# Patient Record
Sex: Female | Born: 1950
Health system: Southern US, Community
[De-identification: ages and names within clinical notes are randomized; demographics above are authoritative.]

## PROBLEM LIST (undated history)

## (undated) DIAGNOSIS — I10 Essential (primary) hypertension: Secondary | ICD-10-CM

## (undated) DIAGNOSIS — Z780 Asymptomatic menopausal state: Secondary | ICD-10-CM

## (undated) DIAGNOSIS — N261 Atrophy of kidney (terminal): Secondary | ICD-10-CM

## (undated) DIAGNOSIS — K222 Esophageal obstruction: Secondary | ICD-10-CM

## (undated) DIAGNOSIS — K611 Rectal abscess: Secondary | ICD-10-CM

## (undated) DIAGNOSIS — K5790 Diverticulosis of intestine, part unspecified, without perforation or abscess without bleeding: Secondary | ICD-10-CM

## (undated) DIAGNOSIS — N6019 Diffuse cystic mastopathy of unspecified breast: Secondary | ICD-10-CM

## (undated) DIAGNOSIS — K589 Irritable bowel syndrome without diarrhea: Secondary | ICD-10-CM

## (undated) DIAGNOSIS — N83209 Unspecified ovarian cyst, unspecified side: Secondary | ICD-10-CM

## (undated) DIAGNOSIS — K209 Esophagitis, unspecified without bleeding: Secondary | ICD-10-CM

## (undated) DIAGNOSIS — F419 Anxiety disorder, unspecified: Secondary | ICD-10-CM

## (undated) DIAGNOSIS — K635 Polyp of colon: Secondary | ICD-10-CM

## (undated) DIAGNOSIS — N280 Ischemia and infarction of kidney: Secondary | ICD-10-CM

## (undated) DIAGNOSIS — Z8719 Personal history of other diseases of the digestive system: Secondary | ICD-10-CM

## (undated) DIAGNOSIS — K5909 Other constipation: Secondary | ICD-10-CM

## (undated) DIAGNOSIS — K219 Gastro-esophageal reflux disease without esophagitis: Secondary | ICD-10-CM

## (undated) DIAGNOSIS — F32A Depression, unspecified: Secondary | ICD-10-CM

## (undated) DIAGNOSIS — M1990 Unspecified osteoarthritis, unspecified site: Secondary | ICD-10-CM

## (undated) DIAGNOSIS — M722 Plantar fascial fibromatosis: Secondary | ICD-10-CM

## (undated) DIAGNOSIS — N289 Disorder of kidney and ureter, unspecified: Secondary | ICD-10-CM

## (undated) DIAGNOSIS — J449 Chronic obstructive pulmonary disease, unspecified: Secondary | ICD-10-CM

## (undated) DIAGNOSIS — F329 Major depressive disorder, single episode, unspecified: Secondary | ICD-10-CM

## (undated) HISTORY — DX: Unspecified ovarian cyst, unspecified side: N83.209

## (undated) HISTORY — DX: Plantar fascial fibromatosis: M72.2

## (undated) HISTORY — DX: Asymptomatic menopausal state: Z78.0

## (undated) HISTORY — PX: DILATION AND CURETTAGE OF UTERUS: SHX78

## (undated) HISTORY — DX: Ischemia and infarction of kidney: N28.0

## (undated) HISTORY — DX: Irritable bowel syndrome, unspecified: K58.9

## (undated) HISTORY — DX: Polyp of colon: K63.5

## (undated) HISTORY — PX: EYE SURGERY: SHX253

## (undated) HISTORY — DX: Essential (primary) hypertension: I10

## (undated) HISTORY — PX: GASTROSTOMY: SHX151

## (undated) HISTORY — PX: BREAST BIOPSY: SHX20

---

## 2004-01-21 ENCOUNTER — Ambulatory Visit: Payer: Self-pay | Admitting: Internal Medicine

## 2004-01-29 ENCOUNTER — Ambulatory Visit: Payer: Self-pay | Admitting: Internal Medicine

## 2004-03-14 ENCOUNTER — Ambulatory Visit: Payer: Self-pay | Admitting: Internal Medicine

## 2004-07-03 ENCOUNTER — Ambulatory Visit: Payer: Self-pay

## 2004-07-28 ENCOUNTER — Ambulatory Visit: Payer: Self-pay

## 2004-09-02 ENCOUNTER — Ambulatory Visit: Payer: Self-pay

## 2004-12-09 ENCOUNTER — Ambulatory Visit: Payer: Self-pay | Admitting: Internal Medicine

## 2004-12-16 ENCOUNTER — Ambulatory Visit: Payer: Self-pay | Admitting: General Surgery

## 2004-12-17 ENCOUNTER — Ambulatory Visit: Payer: Self-pay | Admitting: General Surgery

## 2005-03-30 ENCOUNTER — Ambulatory Visit: Payer: Self-pay | Admitting: Internal Medicine

## 2006-02-11 ENCOUNTER — Ambulatory Visit: Payer: Self-pay | Admitting: Internal Medicine

## 2006-03-22 ENCOUNTER — Ambulatory Visit (HOSPITAL_COMMUNITY): Admission: RE | Admit: 2006-03-22 | Discharge: 2006-03-22 | Payer: Self-pay | Admitting: *Deleted

## 2006-03-31 ENCOUNTER — Ambulatory Visit: Payer: Self-pay | Admitting: Internal Medicine

## 2006-04-20 HISTORY — PX: CHOLECYSTECTOMY: SHX55

## 2007-02-07 ENCOUNTER — Ambulatory Visit: Payer: Self-pay | Admitting: Internal Medicine

## 2007-02-08 ENCOUNTER — Ambulatory Visit: Payer: Self-pay | Admitting: Internal Medicine

## 2007-02-21 ENCOUNTER — Ambulatory Visit: Payer: Self-pay | Admitting: Gastroenterology

## 2007-09-27 ENCOUNTER — Ambulatory Visit: Payer: Self-pay | Admitting: Internal Medicine

## 2008-08-08 ENCOUNTER — Encounter: Admission: RE | Admit: 2008-08-08 | Discharge: 2008-08-08 | Payer: Self-pay | Admitting: Neurosurgery

## 2009-03-01 ENCOUNTER — Ambulatory Visit: Payer: Self-pay | Admitting: Internal Medicine

## 2010-04-10 ENCOUNTER — Ambulatory Visit: Payer: Self-pay

## 2010-07-24 ENCOUNTER — Other Ambulatory Visit: Payer: Self-pay | Admitting: Podiatrist

## 2010-07-24 DIAGNOSIS — Z8781 Personal history of (healed) traumatic fracture: Secondary | ICD-10-CM

## 2010-07-31 ENCOUNTER — Other Ambulatory Visit: Payer: Self-pay | Admitting: Podiatrist

## 2010-07-31 ENCOUNTER — Ambulatory Visit
Admission: RE | Admit: 2010-07-31 | Discharge: 2010-07-31 | Disposition: A | Discharge status: Home / Self Care | Payer: BC Managed Care – PPO | Source: Ambulatory Visit | Attending: Podiatrist | Admitting: Podiatrist

## 2010-07-31 DIAGNOSIS — Z8781 Personal history of (healed) traumatic fracture: Secondary | ICD-10-CM

## 2010-07-31 MED ORDER — GADOBENATE DIMEGLUMINE 529 MG/ML IV SOLN
8.0000 mL | Freq: Once | INTRAVENOUS | Status: AC | PRN
  Administered 2010-07-31: 8 mL via INTRAVENOUS

## 2010-10-21 ENCOUNTER — Telehealth: Payer: Self-pay | Admitting: *Deleted

## 2010-10-21 MED ORDER — ESOMEPRAZOLE MAGNESIUM 40 MG PO CPDR: 40.0000 mg | DELAYED_RELEASE_CAPSULE | Freq: Every day | ORAL | Status: DC

## 2010-10-21 NOTE — Telephone Encounter (Signed)
Pt c/o diarrhea since on Dexalan, she d/c med x 2 weeks diarrhea stopped then she restarted it and diarrhea came back. Pt wants to try another med (mentioned Nexium) but preferably anything with a  with  generic.

## 2010-10-21 NOTE — Telephone Encounter (Signed)
Nexium rx sent.

## 2010-10-21 NOTE — Telephone Encounter (Signed)
Patient advised as instructed via telephone. 

## 2010-12-18 ENCOUNTER — Ambulatory Visit: Payer: Self-pay | Admitting: Rheumatology

## 2011-04-21 DIAGNOSIS — K635 Polyp of colon: Secondary | ICD-10-CM

## 2011-04-21 HISTORY — DX: Polyp of colon: K63.5

## 2011-10-18 ENCOUNTER — Ambulatory Visit: Payer: Self-pay | Admitting: Family Medicine

## 2011-11-17 ENCOUNTER — Ambulatory Visit: Payer: Self-pay | Admitting: Internal Medicine

## 2012-04-06 ENCOUNTER — Ambulatory Visit: Payer: BC Managed Care – PPO | Attending: Gynecologic Oncology | Admitting: Gynecologic Oncology

## 2012-04-06 ENCOUNTER — Telehealth: Payer: Self-pay | Admitting: Gynecologic Oncology

## 2012-04-06 ENCOUNTER — Encounter: Payer: Self-pay | Admitting: Gynecologic Oncology

## 2012-04-06 VITALS — BP 142/80 | HR 78 | Temp 97.8°F | Resp 22 | Ht 67.5 in | Wt 190.8 lb

## 2012-04-06 DIAGNOSIS — Z79899 Other long term (current) drug therapy: Secondary | ICD-10-CM | POA: Insufficient documentation

## 2012-04-06 DIAGNOSIS — F17200 Nicotine dependence, unspecified, uncomplicated: Secondary | ICD-10-CM | POA: Insufficient documentation

## 2012-04-06 DIAGNOSIS — N83209 Unspecified ovarian cyst, unspecified side: Secondary | ICD-10-CM | POA: Insufficient documentation

## 2012-04-06 DIAGNOSIS — N83299 Other ovarian cyst, unspecified side: Secondary | ICD-10-CM

## 2012-04-06 DIAGNOSIS — Z803 Family history of malignant neoplasm of breast: Secondary | ICD-10-CM | POA: Insufficient documentation

## 2012-04-06 NOTE — Telephone Encounter (Signed)
Called and spoke with the RN for Dr. Burney Gauze, the patient's primary care physician.  Informed that the patient would need to be arranged for an upper endoscopy and colonoscopy due to elevated CEA per Dr. Denman George recommendation.  Instructed to please fax the results to GYN Oncology.  Verbalizing understanding.

## 2012-04-06 NOTE — Patient Instructions (Signed)
Follow up with your endoscopy.

## 2012-04-06 NOTE — Progress Notes (Signed)
Consult Note: Gyn-Onc  Shannon Li 61 y.o. female  CC:  Chief Complaint  Patient presents with  . Complex Ovarian Cyst    New patient    HPI: Patient is seen today in consultation at the request of Dr. Juliene Pina.  Patient is a 61 year old gravida 0 who was seen by Dr. Juliene Pina December 3. Dr. Beverely Risen referred her for an ovarian cyst.  In June of 2013 the patient had a CT scan done at [facility] for presumed abdominal and right flank pain. She was noted to have mild atrophy of the right kidney for known renal artery stenosis. There is a 3 mm calculus in the distal right ureter. In addition there was a 1.6 cm cystic structure associated with the left ovary. There was a 1.1 cm cystic structure shows you with the right ovary. Both of these were compared to CT scan from October of 2008 and there were fairly unremarkable. There was mild diverticulosis. The appendix was normal. There is no evidence of any lymphadenopathy. She had followup ultrasound in August of 2013 which showed an anteverted uterus measuring 5.8 x 1.8 x 1.3 cm. The endometrial stripe measured 3 mm. The right ovary measured 2.6 x 2.3 x 1.5 cm with appropriate flow. The left ovary measured 3.3 x 2.5 x 2.3 cm with a possible septated complex cyst measuring 2.3 cm. There is no evidence of torsion. Repeat imaging November 19 again revealed complex left ovarian cyst measuring 2.6 x 2.8 cm. There is no free fluid. The right ovary was not seen. The cyst was not markedly enlarged. She was seen by her gynecologist and had tumor markers drawn which included a CA 125 of 12.5 and a CEA that was 8.4. The patient is a heavy smoker and the upper limit of normal for smokers is 5.6. It is for this reason that she comes in to see [place] today.  The patient states her colonoscopy back in about 2008.  Review of Systems: She has been menopausal since her 94s. She hormone replacement therapy about 2 years. She never had any bleeding.  Review of systems she has a blood in her stool or any vaginal bleeding. Her bowels voice been somewhat constipated and has been going on for a long time. If she takes Metamucil she just feels bloated without any significant relief in terms of her bowels. MiraLAX as not benefit her she occasionally takes laxatives. She 3-4 days between bowel movements. She's never noticed any blood in her stool. She did have one episode were were sheets and been had severe abdominal pain which resolved. She's not had any diarrhea as these last 2 days her stools have been somewhat loose. She states her stools are normal in caliber. She denies any weight changes any nausea, vomiting, fevers, chills, chest pain, shortness of breath. We discussed smoking cessation the patient is not particularly interested in smoking cessation. She denies any vaginal discharge. She has a change in her bladder habits. She denies any incontinence, hematuria or dysuria. She denies any unintentional weight loss or weight gain. She denies any early satiety. 10 point review of systems is otherwise negative.  Current Meds:  Outpatient Encounter Prescriptions as of 04/06/2012  Medication Sig Dispense Refill  . amLODipine (NORVASC) 2.5 MG tablet Take 2.5 mg by mouth daily.      . bisoprolol-hydrochlorothiazide (ZIAC) 2.5-6.25 MG per tablet Take 1 tablet by mouth daily.      Marland Kitchen escitalopram (LEXAPRO) 10 MG tablet Take 15  mg by mouth daily.      Marland Kitchen esomeprazole (NEXIUM) 40 MG capsule Take 1 capsule (40 mg total) by mouth daily.  30 capsule  1    Allergy:  Allergies  Allergen Reactions  . Penicillins Hives    Social Hx:   History   Social History  . Marital Status: Widowed    Spouse Name: N/A    Number of Children: N/A  . Years of Education: N/A   Occupational History  . Not on file.   Social History Main Topics  . Smoking status: Current Every Day Smoker -- 1.0 packs/day for 30 years    Types: Cigarettes  . Smokeless tobacco: Not on  file  . Alcohol Use: No  . Drug Use: No  . Sexually Active: Not on file   Other Topics Concern  . Not on file   Social History Narrative  . No narrative on file    Past Surgical Hx:  Past Surgical History  Procedure Date  . Cholecystectomy 2008    Past Medical Hx:  Past Medical History  Diagnosis Date  . Ovarian cyst   . Menopause   . Plantar fasciitis   . Renal artery occlusion   . Hypertension     Family Hx: The sister that she has with a breast cancer is actually a sister-in-law. She recently had a niece (her brothers daughter) diagnosed with breast cancer at age of 7. Family History  Problem Relation Age of Onset  . Heart disease Mother   . Heart attack Mother   . Diabetes Mother   . Alzheimer's disease Father   . Breast cancer Sister   . Lung cancer Brother   . Brain cancer Brother   . Lung cancer Sister   . Bladder Cancer Sister   . Lung cancer Brother   . Leukemia Brother   . Diabetes Brother     Vitals:  Blood pressure 142/80, pulse 78, temperature 97.8 F (36.6 C), resp. rate 22, height 5' 7.5" (1.715 m), weight 190 lb 12.8 oz (86.546 kg).  Physical Exam: Well-nourished well-developed female in no acute distress.  Neck: Supple, no lymphadenopathy, no thyromegaly.  Lungs: Clear to auscultation.  Cardiac Astro: Regular rate rhythm.  Abdomen: Soft, nontender, nondistended. There is no fluid wave. There is no hepatosplenomegaly. There is no masses.  Groins: No lymphadenopathy.  Extremities: No edema.  Pelvic: Normal external female genitalia. Bimanual examination the cervix is palpably normal. The corpus is of normal size shape and consistency. There is no adnexal masses appreciated. Rectovaginal examination confirms. There is no nodularity or masses.  Assessment/Plan: 61 year old with complex left ovarian cyst appears to be fairly stable over the last 6 months. CA 125 is normal however, her CEA is markedly abnormal. Before discussing surgical  prevention for the ovarian mass I agree with Dr. Alex Gardener and that she needs to proceed with both upper and lower endoscopy. We have contacted Dr. Milta Deiters office for assistance in scheduling these in [place] where she had her previous endoscopy performed. Should this be unremarkable, I discussed with her that she could continue with close followup which I would recommend to repeat ultrasound in 6 months or if she wished to proceed with surgical intervention that could also similarly be addressed at that time. I do not believe that this represents a malignancy and she has had a cyst on her ovaries documented as far back as 2008 with only about a centimeter and a half change in the past 5 years. I  believe this can be done by her gynecologist and can be done in a minimally invasive fashion. However, the patient understands this cannot be discussed until after her upper and lower endoscopy. She will wait for this to be done. We also discussed smoking cessation. We will followup with the results of her endoscopy and determine her disposition pending these results. This was all fully discussed with the patient and her sister-in-law. At her request she was given a copy of her tumor markers.  Shequilla Goodgame A., MD 04/06/2012, 10:28 AM

## 2012-04-20 HISTORY — PX: COLONOSCOPY: SHX174

## 2012-04-20 HISTORY — PX: UPPER GI ENDOSCOPY: SHX6162

## 2012-06-30 ENCOUNTER — Telehealth: Payer: Self-pay | Admitting: *Deleted

## 2012-06-30 NOTE — Telephone Encounter (Signed)
msg left for pt that Dr Duard Brady has reviewed the GI reports and recommends pt f/u with Dr Juliene Pina regarding ovarian cyst

## 2012-12-26 ENCOUNTER — Ambulatory Visit: Payer: Self-pay | Admitting: Physician Assistant

## 2013-11-01 ENCOUNTER — Ambulatory Visit: Payer: Self-pay | Admitting: Gastroenterology

## 2013-11-16 ENCOUNTER — Encounter: Payer: Self-pay | Admitting: General Surgery

## 2013-11-17 ENCOUNTER — Inpatient Hospital Stay: Payer: Self-pay | Admitting: General Surgery

## 2013-11-17 ENCOUNTER — Ambulatory Visit (INDEPENDENT_AMBULATORY_CARE_PROVIDER_SITE_OTHER): Payer: BC Managed Care – PPO | Admitting: General Surgery

## 2013-11-17 ENCOUNTER — Encounter: Payer: Self-pay | Admitting: General Surgery

## 2013-11-17 VITALS — BP 100/68 | HR 84 | Temp 101.2°F | Resp 14 | Ht 68.0 in | Wt 190.0 lb

## 2013-11-17 DIAGNOSIS — L03317 Cellulitis of buttock: Secondary | ICD-10-CM

## 2013-11-17 DIAGNOSIS — L0231 Cutaneous abscess of buttock: Secondary | ICD-10-CM | POA: Insufficient documentation

## 2013-11-17 DIAGNOSIS — K612 Anorectal abscess: Secondary | ICD-10-CM

## 2013-11-17 HISTORY — PX: ABSCESS DRAINAGE: SHX1119

## 2013-11-17 LAB — BASIC METABOLIC PANEL WITH GFR
Anion Gap: 7
BUN: 16 mg/dL
Calcium, Total: 8.5 mg/dL
Chloride: 101 mmol/L
Co2: 28 mmol/L
Creatinine: 1.62 mg/dL — ABNORMAL HIGH
EGFR (African American): 38 — ABNORMAL LOW
EGFR (Non-African Amer.): 33 — ABNORMAL LOW
Glucose: 117 mg/dL — ABNORMAL HIGH
Osmolality: 274
Potassium: 3.4 mmol/L — ABNORMAL LOW
Sodium: 136 mmol/L

## 2013-11-17 LAB — CBC WITH DIFFERENTIAL/PLATELET
Basophil #: 0.1 10*3/uL
Basophil %: 0.4 %
Eosinophil #: 0 10*3/uL
Eosinophil %: 0 %
HCT: 41.8 %
HGB: 14.2 g/dL
Lymphocyte %: 3.9 %
Lymphs Abs: 0.7 10*3/uL — ABNORMAL LOW
MCH: 30.5 pg
MCHC: 33.9 g/dL
MCV: 90 fL
Monocyte #: 1.1 10*3/uL — ABNORMAL HIGH
Monocyte %: 6.2 %
Neutrophil #: 15.4 10*3/uL — ABNORMAL HIGH
Neutrophil %: 89.5 %
Platelet: 209 10*3/uL
RBC: 4.66 X10 6/mm 3
RDW: 14.3 %
WBC: 17.2 10*3/uL — ABNORMAL HIGH

## 2013-11-17 NOTE — Progress Notes (Signed)
Patient ID: Shannon Li, female   DOB: 06/02/1950, 64 y.o.   MRN: [id number]  Chief Complaint  Patient presents with  . Other    Abscess on buttocks    HPI Shannon Li is a 63 y.o. female who presents for an evaluation of an abscess on the buttocks. The patient states the abscess has been present for 3 days. She is currently on antibiotics. She complains of pain that is constant. She has had a fever for the last 2 days as well as some nausea. She currently has a fever. No drainage.  The patient reports no change in her bowel habits. She has a long history of IBS. She reports a usual single loose stool in the morning with a sense of incomplete emptying. No unusual pelvic or perirectal pain prior to the present episode. No episode of constipation that she recalls. No history of anorectal trauma.  HPI  Past Medical History  Diagnosis Date  . Ovarian cyst   . Menopause   . Plantar fasciitis   . Renal artery occlusion   . Hypertension   . IBS (irritable bowel syndrome)     Past Surgical History  Procedure Laterality Date  . Cholecystectomy  2008    Family History  Problem Relation Age of Onset  . Heart disease Mother   . Heart attack Mother   . Diabetes Mother   . Alzheimer's disease Father   . Breast cancer Sister   . Lung cancer Brother   . Brain cancer Brother   . Lung cancer Sister   . Bladder Cancer Sister   . Lung cancer Brother   . Leukemia Brother   . Diabetes Brother     Social History History  Substance Use Topics  . Smoking status: Current Every Day Smoker -- 1.00 packs/day for 30 years    Types: Cigarettes  . Smokeless tobacco: Not on file  . Alcohol Use: No    Allergies  Allergen Reactions  . Penicillins Hives    Current Outpatient Prescriptions  Medication Sig Dispense Refill  . amLODipine (NORVASC) 2.5 MG tablet Take 2.5 mg by mouth daily.      . bisoprolol-hydrochlorothiazide (ZIAC) 2.5-6.25 MG per tablet Take 1 tablet by mouth  daily.      Marland Kitchen escitalopram (LEXAPRO) 10 MG tablet Take 15 mg by mouth daily.      Marland Kitchen esomeprazole (NEXIUM) 40 MG capsule Take 1 capsule (40 mg total) by mouth daily.  30 capsule  1  . Eszopiclone (ESZOPICLONE) 3 MG TABS Take 3 mg by mouth at bedtime. Take immediately before bedtime      . Linaclotide (LINZESS PO) Take by mouth.      . pantoprazole (PROTONIX) 40 MG tablet Take 40 mg by mouth daily.      Marland Kitchen sulfamethoxazole-trimethoprim (BACTRIM DS,SEPTRA DS) 800-160 MG per tablet Take 1 tablet by mouth 2 (two) times daily.       No current facility-administered medications for this visit.    Review of Systems Review of Systems  Constitutional: Positive for fever and chills.  Respiratory: Negative.   Cardiovascular: Negative.   Gastrointestinal: Positive for nausea.    Blood pressure 100/68, pulse 84, temperature 101.2 F (38.4 C), temperature source Oral, resp. rate 14, height 5\' 8"  (1.727 m), weight 190 lb (86.183 kg).  Physical Exam Physical Exam  Constitutional: She is oriented to person, place, and time. She appears well-developed and well-nourished.  Cardiovascular: Normal rate, regular rhythm and normal heart sounds.  No murmur heard. Pulmonary/Chest: Effort normal and breath sounds normal.  Abdominal: Soft. Normal appearance and bowel sounds are normal. There is no hepatosplenomegaly. There is no tenderness. No hernia.  Genitourinary:     7cm area of fullness and errythemia.   Neurological: She is alert and oriented to person, place, and time.  Skin: Skin is warm and dry.    Data Reviewed PCP notes.  CT of the abdomen and pelvis obtained by the GI service 11/01/2048 was reviewed. No evidence of perirectal inflammation identified on that study. Assessment    Perirectal abscess.    Plan    In light of the patient's fever, sizable area of erythema and likely deep location of the abscess incision and drainage under anesthesia is been recommended. She had serial at  7:30 AM and in discussion with Inge Rise, MD from anesthesia with will need to postpone the procedure until approximately 3 PM. She'll be admitted for observation, IV M. biotics initiated, IV pain medications in preparation for surgical drainage later today.     PCP: Lonna Duval 11/17/2013, 9:14 AM

## 2013-11-17 NOTE — Patient Instructions (Signed)
Patient to be scheduled for scheduled. The patient is aware to call back for any questions or concerns.

## 2013-11-19 LAB — CBC WITH DIFFERENTIAL/PLATELET
Basophil #: 0 10*3/uL
Basophil %: 0.4 %
Eosinophil #: 0 10*3/uL
Eosinophil %: 0.3 %
HCT: 34.4 % — ABNORMAL LOW
HGB: 11.6 g/dL — ABNORMAL LOW
Lymphocyte %: 7.8 %
Lymphs Abs: 0.9 10*3/uL — ABNORMAL LOW
MCH: 30.4 pg
MCHC: 33.6 g/dL
MCV: 90 fL
Monocyte #: 0.5 10*3/uL
Monocyte %: 4.2 %
Neutrophil #: 10.6 10*3/uL — ABNORMAL HIGH
Neutrophil %: 87.3 %
Platelet: 194 10*3/uL
RBC: 3.81 X10 6/mm 3
RDW: 14.3 %
WBC: 12.1 10*3/uL — ABNORMAL HIGH

## 2013-11-20 ENCOUNTER — Encounter: Payer: Self-pay | Admitting: General Surgery

## 2013-11-21 ENCOUNTER — Encounter: Payer: Self-pay | Admitting: General Surgery

## 2013-11-22 ENCOUNTER — Encounter: Payer: Self-pay | Admitting: General Surgery

## 2013-11-22 ENCOUNTER — Ambulatory Visit (INDEPENDENT_AMBULATORY_CARE_PROVIDER_SITE_OTHER): Payer: Self-pay | Admitting: General Surgery

## 2013-11-22 VITALS — BP 130/68 | HR 68 | Temp 97.6°F | Resp 12 | Ht 66.0 in | Wt 197.0 lb

## 2013-11-22 DIAGNOSIS — L0231 Cutaneous abscess of buttock: Secondary | ICD-10-CM

## 2013-11-22 DIAGNOSIS — L03317 Cellulitis of buttock: Secondary | ICD-10-CM

## 2013-11-22 LAB — WOUND CULTURE

## 2013-11-22 NOTE — Progress Notes (Signed)
Patient ID: Shannon Li, female   DOB: 1950/06/05, 63 y.o.   MRN: [id number]  Chief Complaint  Patient presents with  . Follow-up    perirectal abcess    HPI Shannon Li is a 63 y.o. female here today for her follow up perirectal abscess drainage done at [facility] on 11/17/13. Patient states the area very sore and still draining.Bilateral leg swelling. She is experiencing dyspnea with exertion. No chest pain. HPI  Past Medical History  Diagnosis Date  . Ovarian cyst   . Menopause   . Plantar fasciitis   . Renal artery occlusion   . Hypertension   . IBS (irritable bowel syndrome)     Past Surgical History  Procedure Laterality Date  . Cholecystectomy  2008  . Abscess drainage  11/17/13    buttock right    Family History  Problem Relation Age of Onset  . Heart disease Mother   . Heart attack Mother   . Diabetes Mother   . Alzheimer's disease Father   . Breast cancer Sister   . Lung cancer Brother   . Brain cancer Brother   . Lung cancer Sister   . Bladder Cancer Sister   . Lung cancer Brother   . Leukemia Brother   . Diabetes Brother     Social History History  Substance Use Topics  . Smoking status: Current Every Day Smoker -- 1.00 packs/day for 30 years    Types: Cigarettes  . Smokeless tobacco: Not on file  . Alcohol Use: No    Allergies  Allergen Reactions  . Bupropion Nausea And Vomiting  . Penicillins Hives    Current Outpatient Prescriptions  Medication Sig Dispense Refill  . bisoprolol-hydrochlorothiazide (ZIAC) 2.5-6.25 MG per tablet Take 1 tablet by mouth daily.      Marland Kitchen escitalopram (LEXAPRO) 10 MG tablet Take 15 mg by mouth daily.      . Eszopiclone (ESZOPICLONE) 3 MG TABS Take 3 mg by mouth at bedtime. Take immediately before bedtime      . Linaclotide (LINZESS PO) Take by mouth.      . pantoprazole (PROTONIX) 40 MG tablet Take 40 mg by mouth daily.      Marland Kitchen sulfamethoxazole-trimethoprim (BACTRIM DS,SEPTRA DS) 800-160 MG per tablet Take 1  tablet by mouth 2 (two) times daily.      Marland Kitchen amLODipine (NORVASC) 2.5 MG tablet Take 2.5 mg by mouth daily.      Marland Kitchen esomeprazole (NEXIUM) 40 MG capsule Take 1 capsule (40 mg total) by mouth daily.  30 capsule  1   No current facility-administered medications for this visit.    Review of Systems Review of Systems  Constitutional: Negative.   Respiratory: Negative.   Cardiovascular: Negative.     Blood pressure 130/68, pulse 68, temperature 97.6 F (36.4 C), resp. rate 12, height 5\' 6"  (1.676 m), weight 197 lb (89.359 kg), SpO2 97.00%.  Physical Exam Physical Exam  Constitutional: She is oriented to person, place, and time. She appears well-developed and well-nourished.  Eyes: Conjunctivae are normal.  Neck: Neck supple.  Cardiovascular: Normal rate, regular rhythm and normal heart sounds.   Pulmonary/Chest: Effort normal.  Abdominal: Soft. Normal appearance and bowel sounds are normal. There is no tenderness.  Neurological: She is alert and oriented to person, place, and time.  Skin: Skin is warm and dry.    Data Reviewed Cultures showed rare Candida, light growth of parabacteroides and moderate growth of an anaerobic gram-negative rod which was not further identified.  WBC 3.4 - 10.8 x10E3/uL 8.0       RBC 3.77 - 5.28 x10E6/uL 4.29       Hemoglobin 11.1 - 15.9 g/dL 12.8       HCT 34.0 - 46.6 % 38.4       MCV 79 - 97 fL 90       MCH 26.6 - 33.0 pg 29.8       MCHC 31.5 - 35.7 g/dL 33.3       RDW 12.3 - 15.4 % 14.6       Platelets 150 - 379 x10E3/uL 427 (H)       Neutrophils Relative % % 57       Lymphs % 27       Monocytes % 11       Eos % 3       Basos % 1       Neutrophils Absolute 1.4 - 7.0 x10E3/uL 4.6       Lymphocytes Absolute 0.7 - 3.1 x10E3/uL 2.2       Monocytes Absolute 0.1 - 0.9 x10E3/uL 0.9       Eosinophils Absolute 0.0 - 0.4 x10E3/uL 0.2       Basophils Absolute 0.0 - 0.2 x10E3/uL 0.0       Immature Granulocytes % 1       Immature  Grans (Abs) 0.0 - 0.1 x10E3/uL 0.1       Resulting Agency [facility]            Result Narrative      Assessment    Perirectal abscess, improving status post drainage. Polymicrobial infection. Lower extremity edema secondary to large volume of fluid administered during her initial 24-hour postbiopsy when she showed evidence of systemic inflammatory response syndrome.  No evidence of congestive heart failure.    Plan    Patient was sent to [facility] lab today to have the following drawn: CBC with differential.  We'll plan for a followup examination in one week. Drains were left in place. The patient will resume Norvasc based on her stable blood pressure today.    PCP: Vanetta Shawl 11/24/2013, 8:56 AM

## 2013-11-22 NOTE — Patient Instructions (Addendum)
Patient to have labs drawn at [facility] lab today.Patient to return one weeks

## 2013-11-23 ENCOUNTER — Encounter: Payer: Self-pay | Admitting: General Surgery

## 2013-11-23 LAB — CBC WITH DIFFERENTIAL
Basophils Absolute: 0 10*3/uL (ref 0.0–0.2)
Basos: 1 %
Eos: 3 %
Eosinophils Absolute: 0.2 10*3/uL (ref 0.0–0.4)
HCT: 38.4 % (ref 34.0–46.6)
Hemoglobin: 12.8 g/dL (ref 11.1–15.9)
Immature Grans (Abs): 0.1 10*3/uL (ref 0.0–0.1)
Immature Granulocytes: 1 %
Lymphocytes Absolute: 2.2 10*3/uL (ref 0.7–3.1)
Lymphs: 27 %
MCH: 29.8 pg (ref 26.6–33.0)
MCHC: 33.3 g/dL (ref 31.5–35.7)
MCV: 90 fL (ref 79–97)
Monocytes Absolute: 0.9 10*3/uL (ref 0.1–0.9)
Monocytes: 11 %
Neutrophils Absolute: 4.6 10*3/uL (ref 1.4–7.0)
Neutrophils Relative %: 57 %
Platelets: 427 10*3/uL — ABNORMAL HIGH (ref 150–379)
RBC: 4.29 x10E6/uL (ref 3.77–5.28)
RDW: 14.6 % (ref 12.3–15.4)
WBC: 8 10*3/uL (ref 3.4–10.8)

## 2013-11-27 ENCOUNTER — Ambulatory Visit (INDEPENDENT_AMBULATORY_CARE_PROVIDER_SITE_OTHER): Payer: Self-pay | Admitting: General Surgery

## 2013-11-27 ENCOUNTER — Encounter: Payer: Self-pay | Admitting: General Surgery

## 2013-11-27 VITALS — BP 104/70 | HR 78 | Resp 12 | Ht 66.0 in | Wt 189.0 lb

## 2013-11-27 DIAGNOSIS — L03317 Cellulitis of buttock: Secondary | ICD-10-CM

## 2013-11-27 DIAGNOSIS — L0231 Cutaneous abscess of buttock: Secondary | ICD-10-CM

## 2013-11-27 NOTE — Progress Notes (Signed)
Patient ID: Shannon Li, female   DOB: 03/07/1951, 63 y.o.   MRN: [id number]  Chief Complaint  Patient presents with  . Follow-up    perianal abscess    HPI Shannon Li is a 63 y.o. female here today for her follow up perirectal abscess drainage done at [facility] on 11/17/13. The patient states she is having a little drainage still but has decreased significantly. No new problems at this time. Patient doing well.  The patient reports a significant improvement in her perineal pain since her last visit.  HPI  Past Medical History  Diagnosis Date  . Ovarian cyst   . Menopause   . Plantar fasciitis   . Renal artery occlusion   . Hypertension   . IBS (irritable bowel syndrome)     Past Surgical History  Procedure Laterality Date  . Cholecystectomy  2008  . Abscess drainage  11/17/13    buttock right    Family History  Problem Relation Age of Onset  . Heart disease Mother   . Heart attack Mother   . Diabetes Mother   . Alzheimer's disease Father   . Breast cancer Sister   . Lung cancer Brother   . Brain cancer Brother   . Lung cancer Sister   . Bladder Cancer Sister   . Lung cancer Brother   . Leukemia Brother   . Diabetes Brother     Social History History  Substance Use Topics  . Smoking status: Current Every Day Smoker -- 1.00 packs/day for 30 years    Types: Cigarettes  . Smokeless tobacco: Not on file  . Alcohol Use: No    Allergies  Allergen Reactions  . Bupropion Nausea And Vomiting  . Penicillins Hives    Current Outpatient Prescriptions  Medication Sig Dispense Refill  . amLODipine (NORVASC) 2.5 MG tablet Take 2.5 mg by mouth daily.      . bisoprolol-hydrochlorothiazide (ZIAC) 2.5-6.25 MG per tablet Take 1 tablet by mouth daily.      Marland Kitchen escitalopram (LEXAPRO) 10 MG tablet Take 15 mg by mouth daily.      . Eszopiclone (ESZOPICLONE) 3 MG TABS Take 3 mg by mouth at bedtime. Take immediately before bedtime      . Linaclotide (LINZESS PO) Take by  mouth.      . pantoprazole (PROTONIX) 40 MG tablet Take 40 mg by mouth daily.      Marland Kitchen sulfamethoxazole-trimethoprim (BACTRIM DS,SEPTRA DS) 800-160 MG per tablet Take 1 tablet by mouth 2 (two) times daily.      Marland Kitchen esomeprazole (NEXIUM) 40 MG capsule Take 1 capsule (40 mg total) by mouth daily.  30 capsule  1   No current facility-administered medications for this visit.    Review of Systems Review of Systems  Constitutional: Negative.   Respiratory: Negative.   Cardiovascular: Negative.   Gastrointestinal: Negative.     Blood pressure 104/70, pulse 78, resp. rate 12, height 5\' 6"  (1.676 m), weight 189 lb (85.73 kg).  Physical Exam Physical Exam  Constitutional: She is oriented to person, place, and time. She appears well-developed and well-nourished.  Genitourinary:     Neurological: She is alert and oriented to person, place, and time.  Skin: Skin is warm and dry.     Assessment    Good progress status post drainage of perirectal abscess.     Plan    Drains were removed without incident. The patient has been advised that ongoing drainage may be present for the next  week or 2. She completed her present course of Bactrim. The possibility of late identification of a fistula in ano was discussed. We'll plan for a followup examination next 1-2 weeks.     PCP: Vanetta Shawl 11/27/2013, 8:46 PM

## 2013-11-27 NOTE — Patient Instructions (Signed)
Patient to return in 2 weeks for follow up. The patient is aware to call back for any questions or concerns.  

## 2013-12-11 ENCOUNTER — Ambulatory Visit (INDEPENDENT_AMBULATORY_CARE_PROVIDER_SITE_OTHER): Payer: Self-pay | Admitting: General Surgery

## 2013-12-11 ENCOUNTER — Encounter: Payer: Self-pay | Admitting: General Surgery

## 2013-12-11 VITALS — BP 110/70 | HR 76 | Resp 12 | Ht 63.0 in | Wt 191.0 lb

## 2013-12-11 DIAGNOSIS — L0231 Cutaneous abscess of buttock: Secondary | ICD-10-CM

## 2013-12-11 DIAGNOSIS — L03317 Cellulitis of buttock: Secondary | ICD-10-CM

## 2013-12-11 NOTE — Patient Instructions (Signed)
Patient to return in 1 month for follow up. Patient advised she can use a heating pad for comfort.

## 2013-12-11 NOTE — Progress Notes (Signed)
Patient ID: Shannon Li, female   DOB: [birth year], 63 y.o.   MRN: [id number]  Chief Complaint  Patient presents with  . Other    peri anal abscess    HPI Shannon Li is a 63 y.o. female here today for her follow up perirectal abscess drainage done at [facility] on 11/17/13. The patient states she still has an area that is draining. Otherwise she is doing well.   HPI  Past Medical History  Diagnosis Date  . Ovarian cyst   . Menopause   . Plantar fasciitis   . Renal artery occlusion   . Hypertension   . IBS (irritable bowel syndrome)     Past Surgical History  Procedure Laterality Date  . Cholecystectomy  2008  . Abscess drainage  11/17/13    buttock right    Family History  Problem Relation Age of Onset  . Heart disease Mother   . Heart attack Mother   . Diabetes Mother   . Alzheimer's disease Father   . Breast cancer Sister   . Lung cancer Brother   . Brain cancer Brother   . Lung cancer Sister   . Bladder Cancer Sister   . Lung cancer Brother   . Leukemia Brother   . Diabetes Brother     Social History History  Substance Use Topics  . Smoking status: Current Every Day Smoker -- 1.00 packs/day for 30 years    Types: Cigarettes  . Smokeless tobacco: Not on file  . Alcohol Use: No    Allergies  Allergen Reactions  . Bupropion Nausea And Vomiting  . Penicillins Hives    Current Outpatient Prescriptions  Medication Sig Dispense Refill  . amLODipine (NORVASC) 2.5 MG tablet Take 2.5 mg by mouth daily.      . bisoprolol-hydrochlorothiazide (ZIAC) 2.5-6.25 MG per tablet Take 1 tablet by mouth daily.      Marland Kitchen escitalopram (LEXAPRO) 10 MG tablet Take 15 mg by mouth daily.      . Eszopiclone (ESZOPICLONE) 3 MG TABS Take 3 mg by mouth at bedtime. Take immediately before bedtime      . Linaclotide (LINZESS PO) Take by mouth.      . pantoprazole (PROTONIX) 40 MG tablet Take 40 mg by mouth daily.      Marland Kitchen sulfamethoxazole-trimethoprim (BACTRIM DS,SEPTRA DS) 800-160  MG per tablet Take 1 tablet by mouth 2 (two) times daily.      Marland Kitchen esomeprazole (NEXIUM) 40 MG capsule Take 1 capsule (40 mg total) by mouth daily.  30 capsule  1   No current facility-administered medications for this visit.    Review of Systems Review of Systems  Constitutional: Negative.   Respiratory: Negative.   Cardiovascular: Negative.     Blood pressure 110/70, pulse 76, resp. rate 12, height 5\' 3"  (1.6 m), weight 191 lb (86.637 kg).  Physical Exam Physical Exam  Constitutional: She is oriented to person, place, and time. She appears well-developed and well-nourished.  Neurological: She is alert and oriented to person, place, and time.  Skin: Skin is warm and dry.  The ellipse of skin removed from the abscess wall remains open. Probing shows a narrow cavity no more than 4 cm in depth with no loculated fluid. Complete Roh solution a previously identified erythema, 75+ percent resolution of induration.  Data Reviewed Culture showed rare Candida species,parabacteroides distasonis, unidentified gram-negative rod.  Assessment    Doing well status post incision and drainage of perirectal abscess.     Plan  The patient was reassured that the drainage will indeed stop. We'll plan for a followup examination in one month.     PCP: Vanetta Shawl 12/12/2013, 1:53 PM

## 2014-01-02 ENCOUNTER — Ambulatory Visit (INDEPENDENT_AMBULATORY_CARE_PROVIDER_SITE_OTHER): Payer: Self-pay | Admitting: General Surgery

## 2014-01-02 ENCOUNTER — Encounter: Payer: Self-pay | Admitting: General Surgery

## 2014-01-02 VITALS — BP 122/86 | HR 76 | Temp 97.1°F | Resp 16 | Ht 68.0 in | Wt 188.0 lb

## 2014-01-02 DIAGNOSIS — L0231 Cutaneous abscess of buttock: Secondary | ICD-10-CM

## 2014-01-02 DIAGNOSIS — L03317 Cellulitis of buttock: Secondary | ICD-10-CM

## 2014-01-02 NOTE — Progress Notes (Signed)
Patient ID: Shannon Li, female   DOB: [birth year], 63 y.o.   MRN: [id number]  Chief Complaint  Patient presents with  . Follow-up    1 month follow up peri rectal abscess    HPI Shannon Li is a 63 y.o. female who presents for a 1 month follow up of a peri rectal abscess. The procedure was performed on 11/17/13. The patient states she feels like she has 4-5 areas that have popped up since last Friday 12/29/13. She states she has an odor in this area after urinating as well as some tenderness. She also has had some itching in this area. She denies any drainage at this time.    HPI  Past Medical History  Diagnosis Date  . Ovarian cyst   . Menopause   . Plantar fasciitis   . Renal artery occlusion   . Hypertension   . IBS (irritable bowel syndrome)     Past Surgical History  Procedure Laterality Date  . Cholecystectomy  2008  . Abscess drainage  11/17/13    buttock right    Family History  Problem Relation Age of Onset  . Heart disease Mother   . Heart attack Mother   . Diabetes Mother   . Alzheimer's disease Father   . Breast cancer Sister   . Lung cancer Brother   . Brain cancer Brother   . Lung cancer Sister   . Bladder Cancer Sister   . Lung cancer Brother   . Leukemia Brother   . Diabetes Brother     Social History History  Substance Use Topics  . Smoking status: Current Every Day Smoker -- 1.00 packs/day for 30 years    Types: Cigarettes  . Smokeless tobacco: Not on file  . Alcohol Use: No    Allergies  Allergen Reactions  . Bupropion Nausea And Vomiting  . Penicillins Hives    Current Outpatient Prescriptions  Medication Sig Dispense Refill  . amLODipine (NORVASC) 2.5 MG tablet Take 2.5 mg by mouth daily.      . bisoprolol-hydrochlorothiazide (ZIAC) 2.5-6.25 MG per tablet Take 1 tablet by mouth daily.      Marland Kitchen escitalopram (LEXAPRO) 10 MG tablet Take 15 mg by mouth daily.      . Eszopiclone (ESZOPICLONE) 3 MG TABS Take 3 mg by mouth at  bedtime. Take immediately before bedtime      . pantoprazole (PROTONIX) 40 MG tablet Take 40 mg by mouth daily.      Marland Kitchen esomeprazole (NEXIUM) 40 MG capsule Take 1 capsule (40 mg total) by mouth daily.  30 capsule  1   No current facility-administered medications for this visit.    Review of Systems Review of Systems  Constitutional: Negative.   Respiratory: Negative.   Cardiovascular: Negative.   Gastrointestinal: Negative.     Blood pressure 122/86, pulse 76, temperature 97.1 F (36.2 C), temperature source Oral, resp. rate 16, height 5\' 8"  (1.727 m), weight 188 lb (85.276 kg).  Physical Exam Physical Exam  Constitutional: She is oriented to person, place, and time. She appears well-developed and well-nourished.  Genitourinary:     Well healed incision site.   Neurological: She is alert and oriented to person, place, and time.  Skin: Skin is warm and dry.    Data Reviewed Urine dipstick was negative.  Assessment    No evidence of recurrent perirectal abscess.     Plan    We'll plan for a final follow up examination one month.  PCP/Ref MD: Vanetta Shawl 01/02/2014, 7:43 PM

## 2014-01-02 NOTE — Patient Instructions (Signed)
Patient to return in 1 month for follow up. The patient is aware to call back for any questions or concerns.  

## 2014-01-10 ENCOUNTER — Ambulatory Visit: Payer: BC Managed Care – PPO | Admitting: General Surgery

## 2014-01-31 ENCOUNTER — Encounter: Payer: Self-pay | Admitting: General Surgery

## 2014-01-31 ENCOUNTER — Ambulatory Visit (INDEPENDENT_AMBULATORY_CARE_PROVIDER_SITE_OTHER): Payer: BC Managed Care – PPO | Admitting: General Surgery

## 2014-01-31 VITALS — BP 132/74 | HR 74 | Resp 14 | Ht 68.0 in | Wt 190.0 lb

## 2014-01-31 DIAGNOSIS — L0231 Cutaneous abscess of buttock: Secondary | ICD-10-CM

## 2014-01-31 NOTE — Progress Notes (Signed)
Patient ID: Shannon Li, female   DOB: 1951/01/07, 63 y.o.   MRN: [id number]  Chief Complaint  Patient presents with  . Follow-up    abscess    HPI Shannon Li is a 63 y.o. female.  Here today for follow up, perirectal abscess. She states she is much better and no drainage or pain. Bowels are normal and move typically every other day.  HPI  Past Medical History  Diagnosis Date  . Ovarian cyst   . Menopause   . Plantar fasciitis   . Renal artery occlusion   . Hypertension   . IBS (irritable bowel syndrome)     Past Surgical History  Procedure Laterality Date  . Cholecystectomy  2008  . Abscess drainage  11/17/13    buttock right    Family History  Problem Relation Age of Onset  . Heart disease Mother   . Heart attack Mother   . Diabetes Mother   . Alzheimer's disease Father   . Breast cancer Sister   . Lung cancer Brother   . Brain cancer Brother   . Lung cancer Sister   . Bladder Cancer Sister   . Lung cancer Brother   . Leukemia Brother   . Diabetes Brother     Social History History  Substance Use Topics  . Smoking status: Current Every Day Smoker -- 1.00 packs/day for 30 years    Types: Cigarettes  . Smokeless tobacco: Not on file  . Alcohol Use: No    Allergies  Allergen Reactions  . Bupropion Nausea And Vomiting  . Penicillins Hives    Current Outpatient Prescriptions  Medication Sig Dispense Refill  . amLODipine (NORVASC) 2.5 MG tablet Take 2.5 mg by mouth daily.      . bisoprolol-hydrochlorothiazide (ZIAC) 2.5-6.25 MG per tablet Take 1 tablet by mouth daily.      Marland Kitchen escitalopram (LEXAPRO) 10 MG tablet Take 15 mg by mouth daily.      . Eszopiclone (ESZOPICLONE) 3 MG TABS Take 3 mg by mouth at bedtime. Take immediately before bedtime      . pantoprazole (PROTONIX) 40 MG tablet Take 40 mg by mouth daily.       No current facility-administered medications for this visit.    Review of Systems Review of Systems  Constitutional:  Negative.   Respiratory: Negative.   Cardiovascular: Negative.     Blood pressure 132/74, pulse 74, resp. rate 14, height 5\' 8"  (1.727 m), weight 190 lb (86.183 kg).  Physical Exam Physical Exam  Constitutional: She is oriented to person, place, and time. She appears well-developed and well-nourished.  Genitourinary:  Drain site is well healed.  Neurological: She is alert and oriented to person, place, and time.  Skin: Skin is warm.  No evidence of induration, thickening or tenderness in the right gluteal tissue.     Assessment    Complete resolution of right perirectal abscess.     Plan    Patient to return as needed.     PCP: Vanetta Shawl 02/01/2014, 5:49 AM

## 2014-02-12 ENCOUNTER — Ambulatory Visit: Payer: Self-pay | Admitting: Internal Medicine

## 2014-05-07 ENCOUNTER — Telehealth: Payer: Self-pay

## 2014-05-07 DIAGNOSIS — H00036 Abscess of eyelid left eye, unspecified eyelid: Secondary | ICD-10-CM

## 2014-05-07 MED ORDER — SULFAMETHOXAZOLE-TRIMETHOPRIM 800-160 MG PO TABS: 1.0000 | ORAL_TABLET | Freq: Two times a day (BID) | ORAL | Status: DC

## 2014-05-07 NOTE — Telephone Encounter (Signed)
Right peri-rectal abscess in July 2015. Required I&D. Will RX w/ Bactrim DS: 1 po BID x 10 days and arrange for office follow up.

## 2014-05-07 NOTE — Telephone Encounter (Signed)
Patient called and states that she had developed another abscess, this time it is around her left groin area. She states that she used a warm compress and the white head ruptured yesterday. She states that today the area is still very red and sore. She wanted to know if she would be able to have an antibiotic called in, or if their was anything she could to for the area.

## 2014-05-21 ENCOUNTER — Ambulatory Visit (INDEPENDENT_AMBULATORY_CARE_PROVIDER_SITE_OTHER): Payer: PPO | Admitting: General Surgery

## 2014-05-21 ENCOUNTER — Encounter: Payer: Self-pay | Admitting: General Surgery

## 2014-05-21 VITALS — BP 134/68 | HR 74 | Resp 14 | Ht 68.0 in | Wt 189.0 lb

## 2014-05-21 DIAGNOSIS — L02214 Cutaneous abscess of groin: Secondary | ICD-10-CM

## 2014-05-21 LAB — GLUCOSE, POCT (MANUAL RESULT ENTRY): POC Glucose: 98 mg/dL (ref 70–99)

## 2014-05-21 NOTE — Progress Notes (Signed)
Patient ID: Shannon Li, female   DOB: 02/11/1951, 64 y.o.   MRN: [id number]  Chief Complaint  Patient presents with  . Other    infected left groin     HPI Shannon Li is a 64 y.o. female here today for a evaluation of a left groin abscess. Patient states she noticed this area three weeks ago. She was put on Sulfamethoxazole for 10 days. The area drained on 04/1814. She states the area is now nearly resolved. HPI  Past Medical History  Diagnosis Date  . Ovarian cyst   . Menopause   . Plantar fasciitis   . Renal artery occlusion   . Hypertension   . IBS (irritable bowel syndrome)     Past Surgical History  Procedure Laterality Date  . Cholecystectomy  2008  . Abscess drainage  11/17/13    buttock right    Family History  Problem Relation Age of Onset  . Heart disease Mother   . Heart attack Mother   . Diabetes Mother   . Alzheimer's disease Father   . Breast cancer Sister   . Lung cancer Brother   . Brain cancer Brother   . Lung cancer Sister   . Bladder Cancer Sister   . Lung cancer Brother   . Leukemia Brother   . Diabetes Brother     Social History History  Substance Use Topics  . Smoking status: Current Every Day Smoker -- 1.00 packs/day for 30 years    Types: Cigarettes  . Smokeless tobacco: Not on file  . Alcohol Use: No    Allergies  Allergen Reactions  . Bupropion Nausea And Vomiting  . Penicillins Hives    Current Outpatient Prescriptions  Medication Sig Dispense Refill  . amLODipine (NORVASC) 2.5 MG tablet Take 2.5 mg by mouth daily.    . bisoprolol-hydrochlorothiazide (ZIAC) 2.5-6.25 MG per tablet Take 1 tablet by mouth daily.    Marland Kitchen escitalopram (LEXAPRO) 10 MG tablet Take 15 mg by mouth daily.    . Eszopiclone (ESZOPICLONE) 3 MG TABS Take 3 mg by mouth at bedtime. Take immediately before bedtime    . pantoprazole (PROTONIX) 40 MG tablet Take 40 mg by mouth daily.    Marland Kitchen sulfamethoxazole-trimethoprim (BACTRIM DS,SEPTRA DS) 800-160 MG  per tablet Take 1 tablet by mouth 2 (two) times daily. 20 tablet 1   No current facility-administered medications for this visit.    Review of Systems Review of Systems  Constitutional: Negative.   Respiratory: Negative.   Cardiovascular: Negative.     Blood pressure 134/68, pulse 74, resp. rate 14, height 5\' 8"  (1.727 m), weight 189 lb (85.73 kg).  Physical Exam Physical Exam  Constitutional: She is oriented to person, place, and time. She appears well-developed and well-nourished.  Neck: Neck supple.  Cardiovascular: Normal rate, regular rhythm and normal heart sounds.   Pulmonary/Chest: Effort normal and breath sounds normal.  Genitourinary:     Resolving abscess on left mons.  Right inguinal sebacous cyst 8 mm  Lymphadenopathy:    She has no cervical adenopathy.  Neurological: She is alert and oriented to person, place, and time.    Data Reviewed Finger stick blood sugar was normal at 98.  Assessment    Resolving continuous abscess. No indication for additional therapy.    Plan    The patient will call she has any new issues. We'll confirm that she has had colonoscopy screening. Follow up otherwise will be on an as-needed basis.  PCP:  Vanetta Shawl 05/23/2014, 7:42 AM

## 2014-05-23 ENCOUNTER — Telehealth: Payer: Self-pay | Admitting: *Deleted

## 2014-05-23 ENCOUNTER — Encounter: Payer: Self-pay | Admitting: *Deleted

## 2014-05-23 DIAGNOSIS — L02214 Cutaneous abscess of groin: Secondary | ICD-10-CM | POA: Insufficient documentation

## 2014-05-23 DIAGNOSIS — L02219 Cutaneous abscess of trunk, unspecified: Secondary | ICD-10-CM | POA: Insufficient documentation

## 2014-05-23 NOTE — Telephone Encounter (Signed)
Pt called back and her upper and lower was done in 2014 not 2013

## 2014-05-23 NOTE — Telephone Encounter (Signed)
Colonoscopy & Endoscopy was 2013 with [facility] in [place], she did have colon polyps.

## 2014-05-23 NOTE — Telephone Encounter (Signed)
-----   Message from Robert Bellow, MD sent at 05/23/2014  7:44 AM EST ----- Please contact the patient see if she's ever had a colonoscopy. Thank you

## 2014-06-19 ENCOUNTER — Ambulatory Visit: Payer: Self-pay | Admitting: Physician Assistant

## 2014-08-11 NOTE — Op Note (Signed)
PATIENT NAME:  [name], Shannon Li MR#:  [id number] DATE OF BIRTH:  12-01-1950  DATE OF PROCEDURE:  11/17/2013  PREOPERATIVE DIAGNOSIS: Right gluteal abscess.   POSTOPERATIVE DIAGNOSIS: Right gluteal abscess.   OPERATIVE PROCEDURE: Exam under anesthesia, incision and drainage of right gluteal abscess.   SURGEON: Hervey Ard, MD   ANESTHESIA: General endotracheal under Dr. Kayleen Memos, Marcaine 0.5% with 1:200,000 units epinephrine, 10 mL local infiltration.   ESTIMATED BLOOD LOSS: Less than 25 mL.   CLINICAL NOTE: This 64 year old woman presented with a 2-1/2 to 3 day history of right gluteal pain, fever and nausea. Examination showed a 7 to 8 cm area of swelling in the deep right gluteal tissue. She was admitted for operative drainage.   OPERATIVE NOTE: The patient received Invanz prior to the procedure. With the patient under adequate general endotracheal anesthesia, she was placed in the dorsal lithotomy position and the perineum prepped with Betadine solution and draped. Marcaine was infiltrated for postoperative analgesia. A radial incision was made at the 8 o'clock position, approximately 5 cm in length, and carried down through the adipose tissue. Approximately 3 cm deep a cavity was entered with a small amount of liquefied odorous material of which a sample was sent for culture and sensitivity. The area was examined by the index finger and showed there was a pocket extending anterior and superficially near the labia and then a deeper pocket extending down towards the gluteal musculature. The loculations were broken up. A Penrose drain was placed in the superior segment and anchored in place with 2-0 nylon and a similar drain placed posteriorly to the gluteal area. Both were brought out through the wound with transdermal sutures to anchor in place. These were then anchored to the skin with a safety pin.   Digital rectal exam was unremarkable, as was digital vaginal exam.   The patient  tolerated the procedure well. A peripad was placed followed by elastic underwear. She was taken to the recovery room in stable condition.   ____________________________ Robert Bellow, MD jwb:sb D: 11/17/2013 16:23:34 ET T: 11/17/2013 16:36:04 ET JOB#: [id number]  cc: Robert Bellow, MD, <Dictator> Lavera Guise, MD Brennyn Ortlieb Amedeo Kinsman MD ELECTRONICALLY SIGNED 11/21/2013 7:47

## 2014-08-11 NOTE — Discharge Summary (Signed)
PATIENT NAME:  Shannon Li, Shannon Li MR#:  [id number] DATE OF BIRTH:  1950/04/28  DATE OF ADMISSION:  11/17/2013 DATE OF DISCHARGE:  11/20/2013  DISCHARGE DIAGNOSIS:  Perirectal abscess.  CLINICAL NOTE: The patient presented with a 2-day history of perirectal pain, fever, chills and nausea. Examination showed evidence of a perirectal abscess. The patient was taken to the operating room on the day of admission, at which time, she underwent incision and drainage of perirectal abscess. There was evidence of extension into the right labial area and into the right ischial rectal fossa. Wide drainage was completed, and drains placed. The patient showed evidence of systemic inflammatory response on admission, based on her white blood cell count and the vital signs.   The patient has had rapid resolution of her pain and erythema by the morning after surgery and gradual improvement in her general sense of well-being over the next several days.   SUMMARY OF LABORATORY STUDIES: Initial white blood cell count of 17,200 with a hemoglobin of 14.2 and 90% polys. A white blood cell had fallen to 12,100 by the morning of postoperative day 2.   The patient remained afebrile during her hospitalization. Hypotension notable within the first 24 hours of surgery, responded to fluid administration.   IMPRESSION/DISCHARGE DIAGNOSES:  1.  Right perirectal abscess.  2.  Systemic inflammatory response syndrome.  3.  History of essential hypertension.  4.  Gastroesophageal reflux.  5.  Irritable bowel syndrome.  6.  Depression.   At the time of discharge, all of her regular medications were resumed with the exception of Norvasc which was held. Culture results which became available and after discharge, showed rare Candida species, light growth of Parabacteroides distasonis and a growth of an anaerobic gram-negative rod, which was not characterized further, except for being beta-lactamase positive.     ____________________________ Robert Bellow, MD jwb:ds D: 11/25/2013 14:56:57 ET T: 11/25/2013 15:10:23 ET JOB#: [id number]  cc: Robert Bellow, MD, <Dictator> Lavera Guise, MD Drayton Tieu Amedeo Kinsman MD ELECTRONICALLY SIGNED 11/27/2013 10:04

## 2014-11-15 ENCOUNTER — Ambulatory Visit: Payer: PPO | Admitting: Anesthesiology

## 2014-11-15 ENCOUNTER — Ambulatory Visit
Admission: RE | Admit: 2014-11-15 | Discharge: 2014-11-15 | Disposition: A | Discharge status: Home / Self Care | Payer: PPO | Source: Ambulatory Visit | Attending: Gastroenterology | Admitting: Gastroenterology

## 2014-11-15 ENCOUNTER — Encounter
Admission: RE | Disposition: A | Discharge status: Home / Self Care | Payer: Self-pay | Source: Ambulatory Visit | Attending: Gastroenterology

## 2014-11-15 DIAGNOSIS — R1013 Epigastric pain: Secondary | ICD-10-CM | POA: Diagnosis present

## 2014-11-15 DIAGNOSIS — J449 Chronic obstructive pulmonary disease, unspecified: Secondary | ICD-10-CM | POA: Insufficient documentation

## 2014-11-15 DIAGNOSIS — I10 Essential (primary) hypertension: Secondary | ICD-10-CM | POA: Insufficient documentation

## 2014-11-15 DIAGNOSIS — Z79899 Other long term (current) drug therapy: Secondary | ICD-10-CM | POA: Insufficient documentation

## 2014-11-15 DIAGNOSIS — I739 Peripheral vascular disease, unspecified: Secondary | ICD-10-CM | POA: Insufficient documentation

## 2014-11-15 DIAGNOSIS — K222 Esophageal obstruction: Secondary | ICD-10-CM | POA: Insufficient documentation

## 2014-11-15 DIAGNOSIS — K210 Gastro-esophageal reflux disease with esophagitis: Secondary | ICD-10-CM | POA: Insufficient documentation

## 2014-11-15 DIAGNOSIS — K449 Diaphragmatic hernia without obstruction or gangrene: Secondary | ICD-10-CM | POA: Diagnosis not present

## 2014-11-15 DIAGNOSIS — F17210 Nicotine dependence, cigarettes, uncomplicated: Secondary | ICD-10-CM | POA: Insufficient documentation

## 2014-11-15 HISTORY — PX: ESOPHAGOGASTRODUODENOSCOPY: SHX5428

## 2014-11-15 SURGERY — ESOPHAGOGASTRODUODENOSCOPY (EGD)
Anesthesia: General

## 2014-11-15 MED ORDER — PROPOFOL 10 MG/ML IV BOLUS
INTRAVENOUS | Status: DC | PRN
  Administered 2014-11-15 (×3): 20 mg via INTRAVENOUS
  Administered 2014-11-15: 30 mg via INTRAVENOUS

## 2014-11-15 MED ORDER — SODIUM CHLORIDE 0.9 % IV SOLN
INTRAVENOUS | Status: DC
  Administered 2014-11-15 (×2): via INTRAVENOUS

## 2014-11-15 MED ORDER — MIDAZOLAM HCL 2 MG/2ML IJ SOLN
INTRAMUSCULAR | Status: DC | PRN
  Administered 2014-11-15: 1 mg via INTRAVENOUS

## 2014-11-15 NOTE — H&P (Signed)
  Date of Initial H&P:10/29/2014  History reviewed, patient examined, no change in status, stable for surgery.

## 2014-11-15 NOTE — Anesthesia Preprocedure Evaluation (Signed)
Anesthesia Evaluation  Patient identified by MRN, date of birth, ID band Patient awake    Reviewed: Allergy & Precautions, H&P , NPO status , Patient's Chart, lab work & pertinent test results, reviewed documented beta blocker date and time   Airway Mallampati: II  TM Distance: >3 FB Neck ROM: full    Dental no notable dental hx. (+) Teeth Intact   Pulmonary neg pulmonary ROS, Current Smoker,  breath sounds clear to auscultation  Pulmonary exam normal       Cardiovascular Exercise Tolerance: Good hypertension, + Peripheral Vascular Disease negative cardio ROS  Rhythm:regular Rate:Normal     Neuro/Psych negative neurological ROS  negative psych ROS   GI/Hepatic negative GI ROS, Neg liver ROS,   Endo/Other  negative endocrine ROS  Renal/GU Renal hypertensionRenal diseasenegative Renal ROS  negative genitourinary   Musculoskeletal   Abdominal   Peds  Hematology negative hematology ROS (+)   Anesthesia Other Findings   Reproductive/Obstetrics negative OB ROS                             Anesthesia Physical Anesthesia Plan  ASA: III  Anesthesia Plan: General   Post-op Pain Management:    Induction:   Airway Management Planned:   Additional Equipment:   Intra-op Plan:   Post-operative Plan:   Informed Consent: I have reviewed the patients History and Physical, chart, labs and discussed the procedure including the risks, benefits and alternatives for the proposed anesthesia with the patient or authorized representative who has indicated his/her understanding and acceptance.   Dental Advisory Given  Plan Discussed with: CRNA  Anesthesia Plan Comments:         Anesthesia Quick Evaluation

## 2014-11-15 NOTE — Op Note (Signed)
[facility]astroenter[facility] Patient Name: Shannon Li Procedure Date: 11/15/2014 11:17 AM MRN: [id number] Account #: [id number] Date of Birth: Nov 23, 1950 Admit Type: Outpatient Age: 64 Room: [facility] ENDO ROOM 4 Gender: Female Note Status: Finalized Procedure:         Upper GI endoscopy Indications:       Epigastric abdominal pain, Suspected esophageal reflux Providers:         Lupita Dawn. Candace Cruise, MD Referring MD:      Lavera Guise, MD (Referring MD) Medicines:         Monitored Anesthesia Care Complications:     No immediate complications. Procedure:         Pre-Anesthesia Assessment:                    - Prior to the procedure, a History and Physical was                     performed, and patient medications, allergies and                     sensitivities were reviewed. The patient's tolerance of                     previous anesthesia was reviewed.                    - The risks and benefits of the procedure and the sedation                     options and risks were discussed with the patient. All                     questions were answered and informed consent was obtained.                    - After reviewing the risks and benefits, the patient was                     deemed in satisfactory condition to undergo the procedure.                    After obtaining informed consent, the endoscope was passed                     under direct vision. Throughout the procedure, the                     patient's blood pressure, pulse, and oxygen saturations                     were monitored continuously. The Endoscope was introduced                     through the mouth, and advanced to the second part of                     duodenum. The upper GI endoscopy was accomplished without                     difficulty. The patient tolerated the procedure well. Findings:      A benign-appearing, intrinsic mild stenosis was found at the       gastroesophageal junction. Biopsies  were taken with a cold forceps for  histology. The scope was withdrawn. Dilation was performed with a       Maloney dilator with mild resistance at 72 Fr.      The exam was otherwise without abnormality.      The examined duodenum was normal.      A small hiatus hernia was present.      The exam was otherwise without abnormality. Impression:        - Benign-appearing esophageal stricture. Biopsied. Dilated.                    - The examination was otherwise normal.                    - Normal examined duodenum.                    - Small hiatus hernia.                    - The examination was otherwise normal. Recommendation:    - Discharge patient to home.                    - Observe patient's clinical course.                    - Await pathology results.                    - Continue present medications.                    - The findings and recommendations were discussed with the                     patient. Procedure Code(s): --- Professional ---                    [phone], Esophagogastroduodenoscopy, flexible, transoral;                     with biopsy, single or multiple                    43450, Dilation of esophagus, by unguided sound or bougie,                     single or multiple passes Diagnosis Code(s): --- Professional ---                    K22.2, Esophageal obstruction                    K44.9, Diaphragmatic hernia without obstruction or gangrene                    R10.13, Epigastric pain CPT copyright 2014 [employer] Medical Association. All rights reserved. The codes documented in this report are preliminary and upon coder review may  be revised to meet current compliance requirements. Hulen Luster, MD 11/15/2014 11:35:06 AM This report has been signed electronically. Number of Addenda: 0 Note Initiated On: 11/15/2014 11:17 AM      [facility]

## 2014-11-15 NOTE — Anesthesia Postprocedure Evaluation (Signed)
  Anesthesia Post-op Note  Patient: Shannon Li  Procedure(s) Performed: Procedure(s): ESOPHAGOGASTRODUODENOSCOPY (EGD) (N/A)  Anesthesia type:General  Patient location: PACU  Post pain: Pain level controlled  Post assessment: Post-op Vital signs reviewed, Patient's Cardiovascular Status Stable, Respiratory Function Stable, Patent Airway and No signs of Nausea or vomiting  Post vital signs: Reviewed and stable  Last Vitals:  Filed Vitals:   11/15/14 1142  BP: 98/52  Pulse: 64  Temp:   Resp: 21    Level of consciousness: awake, alert  and patient cooperative  Complications: No apparent anesthesia complications

## 2014-11-15 NOTE — Transfer of Care (Signed)
Immediate Anesthesia Transfer of Care Note  Patient: Shannon Li  Procedure(s) Performed: Procedure(s): ESOPHAGOGASTRODUODENOSCOPY (EGD) (N/A)  Patient Location: PACU and Endoscopy Unit  Anesthesia Type:General  Level of Consciousness: awake  Airway & Oxygen Therapy: Patient Spontanous Breathing  Post-op Assessment: Report given to RN  Post vital signs: stable  Last Vitals:  Filed Vitals:   11/15/14 1142  BP: 98/52  Pulse: 64  Temp:   Resp: 21    Complications: No apparent anesthesia complications

## 2014-11-16 ENCOUNTER — Encounter: Payer: Self-pay | Admitting: Gastroenterology

## 2014-11-16 LAB — SURGICAL PATHOLOGY

## 2015-03-11 ENCOUNTER — Other Ambulatory Visit: Payer: Self-pay | Admitting: Physician Assistant

## 2015-03-11 DIAGNOSIS — M810 Age-related osteoporosis without current pathological fracture: Secondary | ICD-10-CM

## 2015-03-11 DIAGNOSIS — Z1231 Encounter for screening mammogram for malignant neoplasm of breast: Secondary | ICD-10-CM

## 2015-03-21 ENCOUNTER — Ambulatory Visit
Admission: RE | Admit: 2015-03-21 | Discharge: 2015-03-21 | Disposition: A | Discharge status: Home / Self Care | Payer: PPO | Source: Ambulatory Visit | Attending: Physician Assistant | Admitting: Physician Assistant

## 2015-03-21 DIAGNOSIS — Z1231 Encounter for screening mammogram for malignant neoplasm of breast: Secondary | ICD-10-CM | POA: Insufficient documentation

## 2015-03-21 DIAGNOSIS — Z13820 Encounter for screening for osteoporosis: Secondary | ICD-10-CM | POA: Diagnosis present

## 2015-03-21 DIAGNOSIS — F17200 Nicotine dependence, unspecified, uncomplicated: Secondary | ICD-10-CM | POA: Diagnosis not present

## 2015-03-21 DIAGNOSIS — M8580 Other specified disorders of bone density and structure, unspecified site: Secondary | ICD-10-CM | POA: Diagnosis not present

## 2015-03-21 DIAGNOSIS — M810 Age-related osteoporosis without current pathological fracture: Secondary | ICD-10-CM

## 2015-03-21 DIAGNOSIS — Z780 Asymptomatic menopausal state: Secondary | ICD-10-CM | POA: Insufficient documentation

## 2015-05-21 ENCOUNTER — Ambulatory Visit
Admission: RE | Admit: 2015-05-21 | Discharge: 2015-05-21 | Disposition: A | Discharge status: Home / Self Care | Payer: PPO | Source: Ambulatory Visit | Attending: Physician Assistant | Admitting: Physician Assistant

## 2015-05-21 ENCOUNTER — Other Ambulatory Visit: Payer: Self-pay | Admitting: Physician Assistant

## 2015-05-21 DIAGNOSIS — M25551 Pain in right hip: Secondary | ICD-10-CM | POA: Diagnosis present

## 2015-05-21 DIAGNOSIS — M545 Low back pain: Secondary | ICD-10-CM | POA: Diagnosis not present

## 2015-07-01 ENCOUNTER — Other Ambulatory Visit: Payer: Self-pay | Admitting: Nurse Practitioner

## 2015-07-01 DIAGNOSIS — R970 Elevated carcinoembryonic antigen [CEA]: Secondary | ICD-10-CM | POA: Diagnosis not present

## 2015-07-01 DIAGNOSIS — F17210 Nicotine dependence, cigarettes, uncomplicated: Secondary | ICD-10-CM

## 2015-07-01 DIAGNOSIS — N83202 Unspecified ovarian cyst, left side: Secondary | ICD-10-CM

## 2015-07-01 DIAGNOSIS — R1084 Generalized abdominal pain: Secondary | ICD-10-CM | POA: Diagnosis not present

## 2015-07-01 DIAGNOSIS — K219 Gastro-esophageal reflux disease without esophagitis: Secondary | ICD-10-CM | POA: Diagnosis not present

## 2015-07-01 DIAGNOSIS — R0602 Shortness of breath: Secondary | ICD-10-CM | POA: Diagnosis not present

## 2015-07-05 ENCOUNTER — Ambulatory Visit
Admission: RE | Admit: 2015-07-05 | Discharge: 2015-07-05 | Disposition: A | Discharge status: Home / Self Care | Payer: PPO | Source: Ambulatory Visit | Attending: Nurse Practitioner | Admitting: Nurse Practitioner

## 2015-07-05 DIAGNOSIS — R0602 Shortness of breath: Secondary | ICD-10-CM | POA: Diagnosis not present

## 2015-07-05 DIAGNOSIS — N261 Atrophy of kidney (terminal): Secondary | ICD-10-CM | POA: Insufficient documentation

## 2015-07-05 DIAGNOSIS — N83201 Unspecified ovarian cyst, right side: Secondary | ICD-10-CM | POA: Insufficient documentation

## 2015-07-05 DIAGNOSIS — F17210 Nicotine dependence, cigarettes, uncomplicated: Secondary | ICD-10-CM | POA: Insufficient documentation

## 2015-07-05 DIAGNOSIS — R1084 Generalized abdominal pain: Secondary | ICD-10-CM | POA: Insufficient documentation

## 2015-07-05 DIAGNOSIS — K760 Fatty (change of) liver, not elsewhere classified: Secondary | ICD-10-CM | POA: Diagnosis not present

## 2015-07-05 DIAGNOSIS — R918 Other nonspecific abnormal finding of lung field: Secondary | ICD-10-CM | POA: Diagnosis not present

## 2015-07-05 DIAGNOSIS — K5730 Diverticulosis of large intestine without perforation or abscess without bleeding: Secondary | ICD-10-CM | POA: Diagnosis not present

## 2015-07-05 DIAGNOSIS — R970 Elevated carcinoembryonic antigen [CEA]: Secondary | ICD-10-CM | POA: Insufficient documentation

## 2015-07-05 DIAGNOSIS — I2510 Atherosclerotic heart disease of native coronary artery without angina pectoris: Secondary | ICD-10-CM | POA: Diagnosis not present

## 2015-07-05 DIAGNOSIS — N83202 Unspecified ovarian cyst, left side: Secondary | ICD-10-CM | POA: Insufficient documentation

## 2015-07-05 HISTORY — DX: Disorder of kidney and ureter, unspecified: N28.9

## 2015-07-05 MED ORDER — IOHEXOL 300 MG/ML  SOLN
85.0000 mL | Freq: Once | INTRAMUSCULAR | Status: AC | PRN
  Administered 2015-07-05: 85 mL via INTRAVENOUS

## 2015-07-10 DIAGNOSIS — K449 Diaphragmatic hernia without obstruction or gangrene: Secondary | ICD-10-CM | POA: Diagnosis not present

## 2015-07-10 DIAGNOSIS — K31819 Angiodysplasia of stomach and duodenum without bleeding: Secondary | ICD-10-CM | POA: Diagnosis not present

## 2015-07-15 NOTE — Op Note (Addendum)
[facility] Gastroenterology Patient Name: Shannon Li Procedure Date: 07/10/2015 11:54 AM MRN: [id number] Account #: [id number] Date of Birth: 03-26-1951 Admit Type: Inpatient Age: 65 Room: [facility] ENDO ROOM 3 Gender: Female Note Status: Finalized Procedure:            Upper GI endoscopy Indications:          Melena Providers:            Manya Silvas, MD Referring MD:         Marciano Sequin. Deborra Medina (Referring MD) Medicines:            Propofol per Anesthesia Complications:        No immediate complications. Procedure:            Pre-Anesthesia Assessment:                       - After reviewing the risks and benefits, the patient                        was deemed in satisfactory condition to undergo the                        procedure.                       After obtaining informed consent, the endoscope was                        passed under direct vision. Throughout the procedure,                        the patient's blood pressure, pulse, and oxygen                        saturations were monitored continuously. The                        Colonoscope was introduced through the mouth, and                        advanced to the second part of duodenum. The upper GI                        endoscopy was accomplished without difficulty. The                        patient tolerated the procedure well. Findings:      A small hiatal hernia was present.      The stomach was normal.      A single small-medium angiodysplastic lesion without bleeding was found       in the duodenal bulb. Coagulation for tissue destruction using argon       plasma at 0.5 liters/minute and 20 watts was partly successful. Due to       oozing after treatment To prevent bleeding post-maneuver, three       hemostatic clips were successfully placed. There was no bleeding at the       end of the procedure. Impression:           - Small hiatal hernia.                       -  Normal stomach.                     - A single non-bleeding angiodysplastic lesion in the                        duodenum. Treated with argon plasma coagulation (APC).                        Clips were placed.                       - No specimens collected. Recommendation:       - clear liquid diet. Advance to full liquid tomorrow. Manya Silvas, MD 07/10/2015 4:12:21 PM This report has been signed electronically. Number of Addenda: 0 Note Initiated On: 07/10/2015 11:54 AM      [facility]

## 2015-07-22 DIAGNOSIS — R300 Dysuria: Secondary | ICD-10-CM | POA: Diagnosis not present

## 2015-07-22 DIAGNOSIS — K219 Gastro-esophageal reflux disease without esophagitis: Secondary | ICD-10-CM | POA: Diagnosis not present

## 2015-07-22 DIAGNOSIS — I2510 Atherosclerotic heart disease of native coronary artery without angina pectoris: Secondary | ICD-10-CM | POA: Diagnosis not present

## 2015-07-24 DIAGNOSIS — R0602 Shortness of breath: Secondary | ICD-10-CM | POA: Diagnosis not present

## 2015-07-24 DIAGNOSIS — R079 Chest pain, unspecified: Secondary | ICD-10-CM | POA: Diagnosis not present

## 2015-08-01 DIAGNOSIS — J301 Allergic rhinitis due to pollen: Secondary | ICD-10-CM | POA: Diagnosis not present

## 2015-08-01 DIAGNOSIS — J0111 Acute recurrent frontal sinusitis: Secondary | ICD-10-CM | POA: Diagnosis not present

## 2015-08-07 ENCOUNTER — Other Ambulatory Visit: Payer: Self-pay | Admitting: Physician Assistant

## 2015-08-07 DIAGNOSIS — R079 Chest pain, unspecified: Secondary | ICD-10-CM

## 2015-08-14 ENCOUNTER — Ambulatory Visit
Admission: RE | Admit: 2015-08-14 | Discharge: 2015-08-14 | Disposition: A | Discharge status: Home / Self Care | Payer: PPO | Source: Ambulatory Visit | Attending: Physician Assistant | Admitting: Physician Assistant

## 2015-08-14 DIAGNOSIS — Z880 Allergy status to penicillin: Secondary | ICD-10-CM | POA: Diagnosis not present

## 2015-08-14 DIAGNOSIS — I200 Unstable angina: Secondary | ICD-10-CM | POA: Diagnosis not present

## 2015-08-14 DIAGNOSIS — R079 Chest pain, unspecified: Secondary | ICD-10-CM | POA: Diagnosis present

## 2015-08-14 LAB — NM MYOCAR MULTI W/SPECT W/WALL MOTION / EF
Estimated workload: 1 METS
Exercise duration (min): 1 min
Exercise duration (sec): 3 s
LV dias vol: 92 mL (ref 46–106)
LV sys vol: 24 mL
MPHR: 154 {beats}/min
Peak HR: 96 {beats}/min
Percent HR: 62 %
Rest HR: 50 {beats}/min
SDS: 0
SRS: 1
SSS: 1
TID: 1.23

## 2015-08-14 MED ORDER — TECHNETIUM TC 99M SESTAMIBI - CARDIOLITE
13.0000 | Freq: Once | INTRAVENOUS | Status: AC | PRN
  Administered 2015-08-14: 13.475 via INTRAVENOUS

## 2015-08-14 MED ORDER — TECHNETIUM TC 99M SESTAMIBI - CARDIOLITE
29.9900 | Freq: Once | INTRAVENOUS | Status: AC | PRN
  Administered 2015-08-14: 29.99 via INTRAVENOUS

## 2015-08-14 MED ORDER — REGADENOSON 0.4 MG/5ML IV SOLN
0.4000 mg | Freq: Once | INTRAVENOUS | Status: AC
  Administered 2015-08-14: 0.4 mg via INTRAVENOUS

## 2015-08-22 DIAGNOSIS — N390 Urinary tract infection, site not specified: Secondary | ICD-10-CM | POA: Diagnosis not present

## 2015-08-23 ENCOUNTER — Encounter: Payer: Self-pay | Admitting: *Deleted

## 2015-08-26 ENCOUNTER — Ambulatory Visit: Payer: PPO | Admitting: Anesthesiology

## 2015-08-26 ENCOUNTER — Ambulatory Visit
Admission: RE | Admit: 2015-08-26 | Discharge: 2015-08-26 | Disposition: A | Discharge status: Home Health | Payer: PPO | Source: Ambulatory Visit | Attending: Gastroenterology | Admitting: Gastroenterology

## 2015-08-26 ENCOUNTER — Encounter: Payer: Self-pay | Admitting: *Deleted

## 2015-08-26 ENCOUNTER — Encounter
Admission: RE | Disposition: A | Discharge status: Home Health | Payer: Self-pay | Source: Ambulatory Visit | Attending: Gastroenterology

## 2015-08-26 DIAGNOSIS — K5730 Diverticulosis of large intestine without perforation or abscess without bleeding: Secondary | ICD-10-CM | POA: Insufficient documentation

## 2015-08-26 DIAGNOSIS — F329 Major depressive disorder, single episode, unspecified: Secondary | ICD-10-CM | POA: Insufficient documentation

## 2015-08-26 DIAGNOSIS — Z79899 Other long term (current) drug therapy: Secondary | ICD-10-CM | POA: Diagnosis not present

## 2015-08-26 DIAGNOSIS — Z888 Allergy status to other drugs, medicaments and biological substances status: Secondary | ICD-10-CM | POA: Diagnosis not present

## 2015-08-26 DIAGNOSIS — K219 Gastro-esophageal reflux disease without esophagitis: Secondary | ICD-10-CM | POA: Insufficient documentation

## 2015-08-26 DIAGNOSIS — F17210 Nicotine dependence, cigarettes, uncomplicated: Secondary | ICD-10-CM | POA: Insufficient documentation

## 2015-08-26 DIAGNOSIS — Z880 Allergy status to penicillin: Secondary | ICD-10-CM | POA: Insufficient documentation

## 2015-08-26 DIAGNOSIS — F17200 Nicotine dependence, unspecified, uncomplicated: Secondary | ICD-10-CM | POA: Diagnosis not present

## 2015-08-26 DIAGNOSIS — K589 Irritable bowel syndrome without diarrhea: Secondary | ICD-10-CM | POA: Diagnosis not present

## 2015-08-26 DIAGNOSIS — I739 Peripheral vascular disease, unspecified: Secondary | ICD-10-CM | POA: Insufficient documentation

## 2015-08-26 DIAGNOSIS — R970 Elevated carcinoembryonic antigen [CEA]: Secondary | ICD-10-CM | POA: Insufficient documentation

## 2015-08-26 DIAGNOSIS — J449 Chronic obstructive pulmonary disease, unspecified: Secondary | ICD-10-CM | POA: Insufficient documentation

## 2015-08-26 DIAGNOSIS — Z86010 Personal history of colonic polyps: Secondary | ICD-10-CM | POA: Diagnosis not present

## 2015-08-26 DIAGNOSIS — Z9049 Acquired absence of other specified parts of digestive tract: Secondary | ICD-10-CM | POA: Insufficient documentation

## 2015-08-26 DIAGNOSIS — I10 Essential (primary) hypertension: Secondary | ICD-10-CM | POA: Diagnosis not present

## 2015-08-26 DIAGNOSIS — F419 Anxiety disorder, unspecified: Secondary | ICD-10-CM | POA: Insufficient documentation

## 2015-08-26 DIAGNOSIS — K5790 Diverticulosis of intestine, part unspecified, without perforation or abscess without bleeding: Secondary | ICD-10-CM | POA: Diagnosis not present

## 2015-08-26 HISTORY — DX: Esophagitis, unspecified without bleeding: K20.90

## 2015-08-26 HISTORY — DX: Diverticulosis of intestine, part unspecified, without perforation or abscess without bleeding: K57.90

## 2015-08-26 HISTORY — DX: Other constipation: K59.09

## 2015-08-26 HISTORY — DX: Personal history of other diseases of the digestive system: Z87.19

## 2015-08-26 HISTORY — DX: Diffuse cystic mastopathy of unspecified breast: N60.19

## 2015-08-26 HISTORY — DX: Gastro-esophageal reflux disease without esophagitis: K21.9

## 2015-08-26 HISTORY — DX: Depression, unspecified: F32.A

## 2015-08-26 HISTORY — DX: Esophagitis, unspecified: K20.9

## 2015-08-26 HISTORY — DX: Unspecified osteoarthritis, unspecified site: M19.90

## 2015-08-26 HISTORY — DX: Esophageal obstruction: K22.2

## 2015-08-26 HISTORY — DX: Atrophy of kidney (terminal): N26.1

## 2015-08-26 HISTORY — DX: Asymptomatic menopausal state: Z78.0

## 2015-08-26 HISTORY — DX: Chronic obstructive pulmonary disease, unspecified: J44.9

## 2015-08-26 HISTORY — PX: COLONOSCOPY WITH PROPOFOL: SHX5780

## 2015-08-26 HISTORY — DX: Major depressive disorder, single episode, unspecified: F32.9

## 2015-08-26 HISTORY — DX: Anxiety disorder, unspecified: F41.9

## 2015-08-26 SURGERY — COLONOSCOPY WITH PROPOFOL
Anesthesia: General

## 2015-08-26 MED ORDER — SODIUM CHLORIDE 0.9 % IV SOLN
INTRAVENOUS | Status: DC
  Administered 2015-08-26: 13:00:00 via INTRAVENOUS

## 2015-08-26 MED ORDER — PROPOFOL 500 MG/50ML IV EMUL
INTRAVENOUS | Status: DC | PRN
  Administered 2015-08-26: 160 ug/kg/min via INTRAVENOUS

## 2015-08-26 MED ORDER — PROPOFOL 10 MG/ML IV BOLUS
INTRAVENOUS | Status: DC | PRN
  Administered 2015-08-26: 60 mg via INTRAVENOUS
  Administered 2015-08-26: 40 mg via INTRAVENOUS

## 2015-08-26 MED ORDER — LIDOCAINE HCL (CARDIAC) 20 MG/ML IV SOLN
INTRAVENOUS | Status: DC | PRN
  Administered 2015-08-26: 60 mg via INTRAVENOUS

## 2015-08-26 MED ORDER — SODIUM CHLORIDE 0.9 % IV SOLN: INTRAVENOUS | Status: DC

## 2015-08-26 NOTE — Transfer of Care (Signed)
Immediate Anesthesia Transfer of Care Note  Patient: Shannon Li  Procedure(s) Performed: Procedure(s): COLONOSCOPY WITH PROPOFOL (N/A)  Patient Location: Endoscopy Unit  Anesthesia Type:General  Level of Consciousness: awake  Airway & Oxygen Therapy: Patient connected to nasal cannula oxygen  Post-op Assessment: Post -op Vital signs reviewed and stable  Post vital signs: stable  Last Vitals:  Filed Vitals:   08/26/15 1254 08/26/15 1408  BP: 140/65 106/63  Pulse: 56 62  Temp: 36.6 C 36.1 C  Resp: 16 17    Last Pain: There were no vitals filed for this visit.       Complications: No apparent anesthesia complications

## 2015-08-26 NOTE — Op Note (Signed)
[facility] Gastroenterology Patient Name: Shannon Li Procedure Date: 08/26/2015 1:42 PM MRN: [id number] Account #: [id number] Date of Birth: 07-30-1950 Admit Type: Outpatient Age: 65 Room: [facility] ENDO ROOM 4 Gender: Female Note Status: Finalized Procedure:            Colonoscopy Indications:          Elevated CEA, normal CT Providers:            Lupita Dawn. Candace Cruise, MD Referring MD:         Lavera Guise, MD (Referring MD), Leighton Ruff. Radford Pax, RN                        (Referring MD) Medicines:            Monitored Anesthesia Care Complications:        No immediate complications. Procedure:            Pre-Anesthesia Assessment:                       - Prior to the procedure, a History and Physical was                        performed, and patient medications, allergies and                        sensitivities were reviewed. The patient's tolerance of                        previous anesthesia was reviewed.                       - The risks and benefits of the procedure and the                        sedation options and risks were discussed with the                        patient. All questions were answered and informed                        consent was obtained.                       - After reviewing the risks and benefits, the patient                        was deemed in satisfactory condition to undergo the                        procedure.                       After obtaining informed consent, the colonoscope was                        passed under direct vision. Throughout the procedure,                        the patient's blood pressure, pulse, and oxygen  saturations were monitored continuously. The                        Colonoscope was introduced through the anus and                        advanced to the the cecum, identified by appendiceal                        orifice and ileocecal valve. The colonoscopy was   performed without difficulty. The patient tolerated the                        procedure well. The quality of the bowel preparation                        was good. Findings:      A few diverticula were found in the sigmoid colon.      The exam was otherwise without abnormality. Impression:           - Diverticulosis in the sigmoid colon.                       - The examination was otherwise normal.                       - No specimens collected. Recommendation:       - Discharge patient to home.                       - The findings and recommendations were discussed with                        the patient. Procedure Code(s):    --- Professional ---                       [id number], Colonoscopy, flexible; diagnostic, including                        collection of specimen(s) by brushing or washing, when                        performed (separate procedure) Diagnosis Code(s):    --- Professional ---                       K57.30, Diverticulosis of large intestine without                        perforation or abscess without bleeding CPT copyright 2016 American Medical Association. All rights reserved. The codes documented in this report are preliminary and upon coder review may  be revised to meet current compliance requirements. Hulen Luster, MD 08/26/2015 2:04:09 PM This report has been signed electronically. Number of Addenda: 0 Note Initiated On: 08/26/2015 1:42 PM Scope Withdrawal Time: 0 hours 4 minutes 50 seconds  Total Procedure Duration: 0 hours 8 minutes 17 seconds       [facility]

## 2015-08-26 NOTE — H&P (Signed)
Primary Care Physician:  Lavera Guise, MD Primary Gastroenterologist:  Dr. Candace Cruise  Pre-Procedure History & Physical: HPI:  Shannon Li is a 65 y.o. female is here for an Colonoscopy   Past Medical History  Diagnosis Date  . Ovarian cyst   . Menopause   . Plantar fasciitis   . Renal artery occlusion ([place])   . Hypertension   . IBS (irritable bowel syndrome)   . Colon polyp 2013  . Renal insufficiency     Right kidney is small and not functioning.  . Anxiety   . Arthritis   . Atrophic kidney   . Esophageal stricture   . Chronic constipation   . Depression   . COPD (chronic obstructive pulmonary disease) ([place])   . Diverticulosis   . Esophagitis   . History of hiatal hernia   . Fibrocystic breast disease   . GERD (gastroesophageal reflux disease)   . Postmenopausal     Past Surgical History  Procedure Laterality Date  . Cholecystectomy  2008  . Abscess drainage  11/17/13    buttock right  . Colonoscopy  2014    [facility] [place]  . Upper gi endoscopy  2014    [facility] in [place]  . Esophagogastroduodenoscopy N/A 11/15/2014    Procedure: ESOPHAGOGASTRODUODENOSCOPY (EGD);  Surgeon: Hulen Luster, MD;  Location: [facility] ENDOSCOPY;  Service: Gastroenterology;  Laterality: N/A;  . Breast biopsy Right     neg    Prior to Admission medications   Medication Sig Start Date End Date Taking? Authorizing Provider  amLODipine (NORVASC) 2.5 MG tablet Take 2.5 mg by mouth daily.   Yes Historical Provider, MD  bisoprolol-hydrochlorothiazide (ZIAC) 2.5-6.25 MG per tablet Take 1 tablet by mouth daily.   Yes Historical Provider, MD  dicyclomine (BENTYL) 10 MG capsule Take 10 mg by mouth 4 (four) times daily -  before meals and at bedtime.   Yes Historical Provider, MD  escitalopram (LEXAPRO) 10 MG tablet Take 15 mg by mouth daily.   Yes Historical Provider, MD  Eszopiclone (ESZOPICLONE) 3 MG TABS Take 3 mg by mouth at bedtime. Take immediately before bedtime   Yes  Historical Provider, MD  pantoprazole (PROTONIX) 40 MG tablet Take 40 mg by mouth daily.   Yes Historical Provider, MD  pravastatin (PRAVACHOL) 20 MG tablet Take 20 mg by mouth at bedtime.   Yes Historical Provider, MD  sulfamethoxazole-trimethoprim (BACTRIM DS,SEPTRA DS) 800-160 MG per tablet Take 1 tablet by mouth 2 (two) times daily. 05/07/14   Robert Bellow, MD    Allergies as of 08/15/2015 - Review Complete 08/14/2015  Allergen Reaction Noted  . Bupropion Nausea And Vomiting 11/22/2013  . Penicillins Hives 04/06/2012    Family History  Problem Relation Age of Onset  . Heart disease Mother   . Heart attack Mother   . Diabetes Mother   . Alzheimer's disease Father   . Breast cancer Sister 65  . Lung cancer Brother   . Brain cancer Brother   . Lung cancer Sister   . Bladder Cancer Sister   . Lung cancer Brother   . Leukemia Brother   . Diabetes Brother     Social History   Social History  . Marital Status: Widowed    Spouse Name: N/A  . Number of Children: N/A  . Years of Education: N/A   Occupational History  . Not on file.   Social History Main Topics  . Smoking status: Current Every Day  Smoker -- 1.00 packs/day for 30 years    Types: Cigarettes  . Smokeless tobacco: Not on file  . Alcohol Use: No  . Drug Use: No  . Sexual Activity: Not on file   Other Topics Concern  . Not on file   Social History Narrative    Review of Systems: See HPI, otherwise negative ROS  Physical Exam: BP 140/65 mmHg  Pulse 56  Temp(Src) 97.9 F (36.6 C) (Tympanic)  Resp 16  Ht 5\' 8"  (1.727 m)  Wt 195 lb (88.451 kg)  BMI 29.66 kg/m2  SpO2 99% General:   Alert,  pleasant and cooperative in NAD Head:  Normocephalic and atraumatic. Neck:  Supple; no masses or thyromegaly. Lungs:  Clear throughout to auscultation.    Heart:  Regular rate and rhythm. Abdomen:  Soft, nontender and nondistended. Normal bowel sounds, without guarding, and without rebound.   Neurologic:   Alert and  oriented x4;  grossly normal neurologically.  Impression/Plan: Shannon Li is here for an colonoscopy to be performed for elevated CEA  Risks, benefits, limitations, and alternatives regarding colonoscopy have been reviewed with the patient.  Questions have been answered.  All parties agreeable.   Trueman Worlds, Lupita Dawn, MD  08/26/2015, 1:04 PM

## 2015-08-26 NOTE — Anesthesia Preprocedure Evaluation (Signed)
Anesthesia Evaluation  Patient identified by MRN, date of birth, ID band Patient awake    Reviewed: Allergy & Precautions, H&P , NPO status , Patient's Chart, lab work & pertinent test results  History of Anesthesia Complications Negative for: history of anesthetic complications  Airway Mallampati: III  TM Distance: <3 FB Neck ROM: limited  Mouth opening: Limited Mouth Opening  Dental  (+) Poor Dentition   Pulmonary neg shortness of breath, COPD, Current Smoker,    Pulmonary exam normal breath sounds clear to auscultation       Cardiovascular Exercise Tolerance: Good hypertension, (-) angina+ Peripheral Vascular Disease  (-) Past MI and (-) DOE Normal cardiovascular exam Rhythm:regular Rate:Normal     Neuro/Psych PSYCHIATRIC DISORDERS Anxiety Depression negative neurological ROS     GI/Hepatic Neg liver ROS, hiatal hernia, GERD  Controlled,  Endo/Other  negative endocrine ROS  Renal/GU Renal disease  negative genitourinary   Musculoskeletal  (+) Arthritis ,   Abdominal   Peds  Hematology negative hematology ROS (+)   Anesthesia Other Findings Past Medical History:   Ovarian cyst                                                 Menopause                                                    Plantar fasciitis                                            Renal artery occlusion ([facility])                                 Hypertension                                                 IBS (irritable bowel syndrome)                               Colon polyp                                     2013         Renal insufficiency                                            Comment:Right kidney is small and not functioning.   Anxiety  Arthritis                                                    Atrophic kidney                                              Esophageal stricture                                          Chronic constipation                                         Depression                                                   COPD (chronic obstructive pulmonary disease) (*              Diverticulosis                                               Esophagitis                                                  History of hiatal hernia                                     Fibrocystic breast disease                                   GERD (gastroesophageal reflux disease)                       Postmenopausal                                              Past Surgical History:   CHOLECYSTECTOMY                                  2008         ABSCESS DRAINAGE                                 11/17/13        Comment:buttock right   COLONOSCOPY  2014           Comment:[facility] in [place]                               2014           Comment:[facility] in [place]   ESOPHAGOGASTRODUODENOSCOPY                      N/A 11/15/2014      Comment:Procedure: ESOPHAGOGASTRODUODENOSCOPY (EGD);                Surgeon: Hulen Luster, MD;  Location: [facility]               ENDOSCOPY;  Service: Gastroenterology;                Laterality: N/A;   BREAST BIOPSY                                   Right                Comment:neg  BMI    Body Mass Index   29.65 kg/m 2      Reproductive/Obstetrics negative OB ROS                             Anesthesia Physical Anesthesia Plan  ASA: III  Anesthesia Plan: General   Post-op Pain Management:    Induction:   Airway Management Planned:   Additional Equipment:   Intra-op Plan:   Post-operative Plan:   Informed Consent: I have reviewed the patients History and Physical, chart, labs and discussed the procedure including the risks, benefits and alternatives for the proposed anesthesia with the patient or authorized representative who has indicated  his/her understanding and acceptance.   Dental Advisory Given  Plan Discussed with: Anesthesiologist, CRNA and Surgeon  Anesthesia Plan Comments:         Anesthesia Quick Evaluation

## 2015-08-27 ENCOUNTER — Encounter: Payer: Self-pay | Admitting: Gastroenterology

## 2015-08-27 NOTE — Anesthesia Postprocedure Evaluation (Signed)
Anesthesia Post Note  Patient: Shannon Li  Procedure(s) Performed: Procedure(s) (LRB): COLONOSCOPY WITH PROPOFOL (N/A)  Patient location during evaluation: Endoscopy Anesthesia Type: General Level of consciousness: awake and alert Pain management: pain level controlled Vital Signs Assessment: post-procedure vital signs reviewed and stable Respiratory status: spontaneous breathing, nonlabored ventilation, respiratory function stable and patient connected to nasal cannula oxygen Cardiovascular status: blood pressure returned to baseline and stable Postop Assessment: no signs of nausea or vomiting Anesthetic complications: no    Last Vitals:  Filed Vitals:   08/26/15 1429 08/26/15 1438  BP: 138/85 148/75  Pulse:  50  Temp:    Resp: 16 17    Last Pain:  Filed Vitals:   08/27/15 0814  PainSc: 0-No pain                 Precious Haws Reynaldo Rossman

## 2015-09-14 DIAGNOSIS — J0100 Acute maxillary sinusitis, unspecified: Secondary | ICD-10-CM | POA: Diagnosis not present

## 2015-09-14 DIAGNOSIS — J441 Chronic obstructive pulmonary disease with (acute) exacerbation: Secondary | ICD-10-CM | POA: Diagnosis not present

## 2015-09-18 ENCOUNTER — Ambulatory Visit
Admission: RE | Admit: 2015-09-18 | Discharge: 2015-09-18 | Disposition: A | Discharge status: Home / Self Care | Payer: PPO | Source: Ambulatory Visit | Attending: Physician Assistant | Admitting: Physician Assistant

## 2015-09-18 ENCOUNTER — Other Ambulatory Visit: Payer: Self-pay | Admitting: Physician Assistant

## 2015-09-18 DIAGNOSIS — R0602 Shortness of breath: Secondary | ICD-10-CM

## 2015-09-18 DIAGNOSIS — R059 Cough, unspecified: Secondary | ICD-10-CM

## 2015-09-18 DIAGNOSIS — R05 Cough: Secondary | ICD-10-CM | POA: Diagnosis present

## 2015-09-20 DIAGNOSIS — F17210 Nicotine dependence, cigarettes, uncomplicated: Secondary | ICD-10-CM | POA: Diagnosis not present

## 2015-09-20 DIAGNOSIS — R062 Wheezing: Secondary | ICD-10-CM | POA: Diagnosis not present

## 2015-09-20 DIAGNOSIS — J069 Acute upper respiratory infection, unspecified: Secondary | ICD-10-CM | POA: Diagnosis not present

## 2015-10-01 DIAGNOSIS — H61001 Unspecified perichondritis of right external ear: Secondary | ICD-10-CM | POA: Diagnosis not present

## 2015-10-01 DIAGNOSIS — Z85828 Personal history of other malignant neoplasm of skin: Secondary | ICD-10-CM | POA: Diagnosis not present

## 2015-10-01 DIAGNOSIS — D485 Neoplasm of uncertain behavior of skin: Secondary | ICD-10-CM | POA: Diagnosis not present

## 2015-10-01 DIAGNOSIS — L570 Actinic keratosis: Secondary | ICD-10-CM | POA: Diagnosis not present

## 2015-10-01 DIAGNOSIS — D225 Melanocytic nevi of trunk: Secondary | ICD-10-CM | POA: Diagnosis not present

## 2015-10-01 DIAGNOSIS — X32XXXA Exposure to sunlight, initial encounter: Secondary | ICD-10-CM | POA: Diagnosis not present

## 2015-10-01 DIAGNOSIS — F17210 Nicotine dependence, cigarettes, uncomplicated: Secondary | ICD-10-CM | POA: Diagnosis not present

## 2015-10-01 DIAGNOSIS — D2262 Melanocytic nevi of left upper limb, including shoulder: Secondary | ICD-10-CM | POA: Diagnosis not present

## 2015-10-01 DIAGNOSIS — D0461 Carcinoma in situ of skin of right upper limb, including shoulder: Secondary | ICD-10-CM | POA: Diagnosis not present

## 2015-10-01 DIAGNOSIS — M545 Low back pain: Secondary | ICD-10-CM | POA: Diagnosis not present

## 2015-10-01 DIAGNOSIS — D2271 Melanocytic nevi of right lower limb, including hip: Secondary | ICD-10-CM | POA: Diagnosis not present

## 2015-10-01 DIAGNOSIS — I10 Essential (primary) hypertension: Secondary | ICD-10-CM | POA: Diagnosis not present

## 2015-10-03 DIAGNOSIS — M791 Myalgia: Secondary | ICD-10-CM | POA: Diagnosis not present

## 2015-10-03 DIAGNOSIS — M9904 Segmental and somatic dysfunction of sacral region: Secondary | ICD-10-CM | POA: Diagnosis not present

## 2015-10-03 DIAGNOSIS — M5137 Other intervertebral disc degeneration, lumbosacral region: Secondary | ICD-10-CM | POA: Diagnosis not present

## 2015-10-03 DIAGNOSIS — M9903 Segmental and somatic dysfunction of lumbar region: Secondary | ICD-10-CM | POA: Diagnosis not present

## 2015-10-03 DIAGNOSIS — M5136 Other intervertebral disc degeneration, lumbar region: Secondary | ICD-10-CM | POA: Diagnosis not present

## 2015-10-03 DIAGNOSIS — M9905 Segmental and somatic dysfunction of pelvic region: Secondary | ICD-10-CM | POA: Diagnosis not present

## 2015-10-03 DIAGNOSIS — M5441 Lumbago with sciatica, right side: Secondary | ICD-10-CM | POA: Diagnosis not present

## 2015-10-04 DIAGNOSIS — M9904 Segmental and somatic dysfunction of sacral region: Secondary | ICD-10-CM | POA: Diagnosis not present

## 2015-10-04 DIAGNOSIS — M9903 Segmental and somatic dysfunction of lumbar region: Secondary | ICD-10-CM | POA: Diagnosis not present

## 2015-10-04 DIAGNOSIS — M5136 Other intervertebral disc degeneration, lumbar region: Secondary | ICD-10-CM | POA: Diagnosis not present

## 2015-10-04 DIAGNOSIS — M9905 Segmental and somatic dysfunction of pelvic region: Secondary | ICD-10-CM | POA: Diagnosis not present

## 2015-10-04 DIAGNOSIS — M791 Myalgia: Secondary | ICD-10-CM | POA: Diagnosis not present

## 2015-10-04 DIAGNOSIS — M5137 Other intervertebral disc degeneration, lumbosacral region: Secondary | ICD-10-CM | POA: Diagnosis not present

## 2015-10-04 DIAGNOSIS — M5441 Lumbago with sciatica, right side: Secondary | ICD-10-CM | POA: Diagnosis not present

## 2015-10-05 DIAGNOSIS — M5136 Other intervertebral disc degeneration, lumbar region: Secondary | ICD-10-CM | POA: Diagnosis not present

## 2015-10-05 DIAGNOSIS — M5431 Sciatica, right side: Secondary | ICD-10-CM | POA: Diagnosis not present

## 2015-10-07 DIAGNOSIS — M5136 Other intervertebral disc degeneration, lumbar region: Secondary | ICD-10-CM | POA: Diagnosis not present

## 2015-10-07 DIAGNOSIS — M791 Myalgia: Secondary | ICD-10-CM | POA: Diagnosis not present

## 2015-10-07 DIAGNOSIS — M9904 Segmental and somatic dysfunction of sacral region: Secondary | ICD-10-CM | POA: Diagnosis not present

## 2015-10-07 DIAGNOSIS — M5441 Lumbago with sciatica, right side: Secondary | ICD-10-CM | POA: Diagnosis not present

## 2015-10-07 DIAGNOSIS — M9905 Segmental and somatic dysfunction of pelvic region: Secondary | ICD-10-CM | POA: Diagnosis not present

## 2015-10-07 DIAGNOSIS — M5137 Other intervertebral disc degeneration, lumbosacral region: Secondary | ICD-10-CM | POA: Diagnosis not present

## 2015-10-07 DIAGNOSIS — M9903 Segmental and somatic dysfunction of lumbar region: Secondary | ICD-10-CM | POA: Diagnosis not present

## 2015-10-08 DIAGNOSIS — M5441 Lumbago with sciatica, right side: Secondary | ICD-10-CM | POA: Diagnosis not present

## 2015-10-08 DIAGNOSIS — M9905 Segmental and somatic dysfunction of pelvic region: Secondary | ICD-10-CM | POA: Diagnosis not present

## 2015-10-08 DIAGNOSIS — M5137 Other intervertebral disc degeneration, lumbosacral region: Secondary | ICD-10-CM | POA: Diagnosis not present

## 2015-10-08 DIAGNOSIS — M9904 Segmental and somatic dysfunction of sacral region: Secondary | ICD-10-CM | POA: Diagnosis not present

## 2015-10-08 DIAGNOSIS — M5136 Other intervertebral disc degeneration, lumbar region: Secondary | ICD-10-CM | POA: Diagnosis not present

## 2015-10-08 DIAGNOSIS — M9903 Segmental and somatic dysfunction of lumbar region: Secondary | ICD-10-CM | POA: Diagnosis not present

## 2015-10-08 DIAGNOSIS — M791 Myalgia: Secondary | ICD-10-CM | POA: Diagnosis not present

## 2015-10-09 DIAGNOSIS — M9903 Segmental and somatic dysfunction of lumbar region: Secondary | ICD-10-CM | POA: Diagnosis not present

## 2015-10-09 DIAGNOSIS — M9904 Segmental and somatic dysfunction of sacral region: Secondary | ICD-10-CM | POA: Diagnosis not present

## 2015-10-09 DIAGNOSIS — M5441 Lumbago with sciatica, right side: Secondary | ICD-10-CM | POA: Diagnosis not present

## 2015-10-09 DIAGNOSIS — M5137 Other intervertebral disc degeneration, lumbosacral region: Secondary | ICD-10-CM | POA: Diagnosis not present

## 2015-10-09 DIAGNOSIS — M791 Myalgia: Secondary | ICD-10-CM | POA: Diagnosis not present

## 2015-10-09 DIAGNOSIS — M9905 Segmental and somatic dysfunction of pelvic region: Secondary | ICD-10-CM | POA: Diagnosis not present

## 2015-10-09 DIAGNOSIS — M5136 Other intervertebral disc degeneration, lumbar region: Secondary | ICD-10-CM | POA: Diagnosis not present

## 2015-10-10 DIAGNOSIS — M9904 Segmental and somatic dysfunction of sacral region: Secondary | ICD-10-CM | POA: Diagnosis not present

## 2015-10-10 DIAGNOSIS — M791 Myalgia: Secondary | ICD-10-CM | POA: Diagnosis not present

## 2015-10-10 DIAGNOSIS — M5136 Other intervertebral disc degeneration, lumbar region: Secondary | ICD-10-CM | POA: Diagnosis not present

## 2015-10-10 DIAGNOSIS — M5137 Other intervertebral disc degeneration, lumbosacral region: Secondary | ICD-10-CM | POA: Diagnosis not present

## 2015-10-10 DIAGNOSIS — M9905 Segmental and somatic dysfunction of pelvic region: Secondary | ICD-10-CM | POA: Diagnosis not present

## 2015-10-10 DIAGNOSIS — M5441 Lumbago with sciatica, right side: Secondary | ICD-10-CM | POA: Diagnosis not present

## 2015-10-10 DIAGNOSIS — M9903 Segmental and somatic dysfunction of lumbar region: Secondary | ICD-10-CM | POA: Diagnosis not present

## 2015-10-11 DIAGNOSIS — M9903 Segmental and somatic dysfunction of lumbar region: Secondary | ICD-10-CM | POA: Diagnosis not present

## 2015-10-11 DIAGNOSIS — M5137 Other intervertebral disc degeneration, lumbosacral region: Secondary | ICD-10-CM | POA: Diagnosis not present

## 2015-10-11 DIAGNOSIS — M5136 Other intervertebral disc degeneration, lumbar region: Secondary | ICD-10-CM | POA: Diagnosis not present

## 2015-10-11 DIAGNOSIS — M9904 Segmental and somatic dysfunction of sacral region: Secondary | ICD-10-CM | POA: Diagnosis not present

## 2015-10-11 DIAGNOSIS — M9905 Segmental and somatic dysfunction of pelvic region: Secondary | ICD-10-CM | POA: Diagnosis not present

## 2015-10-11 DIAGNOSIS — M5441 Lumbago with sciatica, right side: Secondary | ICD-10-CM | POA: Diagnosis not present

## 2015-10-11 DIAGNOSIS — M791 Myalgia: Secondary | ICD-10-CM | POA: Diagnosis not present

## 2015-10-15 DIAGNOSIS — M791 Myalgia: Secondary | ICD-10-CM | POA: Diagnosis not present

## 2015-10-15 DIAGNOSIS — M5441 Lumbago with sciatica, right side: Secondary | ICD-10-CM | POA: Diagnosis not present

## 2015-10-15 DIAGNOSIS — M9903 Segmental and somatic dysfunction of lumbar region: Secondary | ICD-10-CM | POA: Diagnosis not present

## 2015-10-15 DIAGNOSIS — M9904 Segmental and somatic dysfunction of sacral region: Secondary | ICD-10-CM | POA: Diagnosis not present

## 2015-10-15 DIAGNOSIS — M9905 Segmental and somatic dysfunction of pelvic region: Secondary | ICD-10-CM | POA: Diagnosis not present

## 2015-10-15 DIAGNOSIS — M5136 Other intervertebral disc degeneration, lumbar region: Secondary | ICD-10-CM | POA: Diagnosis not present

## 2015-10-15 DIAGNOSIS — M5137 Other intervertebral disc degeneration, lumbosacral region: Secondary | ICD-10-CM | POA: Diagnosis not present

## 2015-10-25 DIAGNOSIS — E782 Mixed hyperlipidemia: Secondary | ICD-10-CM | POA: Diagnosis not present

## 2015-10-25 DIAGNOSIS — I10 Essential (primary) hypertension: Secondary | ICD-10-CM | POA: Diagnosis not present

## 2015-10-25 DIAGNOSIS — Z0001 Encounter for general adult medical examination with abnormal findings: Secondary | ICD-10-CM | POA: Diagnosis not present

## 2015-10-25 DIAGNOSIS — M5441 Lumbago with sciatica, right side: Secondary | ICD-10-CM | POA: Diagnosis not present

## 2015-10-25 DIAGNOSIS — N760 Acute vaginitis: Secondary | ICD-10-CM | POA: Diagnosis not present

## 2015-11-01 DIAGNOSIS — M25551 Pain in right hip: Secondary | ICD-10-CM | POA: Diagnosis not present

## 2015-11-01 DIAGNOSIS — M79604 Pain in right leg: Secondary | ICD-10-CM | POA: Diagnosis not present

## 2015-11-01 DIAGNOSIS — M545 Low back pain: Secondary | ICD-10-CM | POA: Diagnosis not present

## 2015-11-04 DIAGNOSIS — D0461 Carcinoma in situ of skin of right upper limb, including shoulder: Secondary | ICD-10-CM | POA: Diagnosis not present

## 2015-11-14 ENCOUNTER — Other Ambulatory Visit: Payer: Self-pay | Admitting: Orthopaedic Surgery

## 2015-11-14 DIAGNOSIS — M545 Low back pain: Secondary | ICD-10-CM

## 2015-11-16 ENCOUNTER — Ambulatory Visit
Admission: RE | Admit: 2015-11-16 | Discharge: 2015-11-16 | Disposition: A | Discharge status: Home / Self Care | Payer: PPO | Source: Ambulatory Visit | Attending: Orthopaedic Surgery | Admitting: Orthopaedic Surgery

## 2015-11-16 DIAGNOSIS — M545 Low back pain: Secondary | ICD-10-CM

## 2015-11-16 DIAGNOSIS — S3992XA Unspecified injury of lower back, initial encounter: Secondary | ICD-10-CM | POA: Diagnosis not present

## 2015-11-16 DIAGNOSIS — M4806 Spinal stenosis, lumbar region: Secondary | ICD-10-CM | POA: Insufficient documentation

## 2015-11-16 DIAGNOSIS — M5126 Other intervertebral disc displacement, lumbar region: Secondary | ICD-10-CM | POA: Insufficient documentation

## 2015-11-26 DIAGNOSIS — M545 Low back pain: Secondary | ICD-10-CM | POA: Diagnosis not present

## 2015-11-26 DIAGNOSIS — M25551 Pain in right hip: Secondary | ICD-10-CM | POA: Diagnosis not present

## 2015-12-30 DIAGNOSIS — M5416 Radiculopathy, lumbar region: Secondary | ICD-10-CM | POA: Diagnosis not present

## 2015-12-30 DIAGNOSIS — M545 Low back pain: Secondary | ICD-10-CM | POA: Diagnosis not present

## 2016-01-03 DIAGNOSIS — M5416 Radiculopathy, lumbar region: Secondary | ICD-10-CM | POA: Diagnosis not present

## 2016-01-03 DIAGNOSIS — M545 Low back pain: Secondary | ICD-10-CM | POA: Diagnosis not present

## 2016-01-03 DIAGNOSIS — M79604 Pain in right leg: Secondary | ICD-10-CM | POA: Diagnosis not present

## 2016-01-03 DIAGNOSIS — M25551 Pain in right hip: Secondary | ICD-10-CM | POA: Diagnosis not present

## 2016-02-11 ENCOUNTER — Other Ambulatory Visit: Payer: Self-pay | Admitting: Physician Assistant

## 2016-02-11 DIAGNOSIS — Z1231 Encounter for screening mammogram for malignant neoplasm of breast: Secondary | ICD-10-CM

## 2016-03-06 ENCOUNTER — Ambulatory Visit (INDEPENDENT_AMBULATORY_CARE_PROVIDER_SITE_OTHER): Payer: Self-pay | Admitting: Orthopaedic Surgery

## 2016-03-11 DIAGNOSIS — Z23 Encounter for immunization: Secondary | ICD-10-CM | POA: Diagnosis not present

## 2016-03-16 DIAGNOSIS — F17210 Nicotine dependence, cigarettes, uncomplicated: Secondary | ICD-10-CM | POA: Diagnosis not present

## 2016-03-16 DIAGNOSIS — Z124 Encounter for screening for malignant neoplasm of cervix: Secondary | ICD-10-CM | POA: Diagnosis not present

## 2016-03-16 DIAGNOSIS — I10 Essential (primary) hypertension: Secondary | ICD-10-CM | POA: Diagnosis not present

## 2016-03-16 DIAGNOSIS — N83202 Unspecified ovarian cyst, left side: Secondary | ICD-10-CM | POA: Diagnosis not present

## 2016-03-16 DIAGNOSIS — M5441 Lumbago with sciatica, right side: Secondary | ICD-10-CM | POA: Diagnosis not present

## 2016-03-16 DIAGNOSIS — Z0001 Encounter for general adult medical examination with abnormal findings: Secondary | ICD-10-CM | POA: Diagnosis not present

## 2016-03-16 DIAGNOSIS — R0989 Other specified symptoms and signs involving the circulatory and respiratory systems: Secondary | ICD-10-CM | POA: Diagnosis not present

## 2016-03-23 ENCOUNTER — Ambulatory Visit
Admission: RE | Admit: 2016-03-23 | Discharge: 2016-03-23 | Disposition: A | Discharge status: Home / Self Care | Payer: PPO | Source: Ambulatory Visit | Attending: Physician Assistant | Admitting: Physician Assistant

## 2016-03-23 DIAGNOSIS — L570 Actinic keratosis: Secondary | ICD-10-CM | POA: Diagnosis not present

## 2016-03-23 DIAGNOSIS — Z85828 Personal history of other malignant neoplasm of skin: Secondary | ICD-10-CM | POA: Diagnosis not present

## 2016-03-23 DIAGNOSIS — L821 Other seborrheic keratosis: Secondary | ICD-10-CM | POA: Diagnosis not present

## 2016-03-23 DIAGNOSIS — L820 Inflamed seborrheic keratosis: Secondary | ICD-10-CM | POA: Diagnosis not present

## 2016-03-23 DIAGNOSIS — I8311 Varicose veins of right lower extremity with inflammation: Secondary | ICD-10-CM | POA: Diagnosis not present

## 2016-03-23 DIAGNOSIS — X32XXXA Exposure to sunlight, initial encounter: Secondary | ICD-10-CM | POA: Diagnosis not present

## 2016-03-23 DIAGNOSIS — S20309A Unspecified superficial injuries of unspecified front wall of thorax, initial encounter: Secondary | ICD-10-CM | POA: Diagnosis not present

## 2016-03-23 DIAGNOSIS — Z1231 Encounter for screening mammogram for malignant neoplasm of breast: Secondary | ICD-10-CM | POA: Diagnosis present

## 2016-03-23 DIAGNOSIS — L853 Xerosis cutis: Secondary | ICD-10-CM | POA: Diagnosis not present

## 2016-03-23 DIAGNOSIS — L538 Other specified erythematous conditions: Secondary | ICD-10-CM | POA: Diagnosis not present

## 2016-03-25 DIAGNOSIS — N83202 Unspecified ovarian cyst, left side: Secondary | ICD-10-CM | POA: Diagnosis not present

## 2016-04-22 DIAGNOSIS — R55 Syncope and collapse: Secondary | ICD-10-CM | POA: Diagnosis not present

## 2016-06-16 DIAGNOSIS — R05 Cough: Secondary | ICD-10-CM | POA: Diagnosis not present

## 2016-06-16 DIAGNOSIS — K219 Gastro-esophageal reflux disease without esophagitis: Secondary | ICD-10-CM | POA: Diagnosis not present

## 2016-06-16 DIAGNOSIS — J069 Acute upper respiratory infection, unspecified: Secondary | ICD-10-CM | POA: Diagnosis not present

## 2016-06-16 DIAGNOSIS — J441 Chronic obstructive pulmonary disease with (acute) exacerbation: Secondary | ICD-10-CM | POA: Diagnosis not present

## 2016-06-16 DIAGNOSIS — I10 Essential (primary) hypertension: Secondary | ICD-10-CM | POA: Diagnosis not present

## 2016-06-16 DIAGNOSIS — R062 Wheezing: Secondary | ICD-10-CM | POA: Diagnosis not present

## 2016-07-15 ENCOUNTER — Other Ambulatory Visit: Payer: Self-pay | Admitting: Gastroenterology

## 2016-07-15 DIAGNOSIS — R1310 Dysphagia, unspecified: Secondary | ICD-10-CM | POA: Diagnosis not present

## 2016-07-15 DIAGNOSIS — R6881 Early satiety: Secondary | ICD-10-CM | POA: Diagnosis not present

## 2016-07-15 DIAGNOSIS — R1013 Epigastric pain: Secondary | ICD-10-CM

## 2016-07-15 DIAGNOSIS — R110 Nausea: Secondary | ICD-10-CM

## 2016-07-29 IMAGING — CR DG HIP (WITH OR WITHOUT PELVIS) 2-3V*R*
1 series · 3 of 3 positions shown · non-contrast
Comparison: Abdominal and pelvic CT scan dated November 01, 2013

CLINICAL DATA: Two weeks of right hip pain centered in the greater
trochanteric region with some radiation inferiorly along the lateral
aspect of phi. No known injury.

EXAM:
DG HIP (WITH OR WITHOUT PELVIS) 2-3V RIGHT

[Series 1: view not recorded · 0.14mm/px · 3 of 3 slices shown]
[im 1/3]
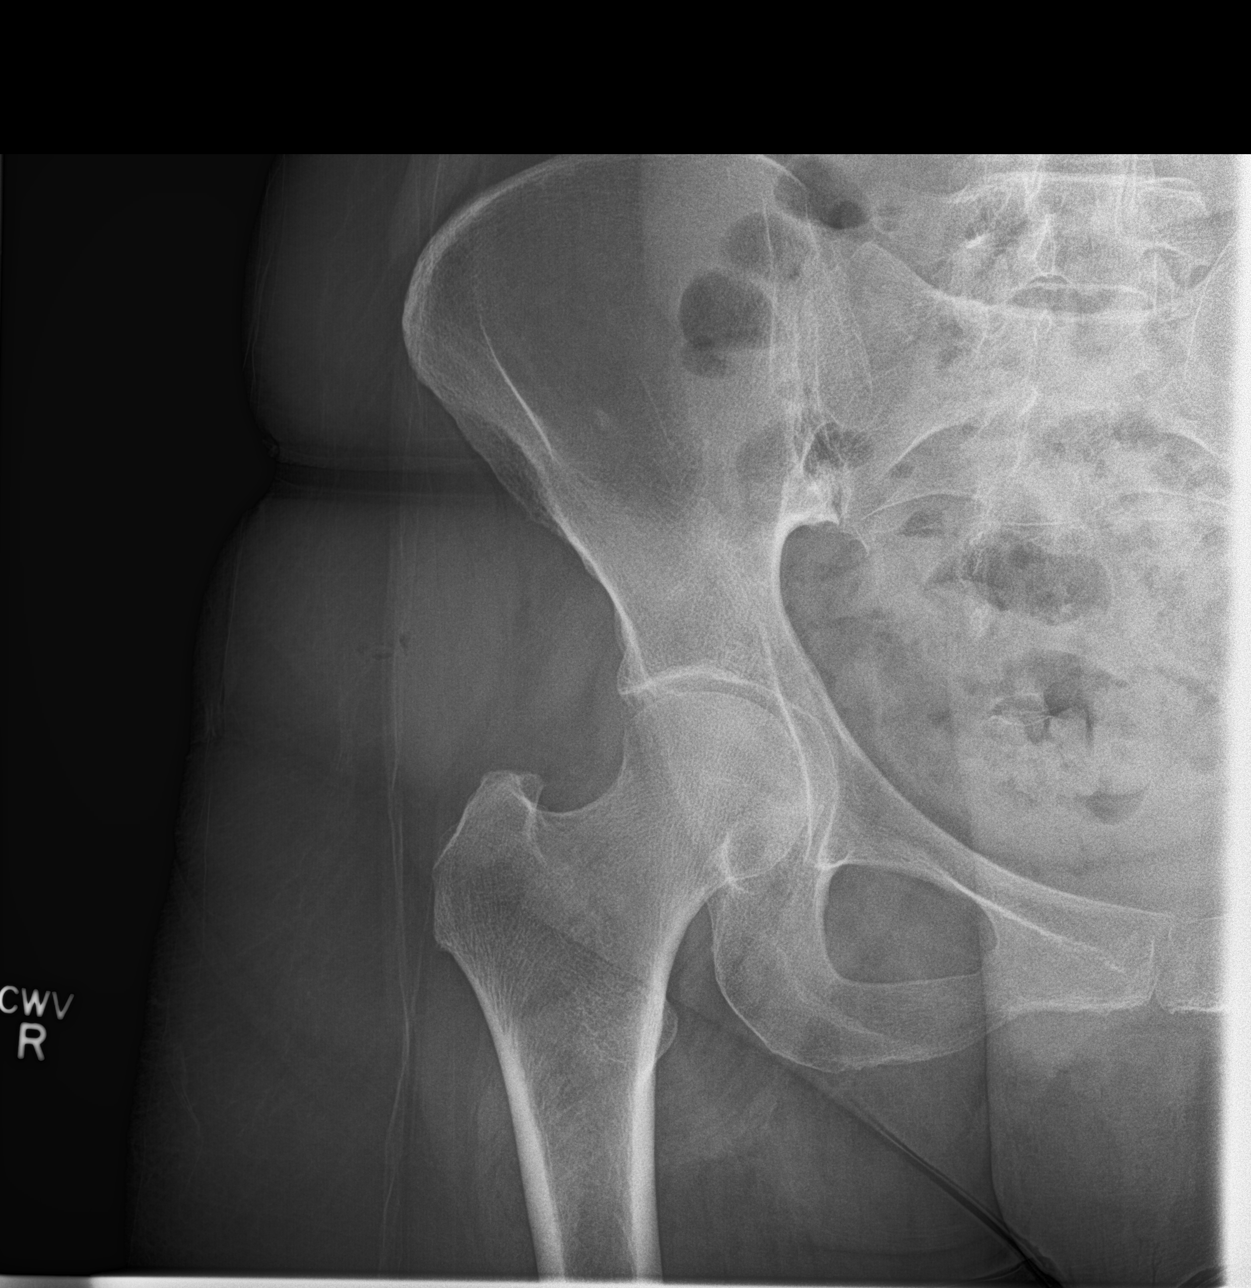
[im 2/3]
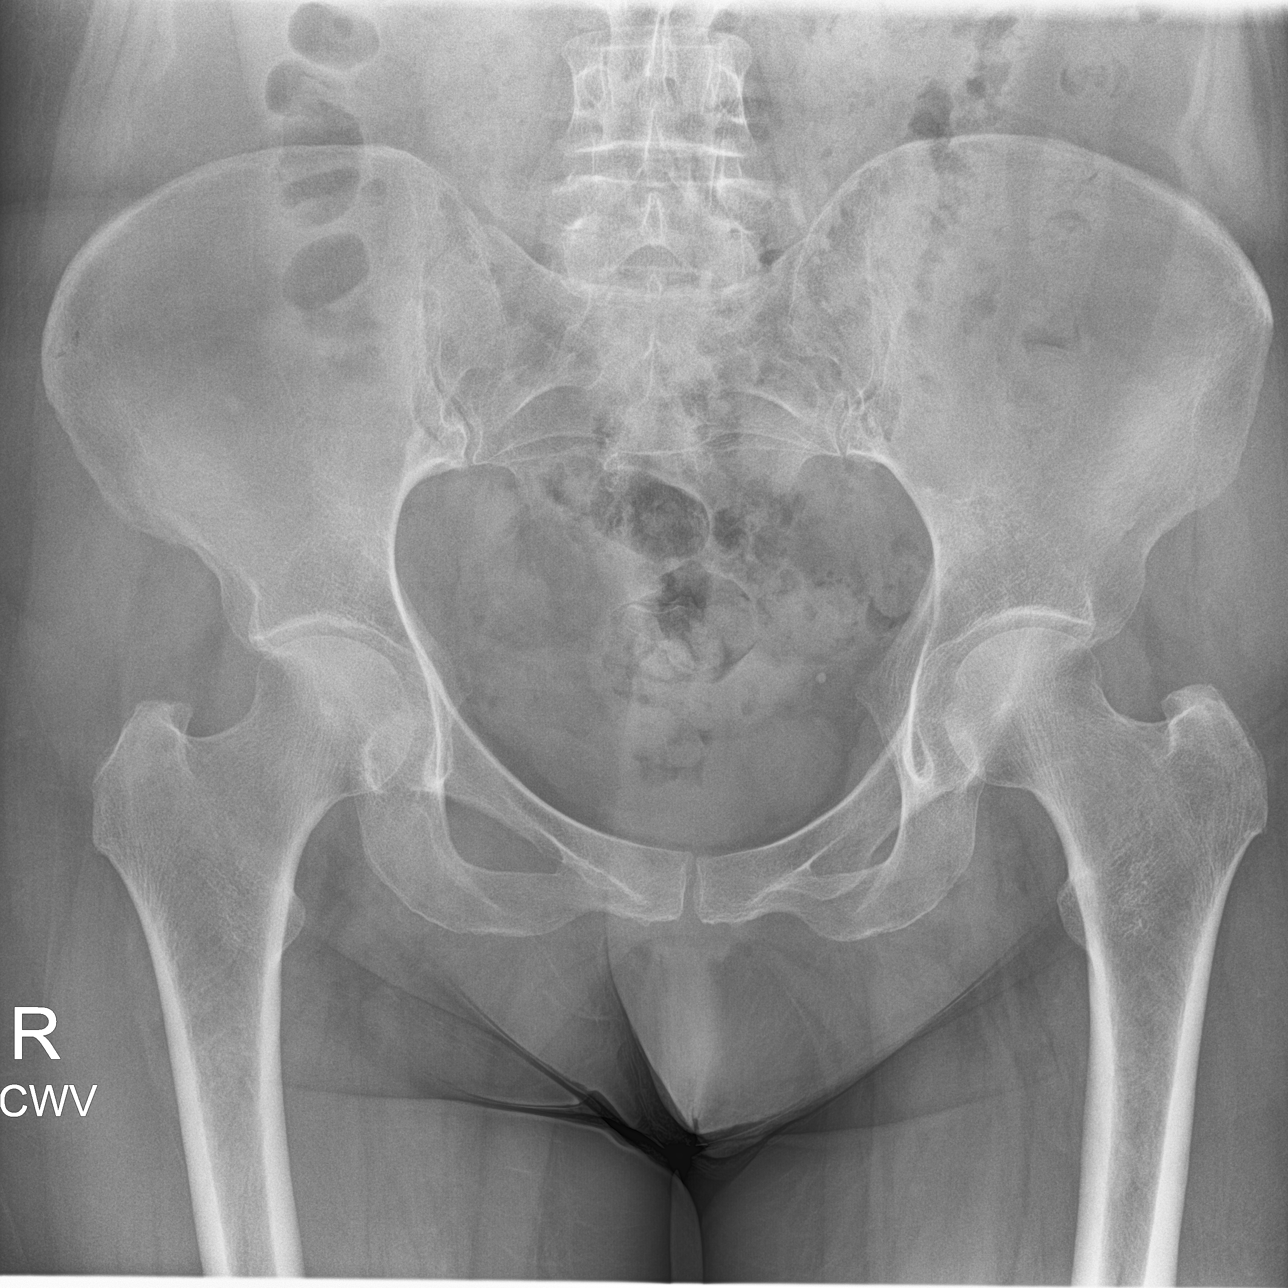
[im 3/3]
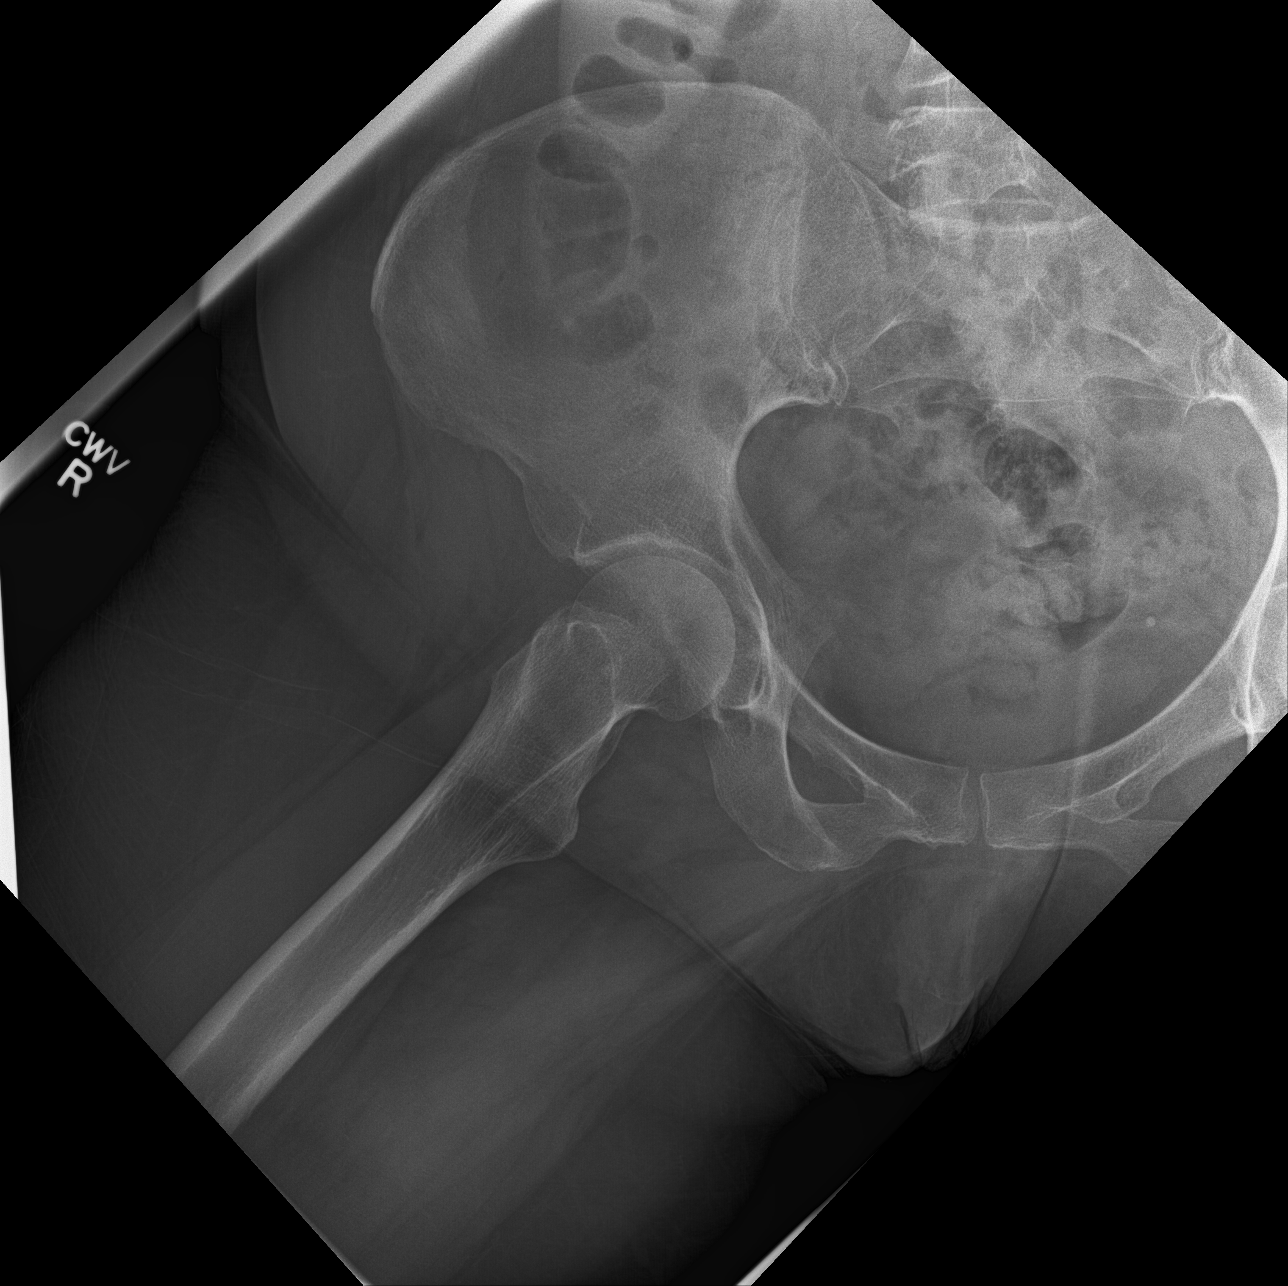

[3 of 3 positions shown; findings below may reference images not displayed]

FINDINGS: The bony pelvis is adequately mineralized. There is no lytic or
blastic lesion. There is no acute or old fracture. AP and lateral
views of the right hip reveal the joint space to be preserved. The
articular surfaces remains smoothly rounded. The femoral neck,
intertrochanteric, and subtrochanteric regions are normal.
IMPRESSION: There is no acute or chronic bony abnormality of the right hip.

## 2016-08-25 ENCOUNTER — Ambulatory Visit
Admission: RE | Admit: 2016-08-25 | Discharge: 2016-08-25 | Disposition: A | Discharge status: Home / Self Care | Payer: PPO | Source: Ambulatory Visit | Attending: Gastroenterology | Admitting: Gastroenterology

## 2016-08-25 DIAGNOSIS — R1013 Epigastric pain: Secondary | ICD-10-CM | POA: Diagnosis present

## 2016-08-25 DIAGNOSIS — R110 Nausea: Secondary | ICD-10-CM | POA: Insufficient documentation

## 2016-08-25 DIAGNOSIS — R6881 Early satiety: Secondary | ICD-10-CM

## 2016-08-25 MED ORDER — TECHNETIUM TC 99M SULFUR COLLOID
2.0000 | Freq: Once | INTRAVENOUS | Status: AC | PRN
  Administered 2016-08-25: 2.48 via INTRAVENOUS

## 2016-09-24 DIAGNOSIS — I739 Peripheral vascular disease, unspecified: Secondary | ICD-10-CM | POA: Diagnosis not present

## 2016-09-24 DIAGNOSIS — F411 Generalized anxiety disorder: Secondary | ICD-10-CM | POA: Diagnosis not present

## 2016-09-24 DIAGNOSIS — I10 Essential (primary) hypertension: Secondary | ICD-10-CM | POA: Diagnosis not present

## 2016-09-24 DIAGNOSIS — F17210 Nicotine dependence, cigarettes, uncomplicated: Secondary | ICD-10-CM | POA: Diagnosis not present

## 2016-09-24 DIAGNOSIS — R911 Solitary pulmonary nodule: Secondary | ICD-10-CM | POA: Diagnosis not present

## 2016-10-14 DIAGNOSIS — R600 Localized edema: Secondary | ICD-10-CM | POA: Diagnosis not present

## 2016-10-14 DIAGNOSIS — F411 Generalized anxiety disorder: Secondary | ICD-10-CM | POA: Diagnosis not present

## 2016-10-14 DIAGNOSIS — F17210 Nicotine dependence, cigarettes, uncomplicated: Secondary | ICD-10-CM | POA: Diagnosis not present

## 2016-10-14 DIAGNOSIS — I10 Essential (primary) hypertension: Secondary | ICD-10-CM | POA: Diagnosis not present

## 2016-10-14 DIAGNOSIS — R300 Dysuria: Secondary | ICD-10-CM | POA: Diagnosis not present

## 2016-11-26 IMAGING — CR DG CHEST 2V
1 series · 2 of 2 positions shown · non-contrast
Comparison: June 19, 2014.

CLINICAL DATA: Nonproductive cough.

EXAM:
CHEST  2 VIEW

[Series 1: dg chest 2 view · 0.14mm/px · 2 of 2 slices shown]
[im 1/2]
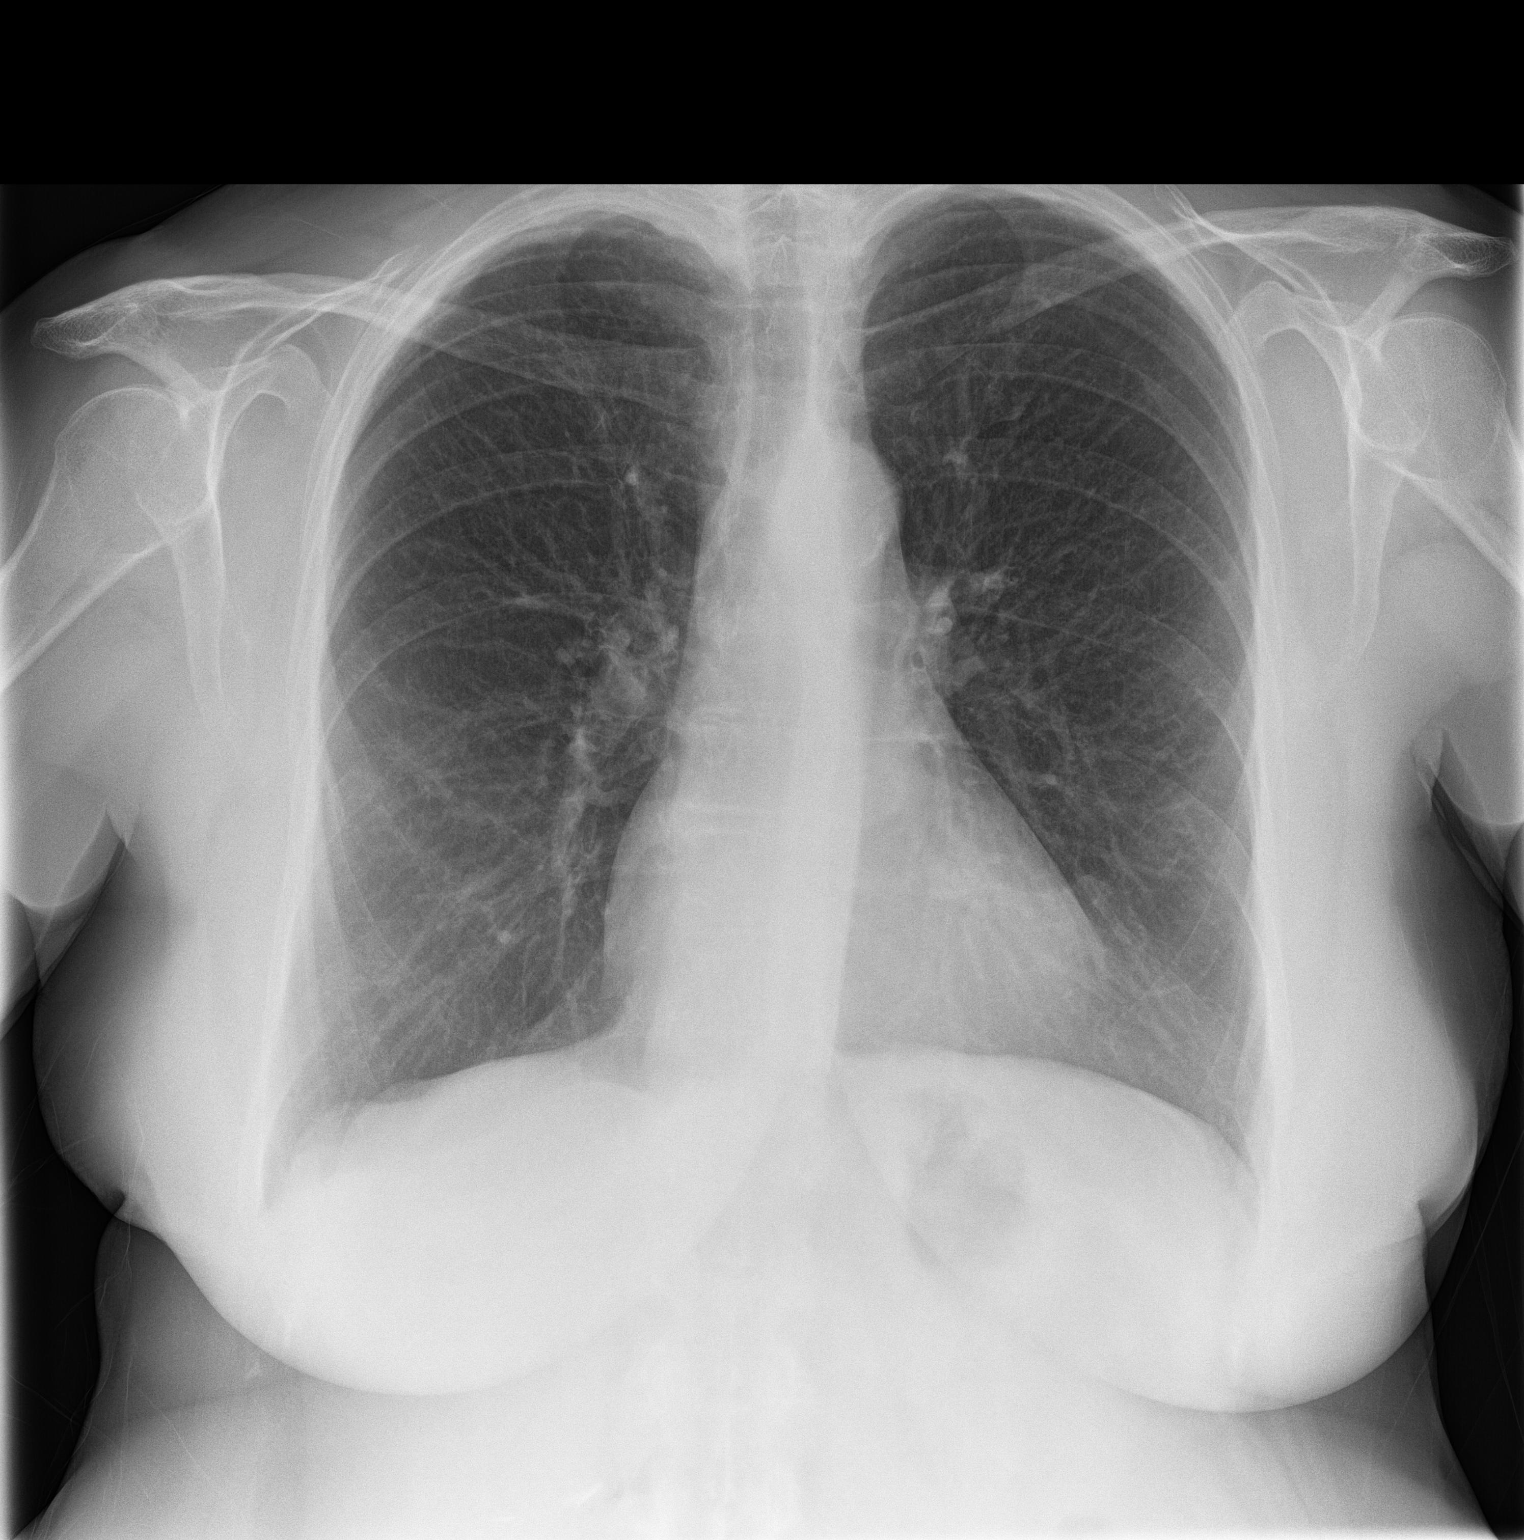
[im 2/2]
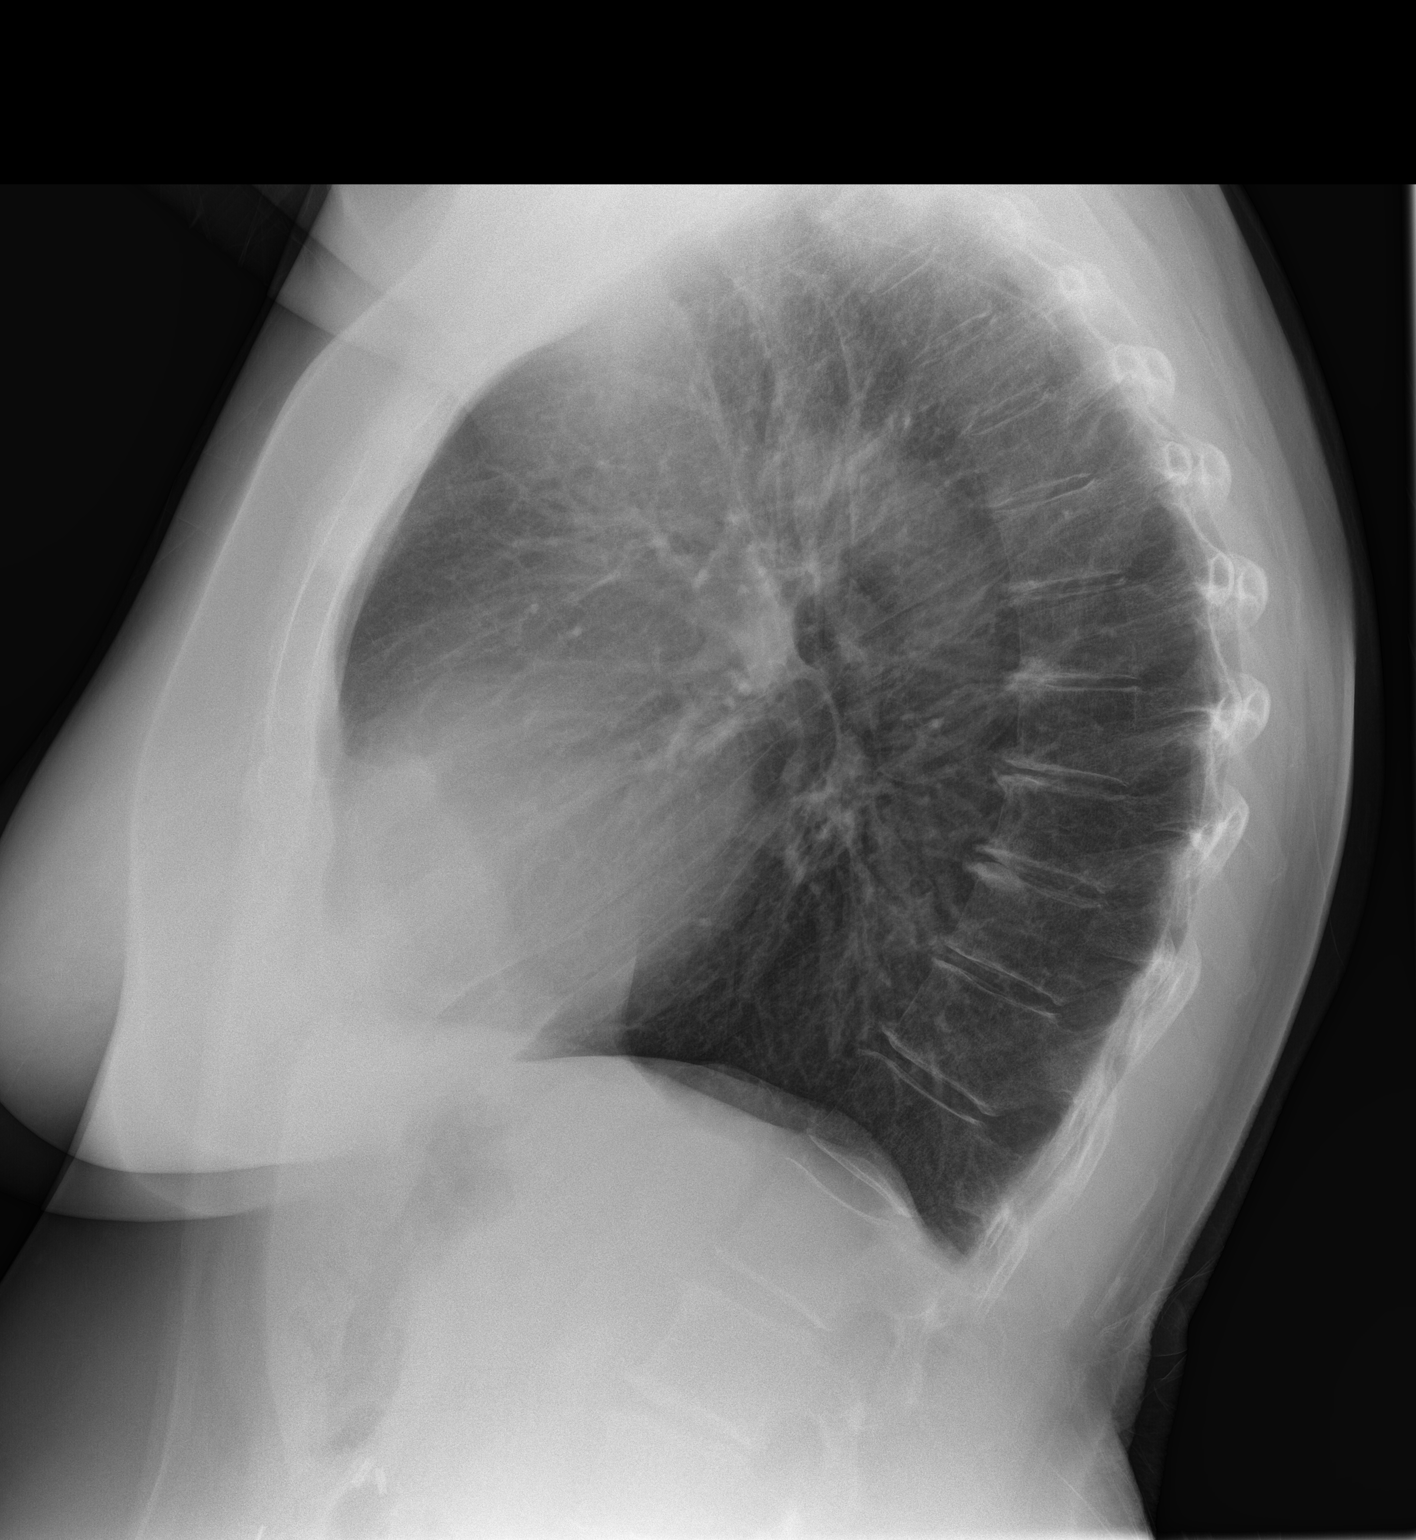

[2 of 2 positions shown; findings below may reference images not displayed]

FINDINGS: The heart size and mediastinal contours are within normal limits.
Both lungs are clear. No pneumothorax or pleural effusion is noted.
The visualized skeletal structures are unremarkable.
IMPRESSION: No active cardiopulmonary disease.

## 2016-12-14 DIAGNOSIS — C44622 Squamous cell carcinoma of skin of right upper limb, including shoulder: Secondary | ICD-10-CM | POA: Diagnosis not present

## 2016-12-22 ENCOUNTER — Ambulatory Visit (INDEPENDENT_AMBULATORY_CARE_PROVIDER_SITE_OTHER): Payer: Medicare Other | Admitting: Orthopaedic Surgery

## 2016-12-22 ENCOUNTER — Encounter (INDEPENDENT_AMBULATORY_CARE_PROVIDER_SITE_OTHER): Payer: Self-pay | Admitting: Orthopaedic Surgery

## 2016-12-22 ENCOUNTER — Ambulatory Visit (INDEPENDENT_AMBULATORY_CARE_PROVIDER_SITE_OTHER): Payer: Medicare Other

## 2016-12-22 VITALS — BP 135/90 | HR 61 | Ht 68.0 in | Wt 190.0 lb

## 2016-12-22 DIAGNOSIS — M545 Low back pain: Secondary | ICD-10-CM | POA: Diagnosis not present

## 2016-12-22 DIAGNOSIS — M25551 Pain in right hip: Secondary | ICD-10-CM

## 2016-12-22 DIAGNOSIS — M5126 Other intervertebral disc displacement, lumbar region: Secondary | ICD-10-CM

## 2016-12-23 NOTE — Progress Notes (Signed)
Office Visit Note   Patient: Shannon Li           Date of Birth: Aug 23, 1950           MRN: [id number] Visit Date: 12/22/2016              Requested by: Lavera Guise, MD [address] [place], [place] [address] PCP: Lavera Guise, MD   Assessment & Plan: Visit Diagnoses:  1. Pain in right hip   2. Acute right-sided low back pain, with sciatica presence unspecified   3. Protrusion of lumbar intervertebral disc     Plan: We'll repeat right L4-5 epidural with Dr. Ernestina Patches she got many months of relief previously. If this is not effective on return we may need to consider repeat imaging. She will call if she has persistent symptoms.  Follow-Up Instructions: Return if symptoms worsen or fail to improve.   Orders:  Orders Placed This Encounter  Procedures  . XR HIP UNILAT W OR W/O PELVIS 2-3 VIEWS RIGHT  . Ambulatory referral to Physical Medicine Rehab   No orders of the defined types were placed in this encounter.     Procedures: No procedures performed   Clinical Data: No additional findings.   Subjective: Chief Complaint  Patient presents with  . Lower Back - Pain  . Right Hip - Pain    HPI 66 year old female returns with ongoing problems with back and right hip pain. She had previous injection with Dr. Ernestina Patches right L4 transforaminal on 12/30/2015 with good relief for several months. She's had recurrence of pain over the last month the radiates under her right thigh not past her knee sometimes the pain is anteriorly in the groin. She has difficulty with rotation and movement as well as flexion. Previous MRI 11/16/2015 showed moderate to large right foraminal and extraforaminal disc protrusion at the L4-5 level. Patient denies fever chills no bowel or bladder symptoms. Remote history of an inguinal abscess.  Review of Systems  Constitutional: Negative for chills and diaphoresis.  HENT: Negative for ear discharge, ear pain and nosebleeds.   Eyes: Negative for  discharge and visual disturbance.  Respiratory: Negative for cough, choking and shortness of breath.   Cardiovascular: Negative for chest pain and palpitations.  Gastrointestinal: Negative for abdominal distention and abdominal pain.  Endocrine: Negative for cold intolerance and heat intolerance.  Genitourinary: Negative for flank pain and hematuria.  Musculoskeletal: Positive for back pain.  Skin: Negative for rash and wound.  Neurological: Negative for seizures and speech difficulty.  Hematological: Negative for adenopathy. Does not bruise/bleed easily.  Psychiatric/Behavioral: Negative for agitation and suicidal ideas.     Objective: Vital Signs: BP 135/90   Pulse 61   Ht 5\' 8"  (1.727 m)   Wt 190 lb (86.2 kg)   BMI 28.89 kg/m   Physical Exam  Constitutional: She is oriented to person, place, and time. She appears well-developed.  HENT:  Head: Normocephalic.  Right Ear: External ear normal.  Left Ear: External ear normal.  Eyes: Pupils are equal, round, and reactive to light.  Neck: No tracheal deviation present. No thyromegaly present.  Cardiovascular: Normal rate.   Pulmonary/Chest: Effort normal.  Abdominal: Soft.  Musculoskeletal:  Quad set flexions are strong no pain with internal/external rotation of her hips. She is tenderness of the trochanter. Slight decrease sensation over the anterior lateral right side. Anterior tib is strong no calf atrophy EHL and peroneals and anterior tib are all normal to resistive testing right  and left distal pulses are palpable negative Homans no rash or exposed skin.  Neurological: She is alert and oriented to person, place, and time.  Skin: Skin is warm and dry.  Psychiatric: She has a normal mood and affect. Her behavior is normal.    Ortho Exam  Specialty Comments:  No specialty comments available.  Imaging: Xr Hip Unilat W Or W/o Pelvis 2-3 Views Right  Result Date: 12/23/2016 AP pelvis and hips and frog leg right hip x-rays  obtained and reviewed. This shows no osteoarthritis negative for acute fracture no soft tissue calcification. Impression: Normal pelvis and right hip x-rays without degenerative changes    PMFS History: Patient Active Problem List   Diagnosis Date Noted  . Inguinal abscess 05/23/2014  . Ovarian cyst, complex 04/06/2012   Past Medical History:  Diagnosis Date  . Anxiety   . Arthritis   . Atrophic kidney   . Chronic constipation   . Colon polyp 2013  . COPD (chronic obstructive pulmonary disease) ([place])   . Depression   . Diverticulosis   . Esophageal stricture   . Esophagitis   . Fibrocystic breast disease   . GERD (gastroesophageal reflux disease)   . History of hiatal hernia   . Hypertension   . IBS (irritable bowel syndrome)   . Menopause   . Ovarian cyst   . Plantar fasciitis   . Postmenopausal   . Renal artery occlusion ([place])   . Renal insufficiency    Right kidney is small and not functioning.    Family History  Problem Relation Age of Onset  . Heart disease Mother   . Heart attack Mother   . Diabetes Mother   . Alzheimer's disease Father   . Breast cancer Sister 18  . Lung cancer Brother   . Brain cancer Brother   . Lung cancer Sister   . Bladder Cancer Sister   . Lung cancer Brother   . Leukemia Brother   . Diabetes Brother     Past Surgical History:  Procedure Laterality Date  . ABSCESS DRAINAGE  11/17/13   buttock right  . BREAST BIOPSY Right    neg  . CHOLECYSTECTOMY  2008  . COLONOSCOPY  2014   [facility] in [place]  . COLONOSCOPY WITH PROPOFOL N/A 08/26/2015   Procedure: COLONOSCOPY WITH PROPOFOL;  Surgeon: Hulen Luster, MD;  Location: [facility] ENDOSCOPY;  Service: Gastroenterology;  Laterality: N/A;  . ESOPHAGOGASTRODUODENOSCOPY N/A 11/15/2014   Procedure: ESOPHAGOGASTRODUODENOSCOPY (EGD);  Surgeon: Hulen Luster, MD;  Location: [facility] ENDOSCOPY;  Service: Gastroenterology;  Laterality: N/A;  . UPPER GI ENDOSCOPY  2014   [facility] in [place] History  . Not on file.   Social History Main Topics  . Smoking status: Current Every Day Smoker    Packs/day: 1.00    Years: 30.00    Types: Cigarettes  . Smokeless tobacco: Never Used  . Alcohol use No  . Drug use: No  . Sexual activity: Not on file

## 2017-01-06 ENCOUNTER — Ambulatory Visit (INDEPENDENT_AMBULATORY_CARE_PROVIDER_SITE_OTHER): Payer: Medicare Other | Admitting: Physical Medicine and Rehabilitation

## 2017-01-06 ENCOUNTER — Ambulatory Visit (INDEPENDENT_AMBULATORY_CARE_PROVIDER_SITE_OTHER): Payer: Medicare Other

## 2017-01-06 ENCOUNTER — Encounter (INDEPENDENT_AMBULATORY_CARE_PROVIDER_SITE_OTHER): Payer: Self-pay | Admitting: Physical Medicine and Rehabilitation

## 2017-01-06 VITALS — BP 144/77 | HR 57

## 2017-01-06 DIAGNOSIS — M5416 Radiculopathy, lumbar region: Secondary | ICD-10-CM | POA: Diagnosis not present

## 2017-01-06 DIAGNOSIS — M25551 Pain in right hip: Secondary | ICD-10-CM

## 2017-01-06 MED ORDER — LIDOCAINE HCL (PF) 1 % IJ SOLN
2.0000 mL | Freq: Once | INTRAMUSCULAR | Status: AC
  Administered 2017-01-06: 2 mL

## 2017-01-06 MED ORDER — BETAMETHASONE SOD PHOS & ACET 6 (3-3) MG/ML IJ SUSP
12.0000 mg | Freq: Once | INTRAMUSCULAR | Status: AC
  Administered 2017-01-06: 12 mg

## 2017-01-06 NOTE — Progress Notes (Deleted)
Increased right side low back and right hip pain for the last several months. Into groin and down mid thigh at times. Pain is worse with lifting leg up. Difficulty getting moving first thing in the morning and says once she starts moving she gets some better.

## 2017-01-06 NOTE — Patient Instructions (Signed)
[facility] Discharge Instructions  *At any time if you have questions or concerns they can be answered by calling [phone]  All Patients: . You may experience an increase in your symptoms for the first 2 days (it can take 2 days to 2 weeks for the steroid/cortisone to have its maximal effect). . You may use ice to the site for the first 24 hours; 20 minutes on and 20 minutes off and may use heat after that time. . You may resume and continue your current pain medications. If you need a refill please contact the prescribing physician. . You may resume her regular medications if any were stopped for the procedure. . You may shower but no swimming, tub bath or Jacuzzi for 24 hours. . Please remove bandage after 4 hours. . You may resume light activities as tolerated. . If you had Spine Injection, you should not drive for the next 3 hours due to anesthetics used in the procedure. Please have someone drive for you.  *If you have had sedation, Valium, Xanax, or lorazepam: Do not drive or use public transportation for 24 hours, do not operating hazardous machinery or make important personal/business decisions for 24 hours.  POSSIBLE STEROID SIDE EFFECTS: If experienced these should only last for a short period. Change in menstrual flow  Edema in (swelling)  Increased appetite Skin flushing (redness)  Skin rash/acne  Thrush (oral) Vaginitis    Increased sweating  Depression Increased blood glucose levels Cramping and leg/calf  Euphoria (feeling happy)  POSSIBLE PROCEDURE SIDE EFFECTS: Please call our office if concerned. Increased pain Increased numbness/tingling  Headache Nausea/vomiting Hematoma (bruising/bleeding) Edema (swelling at the site) Weakness  Infection (red/drainage at site) Fever greater than 100.5F  *In the event of a headache after epidural steroid injection: Drink plenty of fluids, especially water and try to lay flat when possible. If the headache does  not get better after a few days or as always if concerned please call the office.  

## 2017-01-11 NOTE — Procedures (Signed)
Shannon Li is a 66 year old female that we saw in the past and completed right L4 transforaminal injection with good relief of her right hip and leg pain. She had MRI imaging at that time showing foraminal and extraforaminal L4 disc herniation. She's recently follow-up with Dr. Lorin Mercy with increasing pain over the last few months. We will repeat the injection today and she'll follow-up with Dr. Lorin Mercy.The injection  will be diagnostic and hopefully therapeutic. The patient has failed conservative care including time, medications and activity modification.  Lumbosacral Transforaminal Epidural Steroid Injection - Sub-Pedicular Approach with Fluoroscopic Guidance  Patient: Shannon Li      Date of Birth: 1951-01-17 MRN: [id number] PCP: Lavera Guise, MD      Visit Date: 01/06/2017   Universal Protocol:    Date/Time: 01/06/2017  Consent Given By: the patient  Position: PRONE  Additional Comments: Vital signs were monitored before and after the procedure. Patient was prepped and draped in the usual sterile fashion. The correct patient, procedure, and site was verified.   Injection Procedure Details:  Procedure Site One Meds Administered:  Meds ordered this encounter  Medications  . betamethasone acetate-betamethasone sodium phosphate (CELESTONE) injection 12 mg  . lidocaine (PF) (XYLOCAINE) 1 % injection 2 mL    Laterality: Right  Location/Site:  L4-L5  Needle size: 22 G  Needle type: Spinal  Needle Placement: Transforaminal  Findings:  -Contrast Used: 1 mL iohexol 180 mg iodine/mL   -Comments: Excellent flow of contrast along the nerve and into the epidural space.  Procedure Details: After squaring off the end-plates to get a true AP view, the C-arm was positioned so that an oblique view of the foramen as noted above was visualized. The target area is just inferior to the "nose of the scotty dog" or sub pedicular. The soft tissues overlying this structure were  infiltrated with 2-3 ml. of 1% Lidocaine without Epinephrine.  The spinal needle was inserted toward the target using a "trajectory" view along the fluoroscope beam.  Under AP and lateral visualization, the needle was advanced so it did not puncture dura and was located close the 6 O'Clock position of the pedical in AP tracterory. Biplanar projections were used to confirm position. Aspiration was confirmed to be negative for CSF and/or blood. A 1-2 ml. volume of Isovue-250 was injected and flow of contrast was noted at each level. Radiographs were obtained for documentation purposes.   After attaining the desired flow of contrast documented above, a 0.5 to 1.0 ml test dose of 0.25% Marcaine was injected into each respective transforaminal space.  The patient was observed for 90 seconds post injection.  After no sensory deficits were reported, and normal lower extremity motor function was noted,   the above injectate was administered so that equal amounts of the injectate were placed at each foramen (level) into the transforaminal epidural space.   Additional Comments:  The patient tolerated the procedure well Dressing: Band-Aid    Post-procedure details: Patient was observed during the procedure. Post-procedure instructions were reviewed.  Patient left the clinic in stable condition.

## 2017-01-27 DIAGNOSIS — L905 Scar conditions and fibrosis of skin: Secondary | ICD-10-CM | POA: Diagnosis not present

## 2017-01-27 DIAGNOSIS — D0461 Carcinoma in situ of skin of right upper limb, including shoulder: Secondary | ICD-10-CM | POA: Diagnosis not present

## 2017-01-27 DIAGNOSIS — C44622 Squamous cell carcinoma of skin of right upper limb, including shoulder: Secondary | ICD-10-CM | POA: Diagnosis not present

## 2017-02-24 ENCOUNTER — Other Ambulatory Visit: Payer: Self-pay | Admitting: Internal Medicine

## 2017-02-24 DIAGNOSIS — Z1231 Encounter for screening mammogram for malignant neoplasm of breast: Secondary | ICD-10-CM

## 2017-03-24 ENCOUNTER — Ambulatory Visit
Admission: RE | Admit: 2017-03-24 | Discharge: 2017-03-24 | Disposition: A | Discharge status: Home / Self Care | Payer: Medicare Other | Source: Ambulatory Visit | Attending: Internal Medicine | Admitting: Internal Medicine

## 2017-03-24 DIAGNOSIS — Z1231 Encounter for screening mammogram for malignant neoplasm of breast: Secondary | ICD-10-CM | POA: Diagnosis present

## 2017-04-09 ENCOUNTER — Ambulatory Visit (INDEPENDENT_AMBULATORY_CARE_PROVIDER_SITE_OTHER): Payer: Medicare Other

## 2017-04-09 DIAGNOSIS — Z23 Encounter for immunization: Secondary | ICD-10-CM

## 2017-05-24 ENCOUNTER — Encounter: Payer: Self-pay | Admitting: Nurse Practitioner

## 2017-05-24 ENCOUNTER — Ambulatory Visit (INDEPENDENT_AMBULATORY_CARE_PROVIDER_SITE_OTHER): Payer: Medicare Other | Admitting: Nurse Practitioner

## 2017-05-24 VITALS — BP 122/70 | HR 85 | Temp 98.0°F | Resp 16 | Ht 68.0 in | Wt 195.0 lb

## 2017-05-24 DIAGNOSIS — J069 Acute upper respiratory infection, unspecified: Secondary | ICD-10-CM | POA: Diagnosis not present

## 2017-05-24 DIAGNOSIS — I10 Essential (primary) hypertension: Secondary | ICD-10-CM | POA: Diagnosis not present

## 2017-05-24 MED ORDER — LEVOFLOXACIN 500 MG PO TABS: 500.0000 mg | ORAL_TABLET | Freq: Every day | ORAL | 0 refills | Status: DC

## 2017-05-24 NOTE — Progress Notes (Signed)
[facility] [place], [place] [address]  Internal MEDICINE  Office Visit Note  Patient Name: Shannon Li  [id number]  [id number]  Date of Service: 06/02/2017  Chief Complaint  Patient presents with  . Sore Throat    going on few days   . Sinusitis  . Cough  . Ear Pain  . Headache     Sore Throat   This is a new problem. The current episode started in the past 7 days. The problem has been gradually worsening. There has been no fever. The pain is at a severity of 4/10. The pain is moderate. Associated symptoms include congestion, coughing, ear pain and headaches. Pertinent negatives include no diarrhea or vomiting. She has tried NSAIDs for the symptoms. The treatment provided mild relief.   Pt is here for a sick visit.     Current Medication:  Outpatient Encounter Medications as of 05/24/2017  Medication Sig  . amLODipine (NORVASC) 2.5 MG tablet Take 2.5 mg by mouth daily.  . bisoprolol-hydrochlorothiazide (ZIAC) 2.5-6.25 MG per tablet Take 1 tablet by mouth daily.  Marland Kitchen escitalopram (LEXAPRO) 20 MG tablet Take by mouth.  . hydrochlorothiazide (HYDRODIURIL) 12.5 MG tablet TAKE 1 TABLET(S) BY MOUTH DAILY IN THE MORNING  . pantoprazole (PROTONIX) 40 MG tablet Take 40 mg by mouth daily.  . [DISCONTINUED] dicyclomine (BENTYL) 10 MG capsule Take 10 mg by mouth 4 (four) times daily -  before meals and at bedtime.  . [DISCONTINUED] escitalopram (LEXAPRO) 10 MG tablet Take 15 mg by mouth daily.  . [DISCONTINUED] Eszopiclone (ESZOPICLONE) 3 MG TABS Take 3 mg by mouth at bedtime. Take immediately before bedtime  . [DISCONTINUED] levofloxacin (LEVAQUIN) 500 MG tablet Take 1 tablet (500 mg total) by mouth daily.  . [DISCONTINUED] pravastatin (PRAVACHOL) 20 MG tablet Take 20 mg by mouth at bedtime.  . [DISCONTINUED] sulfamethoxazole-trimethoprim (BACTRIM DS,SEPTRA DS) 800-160 MG per tablet Take 1 tablet by mouth 2 (two) times daily. (Patient not taking: Reported  on 12/22/2016)   No facility-administered encounter medications on file as of 05/24/2017.       Medical History: Past Medical History:  Diagnosis Date  . Anxiety   . Arthritis   . Atrophic kidney   . Chronic constipation   . Colon polyp 2013  . COPD (chronic obstructive pulmonary disease) ([place])   . Depression   . Diverticulosis   . Esophageal stricture   . Esophagitis   . Fibrocystic breast disease   . GERD (gastroesophageal reflux disease)   . History of hiatal hernia   . Hypertension   . IBS (irritable bowel syndrome)   . Menopause   . Ovarian cyst   . Plantar fasciitis   . Postmenopausal   . Renal artery occlusion ([place])   . Renal insufficiency    Right kidney is small and not functioning.     Today's Vitals   05/24/17 1517 05/24/17 1556  BP: (!) 148/98 122/70  Pulse: 85   Resp: 16   Temp: 98 F (36.7 C)   SpO2: 97%   Weight: 195 lb (88.5 kg)   Height: 5\' 8"  (1.727 m)     Review of Systems  Constitutional: Positive for chills and fatigue. Negative for fever.  HENT: Positive for congestion, ear pain, postnasal drip, rhinorrhea, sinus pain, sore throat and voice change.   Eyes: Negative.   Respiratory: Positive for cough. Negative for wheezing.   Cardiovascular: Negative.   Gastrointestinal: Negative for constipation, diarrhea, nausea and vomiting.  Endocrine:  Negative.   Musculoskeletal: Negative.   Skin: Negative.   Allergic/Immunologic: Negative.   Neurological: Positive for headaches.  Psychiatric/Behavioral: Negative.     Physical Exam  Constitutional: She is oriented to person, place, and time. She appears well-developed and well-nourished. No distress.  HENT:  Head: Normocephalic and atraumatic.  Right Ear: Tympanic membrane is erythematous and bulging.  Left Ear: Tympanic membrane is erythematous and bulging.  Nose: Rhinorrhea present. Right sinus exhibits maxillary sinus tenderness and frontal sinus tenderness. Left sinus exhibits maxillary  sinus tenderness and frontal sinus tenderness.  Mouth/Throat: Posterior oropharyngeal erythema present. No oropharyngeal exudate.  Eyes: EOM are normal. Pupils are equal, round, and reactive to light.  Neck: Normal range of motion. Neck supple. No JVD present. No tracheal deviation present. No thyromegaly present.  Cardiovascular: Normal rate, regular rhythm and normal heart sounds. Exam reveals no gallop and no friction rub.  No murmur heard. Pulmonary/Chest: Effort normal and breath sounds normal. No respiratory distress. She has no wheezes. She has no rales. She exhibits no tenderness.  Abdominal: Soft. Bowel sounds are normal. There is no tenderness.  Musculoskeletal: Normal range of motion.  Lymphadenopathy:    She has cervical adenopathy.  Neurological: She is alert and oriented to person, place, and time. No cranial nerve deficit.  Skin: Skin is warm and dry. She is not diaphoretic.  Psychiatric: She has a normal mood and affect. Her behavior is normal. Judgment and thought content normal.  Nursing note and vitals reviewed.   Assessment/Plan:  1. Acute upper respiratory infection Patient started on levofloxacin 500mg  daily. She should finish entire round of antibiotics. Use OTC medications as needed and as prescribed for symptom management.  Finish prednisone taper.   2. Essential hypertension Generally stable. Continue bp medication as prescribed.  General Counseling: dierra riesgo understanding of the findings of todays visit and agrees with plan of treatment. I have discussed any further diagnostic evaluation that may be needed or ordered today. We also reviewed her medications today. she has been encouraged to call the office with any questions or concerns that should arise related to todays visit.   This patient was seen by Leretha Pol, FNP- C in Collaboration with Dr Lavera Guise as a part of collaborative care agreement     Meds ordered this encounter   Medications  . DISCONTD: levofloxacin (LEVAQUIN) 500 MG tablet    Sig: Take 1 tablet (500 mg total) by mouth daily.    Dispense:  10 tablet    Refill:  0    Order Specific Question:   Supervising Provider    Answer:   Lavera Guise [[id number]]    Time spent: 15 Minutes

## 2017-05-31 ENCOUNTER — Telehealth: Payer: Self-pay

## 2017-05-31 ENCOUNTER — Other Ambulatory Visit: Payer: Self-pay | Admitting: Nurse Practitioner

## 2017-05-31 DIAGNOSIS — C44729 Squamous cell carcinoma of skin of left lower limb, including hip: Secondary | ICD-10-CM | POA: Diagnosis not present

## 2017-05-31 DIAGNOSIS — D2261 Melanocytic nevi of right upper limb, including shoulder: Secondary | ICD-10-CM | POA: Diagnosis not present

## 2017-05-31 DIAGNOSIS — Z85828 Personal history of other malignant neoplasm of skin: Secondary | ICD-10-CM | POA: Diagnosis not present

## 2017-05-31 DIAGNOSIS — D225 Melanocytic nevi of trunk: Secondary | ICD-10-CM | POA: Diagnosis not present

## 2017-05-31 DIAGNOSIS — J069 Acute upper respiratory infection, unspecified: Secondary | ICD-10-CM

## 2017-05-31 DIAGNOSIS — D2272 Melanocytic nevi of left lower limb, including hip: Secondary | ICD-10-CM | POA: Diagnosis not present

## 2017-05-31 DIAGNOSIS — D485 Neoplasm of uncertain behavior of skin: Secondary | ICD-10-CM | POA: Diagnosis not present

## 2017-05-31 MED ORDER — LEVOFLOXACIN 500 MG PO TABS: 500.0000 mg | ORAL_TABLET | Freq: Every day | ORAL | 0 refills | Status: DC

## 2017-05-31 MED ORDER — PREDNISONE 10 MG (21) PO TBPK: ORAL_TABLET | ORAL | 0 refills | Status: DC

## 2017-05-31 NOTE — Telephone Encounter (Signed)
Let pt know this.

## 2017-05-31 NOTE — Telephone Encounter (Signed)
-----   Message from Ronnell Freshwater, NP sent at 05/31/2017  3:34 PM EST ----- Regarding: RE: sick rx Please let her know I will redo levofloxacin 500mg  dosing daily for 110 days. I added prednisone 6 day taper. Take as directed. Both sent to her pharmacy. Thanks  ----- Message ----- From: Waynard Edwards Sent: 05/31/2017   2:53 PM To: Ronnell Freshwater, NP Subject: sick rx                                        Pt needs something else for her sinus infection that could possibly lead to bronchitis.

## 2017-06-02 DIAGNOSIS — I10 Essential (primary) hypertension: Secondary | ICD-10-CM | POA: Insufficient documentation

## 2017-06-11 ENCOUNTER — Other Ambulatory Visit: Payer: Self-pay | Admitting: Internal Medicine

## 2017-06-22 ENCOUNTER — Other Ambulatory Visit: Payer: Self-pay | Admitting: Nurse Practitioner

## 2017-06-22 ENCOUNTER — Other Ambulatory Visit: Payer: Self-pay

## 2017-06-22 DIAGNOSIS — J069 Acute upper respiratory infection, unspecified: Secondary | ICD-10-CM

## 2017-06-22 MED ORDER — PREDNISONE 10 MG PO TABS: ORAL_TABLET | ORAL | 0 refills | Status: DC

## 2017-06-22 MED ORDER — LEVOFLOXACIN 500 MG PO TABS: 500.0000 mg | ORAL_TABLET | Freq: Every day | ORAL | 0 refills | Status: DC

## 2017-06-22 MED ORDER — PREDNISONE 10 MG (21) PO TBPK: ORAL_TABLET | ORAL | 0 refills | Status: DC

## 2017-06-22 NOTE — Progress Notes (Signed)
Will do round levofloxacin 500mg  daily and prednisone taper for 6 days. Both sent to her pharmacy.

## 2017-06-23 ENCOUNTER — Telehealth: Payer: Self-pay

## 2017-06-23 NOTE — Telephone Encounter (Signed)
Left vm that rx's are at pharmacy to pick up.  dbs

## 2017-06-23 NOTE — Telephone Encounter (Signed)
-----   Message from Ronnell Freshwater, NP sent at 06/22/2017  8:17 AM EST ----- Regarding: RE: pt request abx Contact: [phone] Will do round levofloxacin 500mg  daily and prednisone taper for 6 days. Both sent to her pharmacy.   ----- Message ----- From: Edd Arbour, CMA Sent: 06/21/2017   4:08 PM To: Ronnell Freshwater, NP Subject: pt request abx                                 Pt left vm asking for a abx and pred taper for sinus infection to be sent to pharmacy if possible.  dbs

## 2017-07-02 DIAGNOSIS — C44729 Squamous cell carcinoma of skin of left lower limb, including hip: Secondary | ICD-10-CM | POA: Diagnosis not present

## 2017-07-02 DIAGNOSIS — L905 Scar conditions and fibrosis of skin: Secondary | ICD-10-CM | POA: Diagnosis not present

## 2017-07-29 ENCOUNTER — Other Ambulatory Visit: Payer: Self-pay | Admitting: Nurse Practitioner

## 2017-07-29 DIAGNOSIS — E782 Mixed hyperlipidemia: Secondary | ICD-10-CM | POA: Diagnosis not present

## 2017-07-29 DIAGNOSIS — Z0001 Encounter for general adult medical examination with abnormal findings: Secondary | ICD-10-CM | POA: Diagnosis not present

## 2017-07-29 DIAGNOSIS — I10 Essential (primary) hypertension: Secondary | ICD-10-CM | POA: Diagnosis not present

## 2017-07-29 DIAGNOSIS — E559 Vitamin D deficiency, unspecified: Secondary | ICD-10-CM | POA: Diagnosis not present

## 2017-07-30 LAB — COMPREHENSIVE METABOLIC PANEL WITH GFR
ALT: 15 [IU]/L (ref 0–32)
AST: 24 [IU]/L (ref 0–40)
Albumin/Globulin Ratio: 1.6 (ref 1.2–2.2)
Albumin: 4 g/dL (ref 3.6–4.8)
Alkaline Phosphatase: 75 [IU]/L (ref 39–117)
BUN/Creatinine Ratio: 15 (ref 12–28)
BUN: 19 mg/dL (ref 8–27)
Bilirubin Total: 0.4 mg/dL (ref 0.0–1.2)
CO2: 24 mmol/L (ref 20–29)
Calcium: 9.1 mg/dL (ref 8.7–10.3)
Chloride: 104 mmol/L (ref 96–106)
Creatinine, Ser: 1.3 mg/dL — ABNORMAL HIGH (ref 0.57–1.00)
GFR calc Af Amer: 49 mL/min/{1.73_m2} — ABNORMAL LOW
GFR calc non Af Amer: 42 mL/min/{1.73_m2} — ABNORMAL LOW
Globulin, Total: 2.5 g/dL (ref 1.5–4.5)
Glucose: 90 mg/dL (ref 65–99)
Potassium: 3.9 mmol/L (ref 3.5–5.2)
Sodium: 143 mmol/L (ref 134–144)
Total Protein: 6.5 g/dL (ref 6.0–8.5)

## 2017-07-30 LAB — CBC
Hematocrit: 42.7 % (ref 34.0–46.6)
Hemoglobin: 14.4 g/dL (ref 11.1–15.9)
MCH: 30.2 pg (ref 26.6–33.0)
MCHC: 33.7 g/dL (ref 31.5–35.7)
MCV: 90 fL (ref 79–97)
Platelets: 294 10*3/uL (ref 150–379)
RBC: 4.77 x10E6/uL (ref 3.77–5.28)
RDW: 14.8 % (ref 12.3–15.4)
WBC: 6.5 10*3/uL (ref 3.4–10.8)

## 2017-07-30 LAB — TSH: TSH: 2.07 u[IU]/mL (ref 0.450–4.500)

## 2017-07-30 LAB — LIPID PANEL W/O CHOL/HDL RATIO
Cholesterol, Total: 219 mg/dL — ABNORMAL HIGH (ref 100–199)
HDL: 38 mg/dL — ABNORMAL LOW
LDL Calculated: 150 mg/dL — ABNORMAL HIGH (ref 0–99)
Triglycerides: 153 mg/dL — ABNORMAL HIGH (ref 0–149)
VLDL Cholesterol Cal: 31 mg/dL (ref 5–40)

## 2017-07-30 LAB — VITAMIN D 25 HYDROXY (VIT D DEFICIENCY, FRACTURES): Vit D, 25-Hydroxy: 26.5 ng/mL — ABNORMAL LOW (ref 30.0–100.0)

## 2017-07-30 LAB — T4, FREE: Free T4: 1.17 ng/dL (ref 0.82–1.77)

## 2017-08-24 ENCOUNTER — Ambulatory Visit (INDEPENDENT_AMBULATORY_CARE_PROVIDER_SITE_OTHER): Payer: Medicare Other | Admitting: Nurse Practitioner

## 2017-08-24 ENCOUNTER — Encounter: Payer: Self-pay | Admitting: Nurse Practitioner

## 2017-08-24 VITALS — BP 148/90 | HR 67 | Resp 16 | Ht 68.0 in | Wt 192.0 lb

## 2017-08-24 DIAGNOSIS — R300 Dysuria: Secondary | ICD-10-CM | POA: Diagnosis not present

## 2017-08-24 DIAGNOSIS — I10 Essential (primary) hypertension: Secondary | ICD-10-CM

## 2017-08-24 DIAGNOSIS — R0602 Shortness of breath: Secondary | ICD-10-CM

## 2017-08-24 DIAGNOSIS — R0600 Dyspnea, unspecified: Secondary | ICD-10-CM | POA: Insufficient documentation

## 2017-08-24 DIAGNOSIS — Z0001 Encounter for general adult medical examination with abnormal findings: Secondary | ICD-10-CM | POA: Diagnosis not present

## 2017-08-24 DIAGNOSIS — Z23 Encounter for immunization: Secondary | ICD-10-CM | POA: Insufficient documentation

## 2017-08-24 DIAGNOSIS — R0609 Other forms of dyspnea: Secondary | ICD-10-CM | POA: Insufficient documentation

## 2017-08-24 MED ORDER — ALBUTEROL SULFATE HFA 108 (90 BASE) MCG/ACT IN AERS: 2.0000 | INHALATION_SPRAY | RESPIRATORY_TRACT | 5 refills | Status: DC | PRN

## 2017-08-24 NOTE — Addendum Note (Signed)
Addended by: Leretha Pol on: 08/24/2017 09:52 AM   Modules accepted: Orders

## 2017-08-24 NOTE — Addendum Note (Signed)
Addended by: Leretha Pol on: 08/24/2017 10:14 AM   Modules accepted: Orders

## 2017-08-24 NOTE — Progress Notes (Addendum)
[facility] [place], [place] [address]  Internal MEDICINE  Office Visit Note  Patient Name: Shannon Li  [id number]  [id number]  Date of Service: 08/24/2017   Pt is here for routine health maintenance examination   Chief Complaint  Patient presents with  . Annual Exam  . Back Pain     The patient is reporting worsening back pain. Does have two herniated discs in her back. Has seen orthopedics in the past. Has had corticosteroid injections into the spine which did help. Trying to hold off on another injection for as long as possible.  Treated twice for sinus infection with SOB and wheezing. Took six to eight weeks to really get over this illness completely. Still has episodes when she gets short winded. Has used up the rescue inhaler she was prescribed and has not been using this any longer.    Current Medication: Outpatient Encounter Medications as of 08/24/2017  Medication Sig  . albuterol (PROVENTIL HFA) 108 (90 Base) MCG/ACT inhaler Inhale 2 puffs into the lungs every 4 (four) hours as needed for wheezing or shortness of breath.  . [DISCONTINUED] albuterol (PROVENTIL HFA) 108 (90 Base) MCG/ACT inhaler Inhale into the lungs.  Marland Kitchen amLODipine (NORVASC) 2.5 MG tablet Take 2.5 mg by mouth daily.  . bisoprolol-hydrochlorothiazide (ZIAC) 2.5-6.25 MG tablet Take 2 tablets by mouth every morning for blood pressure  . escitalopram (LEXAPRO) 20 MG tablet Take by mouth.  . hydrochlorothiazide (HYDRODIURIL) 12.5 MG tablet TAKE 1 TABLET(S) BY MOUTH DAILY IN THE MORNING  . levofloxacin (LEVAQUIN) 500 MG tablet Take 1 tablet (500 mg total) by mouth daily.  . [DISCONTINUED] pantoprazole (PROTONIX) 40 MG tablet Take 1 tablet by mouth twice a day (dose increase) (Patient not taking: Reported on 08/24/2017)  . [DISCONTINUED] predniSONE (DELTASONE) 10 MG tablet Use as directed 6,5,4,3,2,1 for 6 days (Patient not taking: Reported on 08/24/2017)   No facility-administered  encounter medications on file as of 08/24/2017.     Surgical History: Past Surgical History:  Procedure Laterality Date  . ABSCESS DRAINAGE  11/17/13   buttock right  . BREAST BIOPSY Right    neg  . CHOLECYSTECTOMY  2008  . COLONOSCOPY  2014   [facility] [place]  . COLONOSCOPY WITH PROPOFOL N/A 08/26/2015   Procedure: COLONOSCOPY WITH PROPOFOL;  Surgeon: Hulen Luster, MD;  Location: [facility] ENDOSCOPY;  Service: Gastroenterology;  Laterality: N/A;  . ESOPHAGOGASTRODUODENOSCOPY N/A 11/15/2014   Procedure: ESOPHAGOGASTRODUODENOSCOPY (EGD);  Surgeon: Hulen Luster, MD;  Location: [facility] ENDOSCOPY;  Service: Gastroenterology;  Laterality: N/A;  . UPPER GI ENDOSCOPY  2014   [facility] [place] History: Past Medical History:  Diagnosis Date  . Anxiety   . Arthritis   . Atrophic kidney   . Chronic constipation   . Colon polyp 2013  . COPD (chronic obstructive pulmonary disease) ([place])   . Depression   . Diverticulosis   . Esophageal stricture   . Esophagitis   . Fibrocystic breast disease   . GERD (gastroesophageal reflux disease)   . History of hiatal hernia   . Hypertension   . IBS (irritable bowel syndrome)   . Menopause   . Ovarian cyst   . Plantar fasciitis   . Postmenopausal   . Renal artery occlusion ([place])   . Renal insufficiency    Right kidney is small and not functioning.    Family History: Family History  Problem Relation Age of Onset  . Heart  disease Mother   . Heart attack Mother   . Diabetes Mother   . Alzheimer's disease Father   . Breast cancer Sister 81  . Lung cancer Brother   . Brain cancer Brother   . Lung cancer Sister   . Bladder Cancer Sister   . Lung cancer Brother   . Leukemia Brother   . Diabetes Brother       Review of Systems  Constitutional: Negative for chills, fatigue and fever.  HENT: Negative for congestion, ear pain, postnasal drip, rhinorrhea, sinus pain, sore throat and voice change.   Eyes: Negative.    Respiratory: Positive for shortness of breath. Negative for cough and wheezing.   Cardiovascular: Positive for leg swelling.  Gastrointestinal: Negative for constipation, diarrhea, nausea and vomiting.  Endocrine: Negative for cold intolerance, heat intolerance, polydipsia, polyphagia and polyuria.  Genitourinary: Negative for frequency and urgency.  Musculoskeletal: Positive for back pain.  Skin:       Recently had squamous cell carcinoma removed from left lower leg .  Allergic/Immunologic: Positive for environmental allergies.  Neurological: Positive for headaches.  Hematological: Negative for adenopathy.  Psychiatric/Behavioral: The patient is not nervous/anxious.     Today's Vitals   08/24/17 0913  BP: (!) 148/90  Pulse: 67  Resp: 16  SpO2: 97%  Weight: 192 lb (87.1 kg)  Height: 5\' 8"  (1.727 m)    Physical Exam  Constitutional: She is oriented to person, place, and time. She appears well-developed and well-nourished. No distress.  HENT:  Head: Normocephalic and atraumatic.  Mouth/Throat: Oropharynx is clear and moist. No oropharyngeal exudate.  Eyes: Pupils are equal, round, and reactive to light. EOM are normal.  Neck: Normal range of motion. Neck supple. No JVD present. Carotid bruit is not present. No tracheal deviation present. No thyromegaly present.  Cardiovascular: Normal rate, regular rhythm, normal heart sounds and intact distal pulses. Exam reveals no gallop and no friction rub.  No murmur heard. Pulmonary/Chest: Effort normal and breath sounds normal. No respiratory distress. She has no wheezes. She has no rales. She exhibits no tenderness. Right breast exhibits no inverted nipple, no mass, no nipple discharge, no skin change and no tenderness. Left breast exhibits no inverted nipple, no mass, no nipple discharge, no skin change and no tenderness. No breast tenderness.  Abdominal: Soft. Bowel sounds are normal. There is no tenderness.  Musculoskeletal:  Moderate  lower and mid back pain. This is made worse with bending and twisting at the waist. It is also made worse with exertion. No visible abnormalities or deformities present.   Lymphadenopathy:    She has no cervical adenopathy.  Neurological: She is alert and oriented to person, place, and time. No cranial nerve deficit.  Skin: Skin is warm and dry. Capillary refill takes 2 to 3 seconds. She is not diaphoretic.  Psychiatric: She has a normal mood and affect. Her behavior is normal. Judgment and thought content normal.  Nursing note and vitals reviewed.    LABS: Recent Results (from the past 2160 hour(s))  Comprehensive metabolic panel     Status: Abnormal   Collection Time: 07/29/17  9:08 AM  Result Value Ref Range   Glucose 90 65 - 99 mg/dL   BUN 19 8 - 27 mg/dL   Creatinine, Ser 1.30 (H) 0.57 - 1.00 mg/dL   GFR calc non Af Amer 42 (L) >59 mL/min/1.73   GFR calc Af Amer 49 (L) >59 mL/min/1.73   BUN/Creatinine Ratio 15 12 - 28   Sodium  143 134 - 144 mmol/L   Potassium 3.9 3.5 - 5.2 mmol/L   Chloride 104 96 - 106 mmol/L   CO2 24 20 - 29 mmol/L   Calcium 9.1 8.7 - 10.3 mg/dL   Total Protein 6.5 6.0 - 8.5 g/dL   Albumin 4.0 3.6 - 4.8 g/dL   Globulin, Total 2.5 1.5 - 4.5 g/dL   Albumin/Globulin Ratio 1.6 1.2 - 2.2   Bilirubin Total 0.4 0.0 - 1.2 mg/dL   Alkaline Phosphatase 75 39 - 117 IU/L   AST 24 0 - 40 IU/L   ALT 15 0 - 32 IU/L  CBC     Status: None   Collection Time: 07/29/17  9:08 AM  Result Value Ref Range   WBC 6.5 3.4 - 10.8 x10E3/uL   RBC 4.77 3.77 - 5.28 x10E6/uL   Hemoglobin 14.4 11.1 - 15.9 g/dL   Hematocrit 42.7 34.0 - 46.6 %   MCV 90 79 - 97 fL   MCH 30.2 26.6 - 33.0 pg   MCHC 33.7 31.5 - 35.7 g/dL   RDW 14.8 12.3 - 15.4 %   Platelets 294 150 - 379 x10E3/uL  Lipid Panel w/o Chol/HDL Ratio     Status: Abnormal   Collection Time: 07/29/17  9:08 AM  Result Value Ref Range   Cholesterol, Total 219 (H) 100 - 199 mg/dL   Triglycerides 153 (H) 0 - 149 mg/dL   HDL 38  (L) >39 mg/dL   VLDL Cholesterol Cal 31 5 - 40 mg/dL   LDL Calculated 150 (H) 0 - 99 mg/dL  T4, free     Status: None   Collection Time: 07/29/17  9:08 AM  Result Value Ref Range   Free T4 1.17 0.82 - 1.77 ng/dL  TSH     Status: None   Collection Time: 07/29/17  9:08 AM  Result Value Ref Range   TSH 2.070 0.450 - 4.500 uIU/mL  VITAMIN D 25 Hydroxy (Vit-D Deficiency, Fractures)     Status: Abnormal   Collection Time: 07/29/17  9:08 AM  Result Value Ref Range   Vit D, 25-Hydroxy 26.5 (L) 30.0 - 100.0 ng/mL    Comment: Vitamin D deficiency has been defined by the Institute of Medicine and an Endocrine Society practice guideline as a level of serum 25-OH vitamin D less than 20 ng/mL (1,2). The Endocrine Society went on to further define vitamin D insufficiency as a level between 21 and 29 ng/mL (2). 1. IOM (Institute of Medicine). 2010. Dietary reference    intakes for calcium and D. [place]: The    [employer]. 2. Holick MF, Binkley [place], Bischoff-Ferrari HA, et al.    Evaluation, treatment, and prevention of vitamin D    deficiency: an Endocrine Society clinical practice    guideline. JCEM. 2011 Jul; 96(7):1911-30.      Assessment/Plan:  1. Encounter for general adult medical examination with abnormal findings Annual health maintenance exam today  2. Shortness of breath Use rescue inhaler as needed and as prescribed.  - albuterol (PROVENTIL HFA) 108 (90 Base) MCG/ACT inhaler; Inhale 2 puffs into the lungs every 4 (four) hours as needed for wheezing or shortness of breath.  Dispense: 1 Inhaler; Refill: 5  3. Essential hypertension Stable. Continue bisoprolol/HXTZ as prescribed. May use extra dose HCTZ 12.5mg  as needed for swelling in feet.   4. Dysuria - Urinalysis, Routine w reflex microscopic  5. Need for vaccination against Streptococcus pneumoniae using pneumococcal conjugate vaccine 13 - Pneumococcal conjugate vaccine  13-valent IM   General  Counseling: taniyah ballow understanding of the findings of todays visit and agrees with plan of treatment. I have discussed any further diagnostic evaluation that may be needed or ordered today. We also reviewed her medications today. she has been encouraged to call the office with any questions or concerns that should arise related to todays visit.      Orders Placed This Encounter  Procedures  . Pneumococcal conjugate vaccine 13-valent IM  . Urinalysis, Routine w reflex microscopic    Meds ordered this encounter  Medications  . albuterol (PROVENTIL HFA) 108 (90 Base) MCG/ACT inhaler    Sig: Inhale 2 puffs into the lungs every 4 (four) hours as needed for wheezing or shortness of breath.    Dispense:  1 Inhaler    Refill:  5    Order Specific Question:   Supervising Provider    Answer:   Lavera Guise [[id number]]    Time spent: [place], MD  Internal Medicine

## 2017-08-24 NOTE — Addendum Note (Signed)
Addended by: Leretha Pol on: 08/24/2017 01:54 PM   Modules accepted: Level of Service

## 2017-08-25 LAB — URINALYSIS, ROUTINE W REFLEX MICROSCOPIC
Bilirubin, UA: NEGATIVE
Glucose, UA: NEGATIVE
Leukocytes, UA: NEGATIVE
Nitrite, UA: NEGATIVE
RBC, UA: NEGATIVE
Specific Gravity, UA: 1.021 (ref 1.005–1.030)
Urobilinogen, Ur: 0.2 mg/dL (ref 0.2–1.0)
pH, UA: 5.5 (ref 5.0–7.5)

## 2017-08-25 LAB — MICROSCOPIC EXAMINATION

## 2017-09-01 ENCOUNTER — Other Ambulatory Visit: Payer: Self-pay

## 2017-09-01 ENCOUNTER — Telehealth: Payer: Self-pay

## 2017-09-01 MED ORDER — BETAMETHASONE DIPROPIONATE AUG 0.05 % EX GEL: Freq: Two times a day (BID) | CUTANEOUS | 0 refills | Status: DC

## 2017-09-01 NOTE — Telephone Encounter (Signed)
rx was sent in and pt was notified.

## 2017-09-02 ENCOUNTER — Ambulatory Visit (INDEPENDENT_AMBULATORY_CARE_PROVIDER_SITE_OTHER): Payer: Medicare Other | Admitting: Internal Medicine

## 2017-09-02 ENCOUNTER — Encounter: Payer: Self-pay | Admitting: Internal Medicine

## 2017-09-02 VITALS — BP 144/84 | HR 55 | Temp 97.2°F | Resp 16 | Ht 68.0 in | Wt 193.0 lb

## 2017-09-02 DIAGNOSIS — I10 Essential (primary) hypertension: Secondary | ICD-10-CM | POA: Diagnosis not present

## 2017-09-02 DIAGNOSIS — N182 Chronic kidney disease, stage 2 (mild): Secondary | ICD-10-CM | POA: Diagnosis not present

## 2017-09-02 DIAGNOSIS — L233 Allergic contact dermatitis due to drugs in contact with skin: Secondary | ICD-10-CM | POA: Diagnosis not present

## 2017-09-02 MED ORDER — HYDROCORTISONE 0.5 % EX OINT: 1.0000 "application " | TOPICAL_OINTMENT | Freq: Two times a day (BID) | CUTANEOUS | 0 refills | Status: DC

## 2017-09-02 NOTE — Progress Notes (Signed)
[facility] [place], [place] [address]  Internal MEDICINE  Office Visit Note  Patient Name: Shannon Li  [id number]  [id number]  Date of Service: 09/06/2017  Chief Complaint  Patient presents with  . Rash    left arm rash, had pneumonia shot on 5/7    HPI  Pt is here with concerns of allergic reaction, she started using benadryl yesterday and its improving. No sob or wheezing, itching and rednessat the site of injection    Current Medication: Outpatient Encounter Medications as of 09/02/2017  Medication Sig  . albuterol (PROVENTIL HFA) 108 (90 Base) MCG/ACT inhaler Inhale 2 puffs into the lungs every 4 (four) hours as needed for wheezing or shortness of breath.  Marland Kitchen amLODipine (NORVASC) 2.5 MG tablet Take 2.5 mg by mouth daily.  . betamethasone, augmented, (DIPROLENE) 0.05 % gel Apply topically 2 (two) times daily. Apply on affected area twice a day  . bisoprolol-hydrochlorothiazide (ZIAC) 2.5-6.25 MG tablet Take 2 tablets by mouth every morning for blood pressure  . escitalopram (LEXAPRO) 20 MG tablet Take by mouth.  . hydrocortisone ointment 0.5 % Apply 1 application topically 2 (two) times daily.  Marland Kitchen levofloxacin (LEVAQUIN) 500 MG tablet Take 1 tablet (500 mg total) by mouth daily.  . [DISCONTINUED] hydrochlorothiazide (HYDRODIURIL) 12.5 MG tablet TAKE 1 TABLET(S) BY MOUTH DAILY IN THE MORNING   No facility-administered encounter medications on file as of 09/02/2017.     Surgical History: Past Surgical History:  Procedure Laterality Date  . ABSCESS DRAINAGE  11/17/13   buttock right  . BREAST BIOPSY Right    neg  . CHOLECYSTECTOMY  2008  . COLONOSCOPY  2014   [facility] [place]  . COLONOSCOPY WITH PROPOFOL N/A 08/26/2015   Procedure: COLONOSCOPY WITH PROPOFOL;  Surgeon: Hulen Luster, MD;  Location: [facility] ENDOSCOPY;  Service: Gastroenterology;  Laterality: N/A;  . ESOPHAGOGASTRODUODENOSCOPY N/A 11/15/2014   Procedure:  ESOPHAGOGASTRODUODENOSCOPY (EGD);  Surgeon: Hulen Luster, MD;  Location: [facility] ENDOSCOPY;  Service: Gastroenterology;  Laterality: N/A;  . UPPER GI ENDOSCOPY  2014   [facility] [place] History: Past Medical History:  Diagnosis Date  . Anxiety   . Arthritis   . Atrophic kidney   . Chronic constipation   . Colon polyp 2013  . COPD (chronic obstructive pulmonary disease) ([place])   . Depression   . Diverticulosis   . Esophageal stricture   . Esophagitis   . Fibrocystic breast disease   . GERD (gastroesophageal reflux disease)   . History of hiatal hernia   . Hypertension   . IBS (irritable bowel syndrome)   . Menopause   . Ovarian cyst   . Plantar fasciitis   . Postmenopausal   . Renal artery occlusion ([place])   . Renal insufficiency    Right kidney is small and not functioning.    Family History: Family History  Problem Relation Age of Onset  . Heart disease Mother   . Heart attack Mother   . Diabetes Mother   . Alzheimer's disease Father   . Breast cancer Sister 32  . Lung cancer Brother   . Brain cancer Brother   . Lung cancer Sister   . Bladder Cancer Sister   . Lung cancer Brother   . Leukemia Brother   . Diabetes Brother     Social History   Socioeconomic History  . Marital status: Widowed    Spouse name: Not on file  . Number  of children: Not on file  . Years of education: Not on file  . Highest education level: Not on file  Occupational History  . Not on file  Social Needs  . Financial resource strain: Not on file  . Food insecurity:    Worry: Not on file    Inability: Not on file  . Transportation needs:    Medical: Not on file    Non-medical: Not on file  Tobacco Use  . Smoking status: Current Every Day Smoker    Packs/day: 1.00    Years: 30.00    Pack years: 30.00    Types: Cigarettes  . Smokeless tobacco: Never Used  Substance and Sexual Activity  . Alcohol use: No  . Drug use: No  . Sexual activity: Not on file   Lifestyle  . Physical activity:    Days per week: Not on file    Minutes per session: Not on file  . Stress: Not on file  Relationships  . Social connections:    Talks on phone: Not on file    Gets together: Not on file    Attends religious service: Not on file    Active member of club or organization: Not on file    Attends meetings of clubs or organizations: Not on file    Relationship status: Not on file  . Intimate partner violence:    Fear of current or ex partner: Not on file    Emotionally abused: Not on file    Physically abused: Not on file    Forced sexual activity: Not on file  Other Topics Concern  . Not on file  Social History Narrative  . Not on file      Review of Systems  HENT: Negative for congestion and postnasal drip.   Respiratory: Negative for chest tightness and shortness of breath.   Skin: Positive for rash.   Vital Signs: BP (!) 144/84   Pulse (!) 55   Temp (!) 97.2 F (36.2 C) (Oral)   Resp 16   Ht 5\' 8"  (1.727 m)   Wt 193 lb (87.5 kg)   SpO2 95%   BMI 29.35 kg/m    Physical Exam  Cardiovascular: Normal rate.  Pulmonary/Chest: Effort normal.  Skin: There is erythema.  Slight induration    Assessment/Plan: 1. Allergic contact dermatitis due to drugs in contact with skin - hydrocortisone ointment 0.5 %; Apply 1 application topically 2 (two) times daily.  Dispense: 15 g; Refill: 0  2. Chronic renal disease, stage II - Slight elevation in her numbers, BP is elevated too, Has one functioning kidney  3. Benign hypertension - monitor BP ay home  General Counseling: christina gintz understanding of the findings of todays visit and agrees with plan of treatment. I have discussed any further diagnostic evaluation that may be needed or ordered today. We also reviewed her medications today. she has been encouraged to call the office with any questions or concerns that should arise related to todays visit.    Meds ordered this  encounter  Medications  . hydrocortisone ointment 0.5 %    Sig: Apply 1 application topically 2 (two) times daily.    Dispense:  15 g    Refill:  0    Time spent: Minutes   Dr Lavera Guise Internal medicine

## 2017-10-12 DIAGNOSIS — Z85828 Personal history of other malignant neoplasm of skin: Secondary | ICD-10-CM | POA: Diagnosis not present

## 2017-10-12 DIAGNOSIS — D2262 Melanocytic nevi of left upper limb, including shoulder: Secondary | ICD-10-CM | POA: Diagnosis not present

## 2017-10-12 DIAGNOSIS — D225 Melanocytic nevi of trunk: Secondary | ICD-10-CM | POA: Diagnosis not present

## 2017-10-12 DIAGNOSIS — D2261 Melanocytic nevi of right upper limb, including shoulder: Secondary | ICD-10-CM | POA: Diagnosis not present

## 2017-10-12 DIAGNOSIS — L570 Actinic keratosis: Secondary | ICD-10-CM | POA: Diagnosis not present

## 2017-10-12 DIAGNOSIS — X32XXXA Exposure to sunlight, initial encounter: Secondary | ICD-10-CM | POA: Diagnosis not present

## 2017-10-12 DIAGNOSIS — D2271 Melanocytic nevi of right lower limb, including hip: Secondary | ICD-10-CM | POA: Diagnosis not present

## 2017-10-12 DIAGNOSIS — D2272 Melanocytic nevi of left lower limb, including hip: Secondary | ICD-10-CM | POA: Diagnosis not present

## 2017-10-12 DIAGNOSIS — Z08 Encounter for follow-up examination after completed treatment for malignant neoplasm: Secondary | ICD-10-CM | POA: Diagnosis not present

## 2017-10-22 ENCOUNTER — Other Ambulatory Visit: Payer: Self-pay

## 2017-10-22 MED ORDER — AMLODIPINE BESYLATE 2.5 MG PO TABS: 2.5000 mg | ORAL_TABLET | Freq: Every day | ORAL | 1 refills | Status: DC

## 2017-10-22 MED ORDER — ESCITALOPRAM OXALATE 20 MG PO TABS: ORAL_TABLET | ORAL | 1 refills | Status: DC

## 2017-10-22 MED ORDER — BISOPROLOL-HYDROCHLOROTHIAZIDE 2.5-6.25 MG PO TABS: ORAL_TABLET | ORAL | 1 refills | Status: DC

## 2017-11-29 ENCOUNTER — Ambulatory Visit
Admission: RE | Admit: 2017-11-29 | Discharge: 2017-11-29 | Disposition: A | Discharge status: Home / Self Care | Payer: Medicare Other | Source: Ambulatory Visit | Attending: Adult Health | Admitting: Adult Health

## 2017-11-29 ENCOUNTER — Encounter: Payer: Self-pay | Admitting: Adult Health

## 2017-11-29 ENCOUNTER — Ambulatory Visit (INDEPENDENT_AMBULATORY_CARE_PROVIDER_SITE_OTHER): Payer: Medicare Other | Admitting: Adult Health

## 2017-11-29 VITALS — BP 148/80 | HR 67 | Temp 97.8°F | Resp 16 | Ht 68.0 in | Wt 189.6 lb

## 2017-11-29 DIAGNOSIS — R1084 Generalized abdominal pain: Secondary | ICD-10-CM

## 2017-11-29 DIAGNOSIS — I10 Essential (primary) hypertension: Secondary | ICD-10-CM

## 2017-11-29 DIAGNOSIS — M545 Low back pain, unspecified: Secondary | ICD-10-CM

## 2017-11-29 DIAGNOSIS — K5730 Diverticulosis of large intestine without perforation or abscess without bleeding: Secondary | ICD-10-CM | POA: Insufficient documentation

## 2017-11-29 DIAGNOSIS — I700 Atherosclerosis of aorta: Secondary | ICD-10-CM | POA: Diagnosis not present

## 2017-11-29 DIAGNOSIS — N261 Atrophy of kidney (terminal): Secondary | ICD-10-CM | POA: Diagnosis not present

## 2017-11-29 DIAGNOSIS — R300 Dysuria: Secondary | ICD-10-CM | POA: Diagnosis not present

## 2017-11-29 LAB — POCT URINALYSIS DIPSTICK
Bilirubin, UA: NEGATIVE
Blood, UA: NEGATIVE
Glucose, UA: NEGATIVE
Ketones, UA: NEGATIVE
Leukocytes, UA: NEGATIVE
Nitrite, UA: NEGATIVE
Protein, UA: NEGATIVE
Spec Grav, UA: 1.01
Urobilinogen, UA: 0.2 U/dL
pH, UA: 5

## 2017-11-29 LAB — POCT I-STAT CREATININE: Creatinine, Ser: 1.4 mg/dL — ABNORMAL HIGH (ref 0.44–1.00)

## 2017-11-29 MED ORDER — IOHEXOL 300 MG/ML  SOLN
100.0000 mL | Freq: Once | INTRAMUSCULAR | Status: AC | PRN
  Administered 2017-11-29: 80 mL via INTRAVENOUS

## 2017-11-29 NOTE — Patient Instructions (Signed)
Abdominal Bloating °When you have abdominal bloating, your abdomen may feel full, tight, or painful. It may also look bigger than normal or swollen (distended). Common causes of abdominal bloating include: °· Swallowing air. °· Constipation. °· Problems digesting food. °· Eating too much. °· Irritable bowel syndrome. This is a condition that affects the large intestine. °· Lactose intolerance. This is an inability to digest lactose, a natural sugar in dairy products. °· Celiac disease. This is a condition that affects the ability to digest gluten, a protein found in some grains. °· Gastroparesis. This is a condition that slows down the movement of food in the stomach and small intestine. It is more common in people with diabetes mellitus. °· Gastroesophageal reflux disease (GERD). This is a digestive condition that makes stomach acid flow back into the esophagus. °· Urinary retention. This means that the body is holding onto urine, and the bladder cannot be emptied all the way. ° °Follow these instructions at home: °Eating and drinking °· Avoid eating too much. °· Try not to swallow air while talking or eating. °· Avoid eating while lying down. °· Avoid these foods and drinks: °? Foods that cause gas, such as broccoli, cabbage, cauliflower, and baked beans. °? Carbonated drinks. °? Hard candy. °? Chewing gum. °Medicines °· Take over-the-counter and prescription medicines only as told by your health care provider. °· Take probiotic medicines. These medicines contain live bacteria or yeasts that can help digestion. °· Take coated peppermint oil capsules. °Activity °· Try to exercise regularly. Exercise may help to relieve bloating that is caused by gas and relieve constipation. °General instructions °· Keep all follow-up visits as told by your health care provider. This is important. °Contact a health care provider if: °· You have nausea and vomiting. °· You have diarrhea. °· You have abdominal pain. °· You have  unusual weight loss or weight gain. °· You have severe pain, and medicines do not help. °Get help right away if: °· You have severe chest pain. °· You have trouble breathing. °· You have shortness of breath. °· You have trouble urinating. °· You have darker urine than normal. °· You have blood in your stools or have dark, tarry stools. °Summary °· Abdominal bloating means that the abdomen is swollen. °· Common causes of abdominal bloating are swallowing air, constipation, and problems digesting food. °· Avoid eating too much and avoid swallowing air. °· Avoid foods that cause gas, carbonated drinks, hard candy, and chewing gum. °This information is not intended to replace advice given to you by your health care provider. Make sure you discuss any questions you have with your health care provider. °Document Released: 05/08/2016 Document Revised: 05/08/2016 Document Reviewed: 05/08/2016 °Elsevier Interactive Patient Education © 2018 [employer] ° °

## 2017-11-29 NOTE — Progress Notes (Signed)
[facility] [place], [place] [address]  Internal MEDICINE  Office Visit Note  Patient Name: Shannon Li  [id number]  [id number]  Date of Service: 12/14/2017  Chief Complaint  Patient presents with  . Back Pain    started yesterday   . Abdominal Pain   Pt is here for a sick visit  Back Pain  This is a chronic problem. The current episode started more than 1 year ago. The problem occurs daily. The problem is unchanged. The pain is present in the lumbar spine. Associated symptoms include abdominal pain. Pertinent negatives include no chest pain, dysuria, fever or numbness. The treatment provided no relief.  Abdominal Pain  This is a new problem. The current episode started in the past 7 days. The onset quality is gradual. The problem occurs intermittently. The problem has been unchanged. The pain is located in the generalized abdominal region, left flank and right flank. The pain is at a severity of 8/10. The quality of the pain is aching, cramping and dull. Associated symptoms include nausea. Pertinent negatives include no arthralgias, constipation, diarrhea, dysuria, fever, frequency or vomiting. The pain is aggravated by eating. The pain is relieved by bowel movements.   Current Medication:  Outpatient Encounter Medications as of 11/29/2017  Medication Sig  . albuterol (PROVENTIL HFA) 108 (90 Base) MCG/ACT inhaler Inhale 2 puffs into the lungs every 4 (four) hours as needed for wheezing or shortness of breath.  Marland Kitchen amLODipine (NORVASC) 2.5 MG tablet Take 1 tablet (2.5 mg total) by mouth daily.  . bisoprolol-hydrochlorothiazide (ZIAC) 2.5-6.25 MG tablet Take 2 tablets by mouth every morning for blood pressure  . escitalopram (LEXAPRO) 20 MG tablet Take one tablet by mouth daily  . betamethasone, augmented, (DIPROLENE) 0.05 % gel Apply topically 2 (two) times daily. Apply on affected area twice a day (Patient not taking: Reported on 11/29/2017)  .  hydrocortisone ointment 0.5 % Apply 1 application topically 2 (two) times daily. (Patient not taking: Reported on 11/29/2017)  . [DISCONTINUED] levofloxacin (LEVAQUIN) 500 MG tablet Take 1 tablet (500 mg total) by mouth daily. (Patient not taking: Reported on 11/29/2017)   No facility-administered encounter medications on file as of 11/29/2017.    Medical History: Past Medical History:  Diagnosis Date  . Anxiety   . Arthritis   . Atrophic kidney   . Chronic constipation   . Colon polyp 2013  . COPD (chronic obstructive pulmonary disease) ([place])   . Depression   . Diverticulosis   . Esophageal stricture   . Esophagitis   . Fibrocystic breast disease   . GERD (gastroesophageal reflux disease)   . History of hiatal hernia   . Hypertension   . IBS (irritable bowel syndrome)   . Menopause   . Ovarian cyst   . Plantar fasciitis   . Postmenopausal   . Renal artery occlusion ([place])   . Renal insufficiency    Right kidney is small and not functioning.     Vital Signs: BP (!) 148/80   Pulse 67   Temp 97.8 F (36.6 C)   Resp 16   Ht 5\' 8"  (1.727 m)   Wt 189 lb 9.6 oz (86 kg)   SpO2 95%   BMI 28.83 kg/m    Review of Systems  Constitutional: Negative for chills, fatigue, fever and unexpected weight change.  HENT: Negative for congestion, rhinorrhea, sneezing and sore throat.   Eyes: Negative for photophobia, pain and redness.  Respiratory: Negative for cough, chest  tightness and shortness of breath.   Cardiovascular: Negative for chest pain and palpitations.  Gastrointestinal: Positive for abdominal pain and nausea. Negative for constipation, diarrhea and vomiting.  Endocrine: Negative.   Genitourinary: Negative for dysuria and frequency.  Musculoskeletal: Positive for back pain. Negative for arthralgias, joint swelling and neck pain.  Skin: Negative for rash.  Allergic/Immunologic: Negative.   Neurological: Negative for tremors and numbness.  Hematological: Negative for  adenopathy. Does not bruise/bleed easily.  Psychiatric/Behavioral: Negative for behavioral problems and sleep disturbance. The patient is not nervous/anxious.     Physical Exam  Constitutional: She is oriented to person, place, and time. She appears well-developed and well-nourished. No distress.  HENT:  Head: Normocephalic and atraumatic.  Mouth/Throat: Oropharynx is clear and moist. No oropharyngeal exudate.  Eyes: Pupils are equal, round, and reactive to light. EOM are normal.  Neck: Normal range of motion. Neck supple. No JVD present. No tracheal deviation present. No thyromegaly present.  Cardiovascular: Normal rate, regular rhythm and normal heart sounds. Exam reveals no gallop and no friction rub.  No murmur heard. Pulmonary/Chest: Effort normal and breath sounds normal. No respiratory distress. She has no wheezes. She has no rales. She exhibits no tenderness.  Abdominal: Soft. There is no tenderness. There is no guarding.  Musculoskeletal: Normal range of motion.  Lymphadenopathy:    She has no cervical adenopathy.  Neurological: She is alert and oriented to person, place, and time. No cranial nerve deficit.  Skin: Skin is warm and dry. She is not diaphoretic.  Psychiatric: She has a normal mood and affect. Her behavior is normal. Judgment and thought content normal.  Nursing note and vitals reviewed.   Assessment/Plan: 1. Generalized abdominal pain Diffuse generalized Abd pain.  - CT Abdomen Pelvis W Contrast; Future  2. Essential hypertension Elevated today, pt in pain.  Will follow up at next visit.   3. Acute bilateral low back pain without sciatica CT of Abd/pelvis.    4. Dysuria Urine Clear. Pt has history of one non-working Kidney.  - POCT Urinalysis Dipstick  General Counseling: ivanell deshotel understanding of the findings of todays visit and agrees with plan of treatment. I have discussed any further diagnostic evaluation that may be needed or ordered  today. We also reviewed her medications today. she has been encouraged to call the office with any questions or concerns that should arise related to todays visit.   Orders Placed This Encounter  Procedures  . CT Abdomen Pelvis W Contrast  . POCT Urinalysis Dipstick     Time spent: 25 Minutes  This patient was seen by Orson Gear AGNP-C in Collaboration with Dr Lavera Guise as a part of collaborative care agreement

## 2017-12-07 ENCOUNTER — Ambulatory Visit (INDEPENDENT_AMBULATORY_CARE_PROVIDER_SITE_OTHER): Payer: Medicare Other | Admitting: Adult Health

## 2017-12-07 ENCOUNTER — Encounter: Payer: Self-pay | Admitting: Adult Health

## 2017-12-07 VITALS — BP 118/82 | HR 76 | Resp 16 | Ht 68.0 in | Wt 189.0 lb

## 2017-12-07 DIAGNOSIS — R1084 Generalized abdominal pain: Secondary | ICD-10-CM

## 2017-12-07 DIAGNOSIS — I10 Essential (primary) hypertension: Secondary | ICD-10-CM | POA: Diagnosis not present

## 2017-12-07 DIAGNOSIS — M545 Low back pain, unspecified: Secondary | ICD-10-CM

## 2017-12-07 NOTE — Patient Instructions (Signed)

## 2017-12-07 NOTE — Progress Notes (Signed)
[facility] [place], [place] [address]  Internal MEDICINE  Office Visit Note  Patient Name: Shannon Li  [id number]  [id number]  Date of Service: 12/07/2017  Chief Complaint  Patient presents with  . Abdominal Pain    started to ease some last wednesday, follow up ct    HPI Pt here for follow up on CT scan. She reports her abdominal pain has gotten some better.  She continues to report feeling "full" and like her bowels will not empty.  She reports having to take Senna daily, or she will not have a bowel movement.  Her CT scan showed Diverticulosis without inflammation.  She has seen GI in the past.     Current Medication: Outpatient Encounter Medications as of 12/07/2017  Medication Sig  . albuterol (PROVENTIL HFA) 108 (90 Base) MCG/ACT inhaler Inhale 2 puffs into the lungs every 4 (four) hours as needed for wheezing or shortness of breath.  Marland Kitchen amLODipine (NORVASC) 2.5 MG tablet Take 1 tablet (2.5 mg total) by mouth daily.  . betamethasone, augmented, (DIPROLENE) 0.05 % gel Apply topically 2 (two) times daily. Apply on affected area twice a day (Patient not taking: Reported on 11/29/2017)  . bisoprolol-hydrochlorothiazide (ZIAC) 2.5-6.25 MG tablet Take 2 tablets by mouth every morning for blood pressure  . escitalopram (LEXAPRO) 20 MG tablet Take one tablet by mouth daily  . hydrocortisone ointment 0.5 % Apply 1 application topically 2 (two) times daily. (Patient not taking: Reported on 11/29/2017)   No facility-administered encounter medications on file as of 12/07/2017.    Surgical History: Past Surgical History:  Procedure Laterality Date  . ABSCESS DRAINAGE  11/17/13   buttock right  . BREAST BIOPSY Right    neg  . CHOLECYSTECTOMY  2008  . COLONOSCOPY  2014   [facility] in [place]  . COLONOSCOPY WITH PROPOFOL N/A 08/26/2015   Procedure: COLONOSCOPY WITH PROPOFOL;  Surgeon: Hulen Luster, MD;  Location: [facility] ENDOSCOPY;  Service:  Gastroenterology;  Laterality: N/A;  . ESOPHAGOGASTRODUODENOSCOPY N/A 11/15/2014   Procedure: ESOPHAGOGASTRODUODENOSCOPY (EGD);  Surgeon: Hulen Luster, MD;  Location: [facility] ENDOSCOPY;  Service: Gastroenterology;  Laterality: N/A;  . UPPER GI ENDOSCOPY  2014   [facility] in [place] History: Past Medical History:  Diagnosis Date  . Anxiety   . Arthritis   . Atrophic kidney   . Chronic constipation   . Colon polyp 2013  . COPD (chronic obstructive pulmonary disease) ([place])   . Depression   . Diverticulosis   . Esophageal stricture   . Esophagitis   . Fibrocystic breast disease   . GERD (gastroesophageal reflux disease)   . History of hiatal hernia   . Hypertension   . IBS (irritable bowel syndrome)   . Menopause   . Ovarian cyst   . Plantar fasciitis   . Postmenopausal   . Renal artery occlusion ([place])   . Renal insufficiency    Right kidney is small and not functioning.    Family History: Family History  Problem Relation Age of Onset  . Heart disease Mother   . Heart attack Mother   . Diabetes Mother   . Alzheimer's disease Father   . Breast cancer Sister 107  . Lung cancer Brother   . Brain cancer Brother   . Lung cancer Sister   . Bladder Cancer Sister   . Lung cancer Brother   . Leukemia Brother   . Diabetes Brother  Social History   Socioeconomic History  . Marital status: Widowed    Spouse name: Not on file  . Number of children: Not on file  . Years of education: Not on file  . Highest education level: Not on file  Occupational History  . Not on file  Social Needs  . Financial resource strain: Not on file  . Food insecurity:    Worry: Not on file    Inability: Not on file  . Transportation needs:    Medical: Not on file    Non-medical: Not on file  Tobacco Use  . Smoking status: Current Every Day Smoker    Packs/day: 1.00    Years: 30.00    Pack years: 30.00    Types: Cigarettes  . Smokeless tobacco: Never Used  Substance  and Sexual Activity  . Alcohol use: No  . Drug use: No  . Sexual activity: Not on file  Lifestyle  . Physical activity:    Days per week: Not on file    Minutes per session: Not on file  . Stress: Not on file  Relationships  . Social connections:    Talks on phone: Not on file    Gets together: Not on file    Attends religious service: Not on file    Active member of club or organization: Not on file    Attends meetings of clubs or organizations: Not on file    Relationship status: Not on file  . Intimate partner violence:    Fear of current or ex partner: Not on file    Emotionally abused: Not on file    Physically abused: Not on file    Forced sexual activity: Not on file  Other Topics Concern  . Not on file  Social History Narrative  . Not on file      Review of Systems  Constitutional: Negative for chills, fatigue and unexpected weight change.  HENT: Negative for congestion, rhinorrhea, sneezing and sore throat.   Eyes: Negative for photophobia, pain and redness.  Respiratory: Negative for cough, chest tightness and shortness of breath.   Cardiovascular: Negative for chest pain and palpitations.  Gastrointestinal: Negative for abdominal pain, constipation, diarrhea, nausea and vomiting.  Endocrine: Negative.   Genitourinary: Negative for dysuria and frequency.  Musculoskeletal: Negative for arthralgias, back pain, joint swelling and neck pain.  Skin: Negative for rash.  Allergic/Immunologic: Negative.   Neurological: Negative for tremors and numbness.  Hematological: Negative for adenopathy. Does not bruise/bleed easily.  Psychiatric/Behavioral: Negative for behavioral problems and sleep disturbance. The patient is not nervous/anxious.     Vital Signs: BP 118/82   Pulse 76   Resp 16   Ht 5\' 8"  (1.727 m)   Wt 189 lb (85.7 kg)   SpO2 98%   BMI 28.74 kg/m    Physical Exam  Constitutional: She is oriented to person, place, and time. She appears  well-developed and well-nourished. No distress.  HENT:  Head: Normocephalic and atraumatic.  Mouth/Throat: Oropharynx is clear and moist. No oropharyngeal exudate.  Eyes: Pupils are equal, round, and reactive to light. EOM are normal.  Neck: Normal range of motion. Neck supple. No JVD present. No tracheal deviation present. No thyromegaly present.  Cardiovascular: Normal rate, regular rhythm and normal heart sounds. Exam reveals no gallop and no friction rub.  No murmur heard. Pulmonary/Chest: Effort normal and breath sounds normal. No respiratory distress. She has no wheezes. She has no rales. She exhibits no tenderness.  Abdominal: Soft.  There is no tenderness. There is no guarding.  Musculoskeletal: Normal range of motion.  Lymphadenopathy:    She has no cervical adenopathy.  Neurological: She is alert and oriented to person, place, and time. No cranial nerve deficit.  Skin: Skin is warm and dry. She is not diaphoretic.  Psychiatric: She has a normal mood and affect. Her behavior is normal. Judgment and thought content normal.  Nursing note and vitals reviewed.  Assessment/Plan: 1. Generalized abdominal pain Slightly better than last week.  Pt continues to reports other abd symptoms of Constipation, bloating, and gas.  She is going to call her GI doctor to set up a follow up to discuss new symptoms.   2. Essential hypertension Controlled, continue medications as discussed.   3. Acute bilateral low back pain without sciatica Unchanged.  Continue current activity and therapy.    General Counseling: secret kristensen understanding of the findings of todays visit and agrees with plan of treatment. I have discussed any further diagnostic evaluation that may be needed or ordered today. We also reviewed her medications today. she has been encouraged to call the office with any questions or concerns that should arise related to todays visit.   Time spent: 25 Minutes   This patient was  seen by Orson Gear AGNP-C in Collaboration with Dr Lavera Guise as a part of collaborative care agreement    Dr Lavera Guise Internal medicine

## 2017-12-14 ENCOUNTER — Other Ambulatory Visit (INDEPENDENT_AMBULATORY_CARE_PROVIDER_SITE_OTHER): Payer: Self-pay | Admitting: Nurse Practitioner

## 2018-01-20 DIAGNOSIS — J019 Acute sinusitis, unspecified: Secondary | ICD-10-CM | POA: Diagnosis not present

## 2018-03-03 ENCOUNTER — Encounter: Payer: Self-pay | Admitting: Nurse Practitioner

## 2018-03-03 ENCOUNTER — Ambulatory Visit (INDEPENDENT_AMBULATORY_CARE_PROVIDER_SITE_OTHER): Payer: Medicare Other | Admitting: Nurse Practitioner

## 2018-03-03 VITALS — BP 148/80 | HR 60 | Temp 96.0°F | Resp 16 | Ht 68.0 in | Wt 189.0 lb

## 2018-03-03 DIAGNOSIS — Z634 Disappearance and death of family member: Secondary | ICD-10-CM | POA: Diagnosis not present

## 2018-03-03 DIAGNOSIS — I10 Essential (primary) hypertension: Secondary | ICD-10-CM | POA: Diagnosis not present

## 2018-03-03 DIAGNOSIS — R059 Cough, unspecified: Secondary | ICD-10-CM | POA: Insufficient documentation

## 2018-03-03 DIAGNOSIS — R05 Cough: Secondary | ICD-10-CM | POA: Diagnosis not present

## 2018-03-03 DIAGNOSIS — J069 Acute upper respiratory infection, unspecified: Secondary | ICD-10-CM | POA: Insufficient documentation

## 2018-03-03 MED ORDER — BISOPROLOL-HYDROCHLOROTHIAZIDE 2.5-6.25 MG PO TABS: ORAL_TABLET | ORAL | 1 refills | Status: DC

## 2018-03-03 MED ORDER — METHYLPREDNISOLONE 4 MG PO TBPK: ORAL_TABLET | ORAL | 0 refills | Status: DC

## 2018-03-03 MED ORDER — PROMETHAZINE-CODEINE 6.25-10 MG/5ML PO SYRP: 5.0000 mL | ORAL_SOLUTION | Freq: Three times a day (TID) | ORAL | 0 refills | Status: DC | PRN

## 2018-03-03 MED ORDER — LEVOFLOXACIN 500 MG PO TABS: 500.0000 mg | ORAL_TABLET | Freq: Every day | ORAL | 0 refills | Status: DC

## 2018-03-03 NOTE — Progress Notes (Signed)
[facility] [place], [place] [address]  Internal MEDICINE  Office Visit Note  Patient Name: Shannon Li  [id number]  [id number]  Date of Service: 03/03/2018  Chief Complaint  Patient presents with  . Hypertension  . Depression  . Cough  . Sinusitis    The patient states that she has been dealing with sinus type infection for over a month. Did see Dr. Tami Ribas October 3. Was treated with levaquin and methylprednisolone. Took the levaquin for about 2 weeks. Did feel better for a short period of time, but then symptoms came right back. Has also had some recent stress. Sister passed away 3 weeks ago. Today would have been her birthday. Today, the patient has nasal congestion, headache, and cough. She did try to take tessalon perls last night, but this did not help.       Current Medication: Outpatient Encounter Medications as of 03/03/2018  Medication Sig  . albuterol (PROVENTIL HFA) 108 (90 Base) MCG/ACT inhaler Inhale 2 puffs into the lungs every 4 (four) hours as needed for wheezing or shortness of breath.  Marland Kitchen amLODipine (NORVASC) 2.5 MG tablet Take 1 tablet (2.5 mg total) by mouth daily.  . bisoprolol-hydrochlorothiazide (ZIAC) 2.5-6.25 MG tablet Take 1 tablet po qd for blood pressure  . escitalopram (LEXAPRO) 20 MG tablet Take one tablet by mouth daily  . hydrocortisone ointment 0.5 % Apply 1 application topically 2 (two) times daily. (Patient not taking: Reported on 11/29/2017)  . levofloxacin (LEVAQUIN) 500 MG tablet Take 1 tablet (500 mg total) by mouth daily.  . methylPREDNISolone (MEDROL) 4 MG TBPK tablet Take by mouth as directed for 6 days  . promethazine-codeine (PHENERGAN WITH CODEINE) 6.25-10 MG/5ML syrup Take 5 mLs by mouth 3 (three) times daily as needed for cough.  . [DISCONTINUED] betamethasone, augmented, (DIPROLENE) 0.05 % gel Apply topically 2 (two) times daily. Apply on affected area twice a day (Patient not taking: Reported on  11/29/2017)  . [DISCONTINUED] bisoprolol-hydrochlorothiazide (ZIAC) 2.5-6.25 MG tablet Take 2 tablets by mouth every morning for blood pressure   No facility-administered encounter medications on file as of 03/03/2018.     Surgical History: Past Surgical History:  Procedure Laterality Date  . ABSCESS DRAINAGE  11/17/13   buttock right  . BREAST BIOPSY Right    neg  . CHOLECYSTECTOMY  2008  . COLONOSCOPY  2014   [facility] [place]  . COLONOSCOPY WITH PROPOFOL N/A 08/26/2015   Procedure: COLONOSCOPY WITH PROPOFOL;  Surgeon: Hulen Luster, MD;  Location: [facility] ENDOSCOPY;  Service: Gastroenterology;  Laterality: N/A;  . ESOPHAGOGASTRODUODENOSCOPY N/A 11/15/2014   Procedure: ESOPHAGOGASTRODUODENOSCOPY (EGD);  Surgeon: Hulen Luster, MD;  Location: [facility] ENDOSCOPY;  Service: Gastroenterology;  Laterality: N/A;  . UPPER GI ENDOSCOPY  2014   [facility] in [place] History: Past Medical History:  Diagnosis Date  . Anxiety   . Arthritis   . Atrophic kidney   . Chronic constipation   . Colon polyp 2013  . COPD (chronic obstructive pulmonary disease) ([place])   . Depression   . Diverticulosis   . Esophageal stricture   . Esophagitis   . Fibrocystic breast disease   . GERD (gastroesophageal reflux disease)   . History of hiatal hernia   . Hypertension   . IBS (irritable bowel syndrome)   . Menopause   . Ovarian cyst   . Plantar fasciitis   . Postmenopausal   . Renal artery occlusion ([place])   .  Renal insufficiency    Right kidney is small and not functioning.    Family History: Family History  Problem Relation Age of Onset  . Heart disease Mother   . Heart attack Mother   . Diabetes Mother   . Alzheimer's disease Father   . Breast cancer Sister 59  . Lung cancer Brother   . Brain cancer Brother   . Lung cancer Sister   . Bladder Cancer Sister   . Lung cancer Brother   . Leukemia Brother   . Diabetes Brother     Social History   Socioeconomic History   . Marital status: Widowed    Spouse name: Not on file  . Number of children: Not on file  . Years of education: Not on file  . Highest education level: Not on file  Occupational History  . Not on file  Social Needs  . Financial resource strain: Not on file  . Food insecurity:    Worry: Not on file    Inability: Not on file  . Transportation needs:    Medical: Not on file    Non-medical: Not on file  Tobacco Use  . Smoking status: Current Every Day Smoker    Packs/day: 1.00    Years: 30.00    Pack years: 30.00    Types: Cigarettes  . Smokeless tobacco: Never Used  Substance and Sexual Activity  . Alcohol use: No  . Drug use: No  . Sexual activity: Not on file  Lifestyle  . Physical activity:    Days per week: Not on file    Minutes per session: Not on file  . Stress: Not on file  Relationships  . Social connections:    Talks on phone: Not on file    Gets together: Not on file    Attends religious service: Not on file    Active member of club or organization: Not on file    Attends meetings of clubs or organizations: Not on file    Relationship status: Not on file  . Intimate partner violence:    Fear of current or ex partner: Not on file    Emotionally abused: Not on file    Physically abused: Not on file    Forced sexual activity: Not on file  Other Topics Concern  . Not on file  Social History Narrative  . Not on file      Review of Systems  Constitutional: Positive for fatigue. Negative for chills and unexpected weight change.  HENT: Positive for congestion, postnasal drip, rhinorrhea, sinus pressure and sore throat. Negative for sneezing.   Respiratory: Positive for cough. Negative for chest tightness, shortness of breath and wheezing.   Cardiovascular: Negative for chest pain and palpitations.       Mildly elevated blood pressure today  Gastrointestinal: Negative for abdominal pain, constipation, diarrhea, nausea and vomiting.  Endocrine: Negative for  cold intolerance, heat intolerance, polydipsia, polyphagia and polyuria.  Genitourinary: Negative for dysuria and frequency.  Musculoskeletal: Negative for arthralgias, back pain, joint swelling and neck pain.  Skin: Negative for rash.  Allergic/Immunologic: Positive for environmental allergies.  Neurological: Positive for headaches. Negative for tremors and numbness.  Hematological: Negative for adenopathy. Does not bruise/bleed easily.  Psychiatric/Behavioral: Positive for dysphoric mood. Negative for behavioral problems (Depression), sleep disturbance and suicidal ideas. The patient is not nervous/anxious.        Increased depression to recent death of her sister.     Today's Vitals   03/03/18 0836  BP: (!) 148/80  Pulse: 60  Resp: 16  Temp: (!) 96 F (35.6 C)  SpO2: 94%  Weight: 189 lb (85.7 kg)  Height: 5\' 8"  (1.727 m)    Physical Exam  Constitutional: She is oriented to person, place, and time. She appears well-developed and well-nourished. She appears ill. No distress.  HENT:  Head: Normocephalic and atraumatic.  Right Ear: External ear normal.  Left Ear: External ear normal.  Nose: Rhinorrhea present. Right sinus exhibits frontal sinus tenderness. Left sinus exhibits frontal sinus tenderness.  Mouth/Throat: Posterior oropharyngeal erythema present. No oropharyngeal exudate.  Eyes: Pupils are equal, round, and reactive to light. EOM are normal.  Neck: Normal range of motion. Neck supple. No JVD present. No tracheal deviation present. No thyromegaly present.  Cardiovascular: Normal rate, regular rhythm and normal heart sounds. Exam reveals no gallop and no friction rub.  No murmur heard. Pulmonary/Chest: Effort normal and breath sounds normal. No respiratory distress. She has no wheezes. She has no rales. She exhibits no tenderness.  Congested, non-productive cough noted.   Musculoskeletal: Normal range of motion.  Lymphadenopathy:    She has no cervical adenopathy.   Neurological: She is alert and oriented to person, place, and time. No cranial nerve deficit.  Skin: Skin is warm and dry. She is not diaphoretic.  Psychiatric: She has a normal mood and affect. Her behavior is normal. Judgment and thought content normal.  Nursing note and vitals reviewed.  Assessment/Plan:  1. Acute upper respiratory infection Repeat round levofloxacin 500mg  daily for 10 days. Add medrol dose pack. Take as directed for 6 days. Rest and increase fluids. Continue using OTC medication to alleviate symptoms.  - methylPREDNISolone (MEDROL) 4 MG TBPK tablet; Take by mouth as directed for 6 days  Dispense: 21 tablet; Refill: 0 - levofloxacin (LEVAQUIN) 500 MG tablet; Take 1 tablet (500 mg total) by mouth daily.  Dispense: 10 tablet; Refill: 0  2. Cough Promethazine/codein cough syrup may be taken up to three times daily as needed for cough. Advised her to use with caution as may cause dizziness or drowsiness.  - methylPREDNISolone (MEDROL) 4 MG TBPK tablet; Take by mouth as directed for 6 days  Dispense: 21 tablet; Refill: 0 - promethazine-codeine (PHENERGAN WITH CODEINE) 6.25-10 MG/5ML syrup; Take 5 mLs by mouth 3 (three) times daily as needed for cough.  Dispense: 120 mL; Refill: 0  3. Essential hypertension Stable. Continue bp medication as prescribed.  - bisoprolol-hydrochlorothiazide (ZIAC) 2.5-6.25 MG tablet; Take 1 tablet po qd for blood pressure  Dispense: 180 tablet; Refill: 1  4. Bereavement due to life event Recent death of sister. Continue lexapro as prescribed. Seek bereavement counseling as indicated.   General Counseling: Shannon Li understanding of the findings of todays visit and agrees with plan of treatment. I have discussed any further diagnostic evaluation that may be needed or ordered today. We also reviewed her medications today. she has been encouraged to call the office with any questions or concerns that should arise related to todays  visit.  Rest and increase fluids. Continue using OTC medication to control symptoms.   This patient was seen by [place] with Dr Lavera Guise as a part of collaborative care agreement  Meds ordered this encounter  Medications  . bisoprolol-hydrochlorothiazide (ZIAC) 2.5-6.25 MG tablet    Sig: Take 1 tablet po qd for blood pressure    Dispense:  180 tablet    Refill:  1    Update prescription instructions.  Order Specific Question:   Supervising Provider    Answer:   Lavera Guise [[id number]]  . methylPREDNISolone (MEDROL) 4 MG TBPK tablet    Sig: Take by mouth as directed for 6 days    Dispense:  21 tablet    Refill:  0    Order Specific Question:   Supervising Provider    Answer:   Lavera Guise [place]  . levofloxacin (LEVAQUIN) 500 MG tablet    Sig: Take 1 tablet (500 mg total) by mouth daily.    Dispense:  10 tablet    Refill:  0    Order Specific Question:   Supervising Provider    Answer:   Lavera Guise [[id number]]  . promethazine-codeine (PHENERGAN WITH CODEINE) 6.25-10 MG/5ML syrup    Sig: Take 5 mLs by mouth 3 (three) times daily as needed for cough.    Dispense:  120 mL    Refill:  0    Order Specific Question:   Supervising Provider    Answer:   Lavera Guise [[id number]]    Time spent: 77 Minutes      Dr Lavera Guise Internal medicine

## 2018-03-21 ENCOUNTER — Other Ambulatory Visit: Payer: Self-pay | Admitting: Internal Medicine

## 2018-03-21 DIAGNOSIS — Z1231 Encounter for screening mammogram for malignant neoplasm of breast: Secondary | ICD-10-CM

## 2018-04-06 DIAGNOSIS — Z23 Encounter for immunization: Secondary | ICD-10-CM | POA: Diagnosis not present

## 2018-04-08 ENCOUNTER — Ambulatory Visit
Admission: RE | Admit: 2018-04-08 | Discharge: 2018-04-08 | Disposition: A | Discharge status: Home / Self Care | Payer: Medicare Other | Source: Ambulatory Visit | Attending: Internal Medicine | Admitting: Internal Medicine

## 2018-04-08 DIAGNOSIS — Z1231 Encounter for screening mammogram for malignant neoplasm of breast: Secondary | ICD-10-CM | POA: Insufficient documentation

## 2018-04-22 ENCOUNTER — Telehealth: Payer: Self-pay

## 2018-04-22 DIAGNOSIS — I10 Essential (primary) hypertension: Secondary | ICD-10-CM

## 2018-04-22 MED ORDER — PANTOPRAZOLE SODIUM 40 MG PO TBEC: DELAYED_RELEASE_TABLET | ORAL | 0 refills | Status: DC

## 2018-04-22 MED ORDER — ESCITALOPRAM OXALATE 20 MG PO TABS: ORAL_TABLET | ORAL | 1 refills | Status: DC

## 2018-04-22 MED ORDER — AMLODIPINE BESYLATE 2.5 MG PO TABS: ORAL_TABLET | ORAL | 1 refills | Status: DC

## 2018-04-22 MED ORDER — BISOPROLOL-HYDROCHLOROTHIAZIDE 2.5-6.25 MG PO TABS: ORAL_TABLET | ORAL | 0 refills | Status: DC

## 2018-04-22 NOTE — Telephone Encounter (Signed)
Pt called for medication refills

## 2018-05-03 DIAGNOSIS — H2513 Age-related nuclear cataract, bilateral: Secondary | ICD-10-CM | POA: Diagnosis not present

## 2018-06-14 DIAGNOSIS — D2272 Melanocytic nevi of left lower limb, including hip: Secondary | ICD-10-CM | POA: Diagnosis not present

## 2018-06-14 DIAGNOSIS — D2271 Melanocytic nevi of right lower limb, including hip: Secondary | ICD-10-CM | POA: Diagnosis not present

## 2018-06-14 DIAGNOSIS — D2261 Melanocytic nevi of right upper limb, including shoulder: Secondary | ICD-10-CM | POA: Diagnosis not present

## 2018-06-14 DIAGNOSIS — D2262 Melanocytic nevi of left upper limb, including shoulder: Secondary | ICD-10-CM | POA: Diagnosis not present

## 2018-06-14 DIAGNOSIS — L570 Actinic keratosis: Secondary | ICD-10-CM | POA: Diagnosis not present

## 2018-06-14 DIAGNOSIS — D225 Melanocytic nevi of trunk: Secondary | ICD-10-CM | POA: Diagnosis not present

## 2018-06-14 DIAGNOSIS — D485 Neoplasm of uncertain behavior of skin: Secondary | ICD-10-CM | POA: Diagnosis not present

## 2018-06-14 DIAGNOSIS — D0462 Carcinoma in situ of skin of left upper limb, including shoulder: Secondary | ICD-10-CM | POA: Diagnosis not present

## 2018-06-14 DIAGNOSIS — H61031 Chondritis of right external ear: Secondary | ICD-10-CM | POA: Diagnosis not present

## 2018-06-14 DIAGNOSIS — X32XXXA Exposure to sunlight, initial encounter: Secondary | ICD-10-CM | POA: Diagnosis not present

## 2018-06-14 DIAGNOSIS — L820 Inflamed seborrheic keratosis: Secondary | ICD-10-CM | POA: Diagnosis not present

## 2018-06-24 ENCOUNTER — Encounter: Payer: Self-pay | Admitting: Adult Health

## 2018-06-24 ENCOUNTER — Other Ambulatory Visit: Payer: Self-pay

## 2018-06-24 ENCOUNTER — Ambulatory Visit (INDEPENDENT_AMBULATORY_CARE_PROVIDER_SITE_OTHER): Payer: Medicare Other | Admitting: Adult Health

## 2018-06-24 VITALS — BP 132/88 | HR 59 | Temp 97.7°F | Resp 16 | Ht 68.0 in | Wt 187.0 lb

## 2018-06-24 DIAGNOSIS — I10 Essential (primary) hypertension: Secondary | ICD-10-CM | POA: Diagnosis not present

## 2018-06-24 DIAGNOSIS — J0100 Acute maxillary sinusitis, unspecified: Secondary | ICD-10-CM | POA: Diagnosis not present

## 2018-06-24 MED ORDER — SULFAMETHOXAZOLE-TRIMETHOPRIM 800-160 MG PO TABS: 1.0000 | ORAL_TABLET | Freq: Two times a day (BID) | ORAL | 0 refills | Status: DC

## 2018-06-24 NOTE — Patient Instructions (Signed)

## 2018-06-24 NOTE — Progress Notes (Signed)
[facility] [place], [place] [address]  Internal MEDICINE  Office Visit Note  Patient Name: Shannon Li  [id number]  [id number]  Date of Service: 06/24/2018  Chief Complaint  Patient presents with  . Headache  . Sinusitis     HPI Pt is here for a sick visit. She reports 3 days of sinus pain and pressure.  She reports pain in her face, and ears as well.  She denies fever and chills.       Current Medication:  Outpatient Encounter Medications as of 06/24/2018  Medication Sig  . albuterol (PROVENTIL HFA) 108 (90 Base) MCG/ACT inhaler Inhale 2 puffs into the lungs every 4 (four) hours as needed for wheezing or shortness of breath.  Marland Kitchen amLODipine (NORVASC) 2.5 MG tablet Take one tablet by mouth daily.  . bisoprolol-hydrochlorothiazide (ZIAC) 2.5-6.25 MG tablet Take 1 tablet po qd for blood pressure  . escitalopram (LEXAPRO) 20 MG tablet Take one tablet by mouth daily  . pantoprazole (PROTONIX) 40 MG tablet Take 1 tablet two times daily  . sulfamethoxazole-trimethoprim (BACTRIM DS,SEPTRA DS) 800-160 MG tablet Take 1 tablet by mouth 2 (two) times daily.  . [DISCONTINUED] hydrocortisone ointment 0.5 % Apply 1 application topically 2 (two) times daily. (Patient not taking: Reported on 11/29/2017)  . [DISCONTINUED] levofloxacin (LEVAQUIN) 500 MG tablet Take 1 tablet (500 mg total) by mouth daily. (Patient not taking: Reported on 06/24/2018)  . [DISCONTINUED] methylPREDNISolone (MEDROL) 4 MG TBPK tablet Take by mouth as directed for 6 days (Patient not taking: Reported on 06/24/2018)  . [DISCONTINUED] promethazine-codeine (PHENERGAN WITH CODEINE) 6.25-10 MG/5ML syrup Take 5 mLs by mouth 3 (three) times daily as needed for cough. (Patient not taking: Reported on 06/24/2018)   No facility-administered encounter medications on file as of 06/24/2018.       Medical History: Past Medical History:  Diagnosis Date  . Anxiety   . Arthritis   . Atrophic kidney   .  Chronic constipation   . Colon polyp 2013  . COPD (chronic obstructive pulmonary disease) ([place])   . Depression   . Diverticulosis   . Esophageal stricture   . Esophagitis   . Fibrocystic breast disease   . GERD (gastroesophageal reflux disease)   . History of hiatal hernia   . Hypertension   . IBS (irritable bowel syndrome)   . Menopause   . Ovarian cyst   . Plantar fasciitis   . Postmenopausal   . Renal artery occlusion ([place])   . Renal insufficiency    Right kidney is small and not functioning.     Vital Signs: BP 132/88   Pulse (!) 59   Temp 97.7 F (36.5 C) (Oral)   Resp 16   Ht 5\' 8"  (1.727 m)   Wt 187 lb (84.8 kg)   SpO2 94%   BMI 28.43 kg/m    Review of Systems  Constitutional: Negative for chills, fatigue and unexpected weight change.  HENT: Positive for ear pain, postnasal drip, rhinorrhea, sinus pressure and sinus pain. Negative for congestion, sneezing and sore throat.   Eyes: Negative for photophobia, pain and redness.  Respiratory: Negative for cough, chest tightness and shortness of breath.   Cardiovascular: Negative for chest pain and palpitations.  Gastrointestinal: Negative for abdominal pain, constipation, diarrhea, nausea and vomiting.  Endocrine: Negative.   Genitourinary: Negative for dysuria and frequency.  Musculoskeletal: Negative for arthralgias, back pain, joint swelling and neck pain.  Skin: Negative for rash.  Allergic/Immunologic: Negative.  Neurological: Negative for tremors and numbness.  Hematological: Negative for adenopathy. Does not bruise/bleed easily.  Psychiatric/Behavioral: Negative for behavioral problems and sleep disturbance. The patient is not nervous/anxious.     Physical Exam Vitals signs and nursing note reviewed.  Constitutional:      General: She is not in acute distress.    Appearance: She is well-developed. She is not diaphoretic.  HENT:     Head: Normocephalic and atraumatic.     Mouth/Throat:     Pharynx:  No oropharyngeal exudate.  Eyes:     Pupils: Pupils are equal, round, and reactive to light.  Neck:     Musculoskeletal: Normal range of motion and neck supple.     Thyroid: No thyromegaly.     Vascular: No JVD.     Trachea: No tracheal deviation.  Cardiovascular:     Rate and Rhythm: Normal rate and regular rhythm.     Heart sounds: Normal heart sounds. No murmur. No friction rub. No gallop.   Pulmonary:     Effort: Pulmonary effort is normal. No respiratory distress.     Breath sounds: Normal breath sounds. No wheezing or rales.  Chest:     Chest wall: No tenderness.  Abdominal:     Palpations: Abdomen is soft.     Tenderness: There is no abdominal tenderness. There is no guarding.  Musculoskeletal: Normal range of motion.  Lymphadenopathy:     Cervical: No cervical adenopathy.  Skin:    General: Skin is warm and dry.  Neurological:     Mental Status: She is alert and oriented to person, place, and time.     Cranial Nerves: No cranial nerve deficit.  Psychiatric:        Behavior: Behavior normal.        Thought Content: Thought content normal.        Judgment: Judgment normal.    Assessment/Plan: 1. Acute non-recurrent maxillary sinusitis Patient provided with course of Bactrim.  Instructed patient to take medication until completion.  Return to clinic if symptoms fail to improve at the completion of antibiotics.  She can continue to take over-the-counter medications for symptom management. - sulfamethoxazole-trimethoprim (BACTRIM DS,SEPTRA DS) 800-160 MG tablet; Take 1 tablet by mouth 2 (two) times daily.  Dispense: 20 tablet; Refill: 0  2. Essential hypertension Stable.  Patient BP today 132/88.  Heart rate initially low at 59 upon check-in.  During exam heart rate was found to be 66.  Continue to monitor at future visits.  General Counseling: Shannon Li understanding of the findings of todays visit and agrees with plan of treatment. I have discussed any further  diagnostic evaluation that may be needed or ordered today. We also reviewed her medications today. she has been encouraged to call the office with any questions or concerns that should arise related to todays visit.   No orders of the defined types were placed in this encounter.   Meds ordered this encounter  Medications  . sulfamethoxazole-trimethoprim (BACTRIM DS,SEPTRA DS) 800-160 MG tablet    Sig: Take 1 tablet by mouth 2 (two) times daily.    Dispense:  20 tablet    Refill:  0    Time spent: 25  Minutes  This patient was seen by Orson Gear AGNP-C in Collaboration with Dr Lavera Guise as a part of collaborative care agreement.  Kendell Bane AGNP-C Internal Medicine

## 2018-07-20 ENCOUNTER — Other Ambulatory Visit: Payer: Self-pay

## 2018-07-20 MED ORDER — PANTOPRAZOLE SODIUM 40 MG PO TBEC: DELAYED_RELEASE_TABLET | ORAL | 0 refills | Status: DC

## 2018-08-22 ENCOUNTER — Other Ambulatory Visit: Payer: Self-pay | Admitting: Nurse Practitioner

## 2018-08-22 DIAGNOSIS — E1122 Type 2 diabetes mellitus with diabetic chronic kidney disease: Secondary | ICD-10-CM | POA: Diagnosis not present

## 2018-08-22 DIAGNOSIS — Z0001 Encounter for general adult medical examination with abnormal findings: Secondary | ICD-10-CM | POA: Diagnosis not present

## 2018-08-22 DIAGNOSIS — I10 Essential (primary) hypertension: Secondary | ICD-10-CM | POA: Diagnosis not present

## 2018-08-23 LAB — CBC WITH DIFFERENTIAL/PLATELET
Basophils Absolute: 0.1 10*3/uL (ref 0.0–0.2)
Basos: 1 %
EOS (ABSOLUTE): 0.2 10*3/uL (ref 0.0–0.4)
Eos: 3 %
Hematocrit: 45.4 % (ref 34.0–46.6)
Hemoglobin: 14.9 g/dL (ref 11.1–15.9)
Immature Grans (Abs): 0 10*3/uL (ref 0.0–0.1)
Immature Granulocytes: 0 %
Lymphocytes Absolute: 1.9 10*3/uL (ref 0.7–3.1)
Lymphs: 29 %
MCH: 29.1 pg (ref 26.6–33.0)
MCHC: 32.8 g/dL (ref 31.5–35.7)
MCV: 89 fL (ref 79–97)
Monocytes Absolute: 0.5 10*3/uL (ref 0.1–0.9)
Monocytes: 8 %
Neutrophils Absolute: 3.9 10*3/uL (ref 1.4–7.0)
Neutrophils: 59 %
Platelets: 297 10*3/uL (ref 150–450)
RBC: 5.12 x10E6/uL (ref 3.77–5.28)
RDW: 13.5 % (ref 11.7–15.4)
WBC: 6.5 10*3/uL (ref 3.4–10.8)

## 2018-08-23 LAB — COMPREHENSIVE METABOLIC PANEL WITH GFR
ALT: 13 [IU]/L (ref 0–32)
AST: 18 [IU]/L (ref 0–40)
Albumin/Globulin Ratio: 2 (ref 1.2–2.2)
Albumin: 4.3 g/dL (ref 3.8–4.8)
Alkaline Phosphatase: 82 [IU]/L (ref 39–117)
BUN/Creatinine Ratio: 14 (ref 12–28)
BUN: 20 mg/dL (ref 8–27)
Bilirubin Total: 0.3 mg/dL (ref 0.0–1.2)
CO2: 23 mmol/L (ref 20–29)
Calcium: 9.7 mg/dL (ref 8.7–10.3)
Chloride: 101 mmol/L (ref 96–106)
Creatinine, Ser: 1.39 mg/dL — ABNORMAL HIGH (ref 0.57–1.00)
GFR calc Af Amer: 45 mL/min/{1.73_m2} — ABNORMAL LOW
GFR calc non Af Amer: 39 mL/min/{1.73_m2} — ABNORMAL LOW
Globulin, Total: 2.2 g/dL (ref 1.5–4.5)
Glucose: 97 mg/dL (ref 65–99)
Potassium: 4.3 mmol/L (ref 3.5–5.2)
Sodium: 139 mmol/L (ref 134–144)
Total Protein: 6.5 g/dL (ref 6.0–8.5)

## 2018-08-23 LAB — LIPID PANEL WITH LDL/HDL RATIO
Cholesterol, Total: 216 mg/dL — ABNORMAL HIGH (ref 100–199)
HDL: 39 mg/dL — ABNORMAL LOW
LDL Calculated: 146 mg/dL — ABNORMAL HIGH (ref 0–99)
LDl/HDL Ratio: 3.7 ratio — ABNORMAL HIGH (ref 0.0–3.2)
Triglycerides: 153 mg/dL — ABNORMAL HIGH (ref 0–149)
VLDL Cholesterol Cal: 31 mg/dL (ref 5–40)

## 2018-08-23 LAB — TSH: TSH: 1.83 u[IU]/mL (ref 0.450–4.500)

## 2018-08-23 LAB — T4, FREE: Free T4: 1.31 ng/dL (ref 0.82–1.77)

## 2018-09-01 ENCOUNTER — Encounter: Payer: Self-pay | Admitting: Nurse Practitioner

## 2018-09-01 ENCOUNTER — Ambulatory Visit: Payer: Self-pay | Admitting: Nurse Practitioner

## 2018-09-01 ENCOUNTER — Ambulatory Visit (INDEPENDENT_AMBULATORY_CARE_PROVIDER_SITE_OTHER): Payer: Medicare Other | Admitting: Nurse Practitioner

## 2018-09-01 ENCOUNTER — Other Ambulatory Visit: Payer: Self-pay

## 2018-09-01 VITALS — BP 132/78 | HR 84 | Resp 16 | Ht 68.0 in | Wt 186.0 lb

## 2018-09-01 DIAGNOSIS — Z0001 Encounter for general adult medical examination with abnormal findings: Secondary | ICD-10-CM

## 2018-09-01 DIAGNOSIS — Z23 Encounter for immunization: Secondary | ICD-10-CM | POA: Diagnosis not present

## 2018-09-01 DIAGNOSIS — J449 Chronic obstructive pulmonary disease, unspecified: Secondary | ICD-10-CM | POA: Diagnosis not present

## 2018-09-01 DIAGNOSIS — R300 Dysuria: Secondary | ICD-10-CM

## 2018-09-01 DIAGNOSIS — I10 Essential (primary) hypertension: Secondary | ICD-10-CM

## 2018-09-01 MED ORDER — PNEUMOCOCCAL VAC POLYVALENT 25 MCG/0.5ML IJ INJ: INJECTION | INTRAMUSCULAR | 0 refills | Status: DC

## 2018-09-01 NOTE — Progress Notes (Signed)
[facility], [place] [address]  Internal MEDICINE  Office Visit Note  Patient Name: ROSALEEN MAZER  [id number]  [id number]  Date of Service: 09/18/2018   Pt is here for routine health maintenance examination  Chief Complaint  Patient presents with  . Annual Exam  . Hypertension  . Depression  . Anxiety     The patient is here for annual health maintennce exam. The patient has no concerns or complaints. She had routine, fasting labs done earlier this month, results were normal. Her screening mammogram was 03/2018 and was benign.     Current Medication: Outpatient Encounter Medications as of 09/01/2018  Medication Sig  . albuterol (PROVENTIL HFA) 108 (90 Base) MCG/ACT inhaler Inhale 2 puffs into the lungs every 4 (four) hours as needed for wheezing or shortness of breath.  Marland Kitchen amLODipine (NORVASC) 2.5 MG tablet Take one tablet by mouth daily.  . bisoprolol-hydrochlorothiazide (ZIAC) 2.5-6.25 MG tablet Take 1 tablet po qd for blood pressure  . escitalopram (LEXAPRO) 20 MG tablet Take one tablet by mouth daily  . pantoprazole (PROTONIX) 40 MG tablet Take 1 tablet two times daily  . pneumococcal 23 valent vaccine (PNEUMOVAX 23) 25 MCG/0.5ML injection Inject 0.39ml IM once  . [DISCONTINUED] sulfamethoxazole-trimethoprim (BACTRIM DS,SEPTRA DS) 800-160 MG tablet Take 1 tablet by mouth 2 (two) times daily.   No facility-administered encounter medications on file as of 09/01/2018.     Surgical History: Past Surgical History:  Procedure Laterality Date  . ABSCESS DRAINAGE  11/17/13   buttock right  . BREAST BIOPSY Right    neg  . CHOLECYSTECTOMY  2008  . COLONOSCOPY  2014   [facility] in [place]  . COLONOSCOPY WITH PROPOFOL N/A 08/26/2015   Procedure: COLONOSCOPY WITH PROPOFOL;  Surgeon: Hulen Luster, MD;  Location: [facility] ENDOSCOPY;  Service: Gastroenterology;  Laterality: N/A;  . ESOPHAGOGASTRODUODENOSCOPY N/A 11/15/2014   Procedure:  ESOPHAGOGASTRODUODENOSCOPY (EGD);  Surgeon: Hulen Luster, MD;  Location: [facility] ENDOSCOPY;  Service: Gastroenterology;  Laterality: N/A;  . UPPER GI ENDOSCOPY  2014   [facility] in [place] History: Past Medical History:  Diagnosis Date  . Anxiety   . Arthritis   . Atrophic kidney   . Chronic constipation   . Colon polyp 2013  . COPD (chronic obstructive pulmonary disease) ([place])   . Depression   . Diverticulosis   . Esophageal stricture   . Esophagitis   . Fibrocystic breast disease   . GERD (gastroesophageal reflux disease)   . History of hiatal hernia   . Hypertension   . IBS (irritable bowel syndrome)   . Menopause   . Ovarian cyst   . Plantar fasciitis   . Postmenopausal   . Renal artery occlusion ([place])   . Renal insufficiency    Right kidney is small and not functioning.    Family History: Family History  Problem Relation Age of Onset  . Heart disease Mother   . Heart attack Mother   . Diabetes Mother   . Alzheimer's disease Father   . Breast cancer Sister 64  . Lung cancer Brother   . Brain cancer Brother   . Lung cancer Sister   . Bladder Cancer Sister   . Lung cancer Brother   . Leukemia Brother   . Diabetes Brother       Review of Systems  Constitutional: Positive for fatigue. Negative for chills and unexpected weight change.  HENT: Negative for congestion, postnasal drip,  rhinorrhea, sinus pressure, sneezing and sore throat.   Respiratory: Negative for cough, chest tightness, shortness of breath and wheezing.   Cardiovascular: Negative for chest pain and palpitations.       Mildly elevated blood pressure today  Gastrointestinal: Negative for abdominal pain, constipation, diarrhea, nausea and vomiting.  Endocrine: Negative for cold intolerance, heat intolerance, polydipsia and polyuria.  Genitourinary: Negative for dysuria and frequency.  Musculoskeletal: Negative for arthralgias, back pain, joint swelling and neck pain.  Skin:  Negative for rash.  Allergic/Immunologic: Positive for environmental allergies.  Neurological: Positive for headaches. Negative for tremors and numbness.  Hematological: Negative for adenopathy. Does not bruise/bleed easily.  Psychiatric/Behavioral: Positive for dysphoric mood. Negative for behavioral problems (Depression), sleep disturbance and suicidal ideas. The patient is nervous/anxious.        Increased depression to recent death of her sister.      Today's Vitals   09/01/18 0906  BP: 132/78  Pulse: 84  Resp: 16  SpO2: 95%  Weight: 186 lb (84.4 kg)  Height: 5\' 8"  (1.727 m)   Body mass index is 28.28 kg/m.  Physical Exam Vitals signs and nursing note reviewed.  Constitutional:      General: She is not in acute distress.    Appearance: Normal appearance. She is well-developed. She is not diaphoretic.  HENT:     Head: Normocephalic and atraumatic.     Right Ear: External ear normal.     Left Ear: External ear normal.     Nose:     Right Sinus: Frontal sinus tenderness present.     Left Sinus: Frontal sinus tenderness present.     Mouth/Throat:     Pharynx: No oropharyngeal exudate.  Eyes:     Pupils: Pupils are equal, round, and reactive to light.  Neck:     Musculoskeletal: Normal range of motion and neck supple.     Thyroid: No thyromegaly.     Vascular: No carotid bruit or JVD.     Trachea: No tracheal deviation.  Cardiovascular:     Rate and Rhythm: Normal rate and regular rhythm.     Pulses: Normal pulses.     Heart sounds: Normal heart sounds. No murmur. No friction rub. No gallop.   Pulmonary:     Effort: Pulmonary effort is normal. No respiratory distress.     Breath sounds: Normal breath sounds. No wheezing or rales.  Chest:     Chest wall: No tenderness.     Breasts:        Right: Normal. No swelling, bleeding, inverted nipple, mass, nipple discharge, skin change or tenderness.        Left: Normal. No swelling, bleeding, inverted nipple, mass, nipple  discharge, skin change or tenderness.  Abdominal:     General: Bowel sounds are normal.     Palpations: Abdomen is soft.     Tenderness: There is no abdominal tenderness.  Musculoskeletal: Normal range of motion.  Lymphadenopathy:     Cervical: No cervical adenopathy.  Skin:    General: Skin is warm and dry.  Neurological:     General: No focal deficit present.     Mental Status: She is alert and oriented to person, place, and time.     Cranial Nerves: No cranial nerve deficit.  Psychiatric:        Behavior: Behavior normal.        Thought Content: Thought content normal.        Judgment: Judgment normal.    Depression  screen [facility] 2/9 09/01/2018 03/03/2018 11/29/2017 08/24/2017 05/24/2017  Decreased Interest 0 1 0 0 0  Down, Depressed, Hopeless 0 0 0 0 0  PHQ - 2 Score 0 1 0 0 0    Functional Status Survey: Is the patient deaf or have difficulty hearing?: No Does the patient have difficulty seeing, even when wearing glasses/contacts?: No Does the patient have difficulty concentrating, remembering, or making decisions?: No Does the patient have difficulty walking or climbing stairs?: No Does the patient have difficulty dressing or bathing?: No Does the patient have difficulty doing errands alone such as visiting a doctor's office or shopping?: No  MMSE - [place] Exam 09/01/2018 08/24/2017  Orientation to time 5 5  Orientation to Place 5 5  Registration 3 3  Attention/ Calculation 5 5  Recall 3 3  Language- name 2 objects 2 2  Language- repeat 1 1  Language- follow 3 step command 3 3  Language- read & follow direction 1 1  Write a sentence 1 1  Copy design 1 1  Total score 30 30    Fall Risk  09/01/2018 03/03/2018 11/29/2017 08/24/2017 05/24/2017  Falls in the past year? 0 0 No No No  Number falls in past yr: 0 0 - - -  Injury with Fall? 0 0 - - -      LABS: Recent Results (from the past 2160 hour(s))  CBC with Differential/Platelet     Status: None   Collection  Time: 08/22/18  8:06 AM  Result Value Ref Range   WBC 6.5 3.4 - 10.8 x10E3/uL   RBC 5.12 3.77 - 5.28 x10E6/uL   Hemoglobin 14.9 11.1 - 15.9 g/dL   Hematocrit 45.4 34.0 - 46.6 %   MCV 89 79 - 97 fL   MCH 29.1 26.6 - 33.0 pg   MCHC 32.8 31.5 - 35.7 g/dL   RDW 13.5 11.7 - 15.4 %   Platelets 297 150 - 450 x10E3/uL   Neutrophils 59 Not Estab. %   Lymphs 29 Not Estab. %   Monocytes 8 Not Estab. %   Eos 3 Not Estab. %   Basos 1 Not Estab. %   Neutrophils Absolute 3.9 1.4 - 7.0 x10E3/uL   Lymphocytes Absolute 1.9 0.7 - 3.1 x10E3/uL   Monocytes Absolute 0.5 0.1 - 0.9 x10E3/uL   EOS (ABSOLUTE) 0.2 0.0 - 0.4 x10E3/uL   Basophils Absolute 0.1 0.0 - 0.2 x10E3/uL   Immature Granulocytes 0 Not Estab. %   Immature Grans (Abs) 0.0 0.0 - 0.1 x10E3/uL  Comprehensive metabolic panel     Status: Abnormal   Collection Time: 08/22/18  8:06 AM  Result Value Ref Range   Glucose 97 65 - 99 mg/dL   BUN 20 8 - 27 mg/dL   Creatinine, Ser 1.39 (H) 0.57 - 1.00 mg/dL   GFR calc non Af Amer 39 (L) >59 mL/min/1.73   GFR calc Af Amer 45 (L) >59 mL/min/1.73   BUN/Creatinine Ratio 14 12 - 28   Sodium 139 134 - 144 mmol/L   Potassium 4.3 3.5 - 5.2 mmol/L   Chloride 101 96 - 106 mmol/L   CO2 23 20 - 29 mmol/L   Calcium 9.7 8.7 - 10.3 mg/dL   Total Protein 6.5 6.0 - 8.5 g/dL   Albumin 4.3 3.8 - 4.8 g/dL   Globulin, Total 2.2 1.5 - 4.5 g/dL   Albumin/Globulin Ratio 2.0 1.2 - 2.2   Bilirubin Total 0.3 0.0 - 1.2 mg/dL   Alkaline Phosphatase  82 39 - 117 IU/L   AST 18 0 - 40 IU/L   ALT 13 0 - 32 IU/L  Lipid Panel With LDL/HDL Ratio     Status: Abnormal   Collection Time: 08/22/18  8:06 AM  Result Value Ref Range   Cholesterol, Total 216 (H) 100 - 199 mg/dL   Triglycerides 153 (H) 0 - 149 mg/dL   HDL 39 (L) >39 mg/dL   VLDL Cholesterol Cal 31 5 - 40 mg/dL   LDL Calculated 146 (H) 0 - 99 mg/dL   LDl/HDL Ratio 3.7 (H) 0.0 - 3.2 ratio    Comment:                                     LDL/HDL Ratio                                              Men  Women                               1/2 Avg.Risk  1.0    1.5                                   Avg.Risk  3.6    3.2                                2X Avg.Risk  6.2    5.0                                3X Avg.Risk  8.0    6.1   T4, free     Status: None   Collection Time: 08/22/18  8:06 AM  Result Value Ref Range   Free T4 1.31 0.82 - 1.77 ng/dL  TSH     Status: None   Collection Time: 08/22/18  8:06 AM  Result Value Ref Range   TSH 1.830 0.450 - 4.500 uIU/mL  UA/M w/rflx Culture, Routine     Status: Abnormal   Collection Time: 09/01/18  9:14 AM  Result Value Ref Range   Specific Gravity, UA 1.021 1.005 - 1.030   pH, UA 5.0 5.0 - 7.5   Color, UA Yellow Yellow   Appearance Ur Clear Clear   Leukocytes,UA Negative Negative   Protein,UA 1+ (A) Negative/Trace   Glucose, UA Negative Negative   Ketones, UA Trace (A) Negative   RBC, UA Negative Negative   Bilirubin, UA Negative Negative   Urobilinogen, Ur 0.2 0.2 - 1.0 mg/dL   Nitrite, UA Negative Negative   Microscopic Examination See below:     Comment: Microscopic was indicated and was performed.   Urinalysis Reflex Comment     Comment: This specimen will not reflex to a Urine Culture.  Microscopic Examination     Status: Abnormal   Collection Time: 09/01/18  9:14 AM  Result Value Ref Range   WBC, UA 0-5 0 - 5 /hpf   RBC 0-2 0 - 2 /hpf   Epithelial Cells (non renal) 0-10 0 - 10 /hpf  Casts Present (A) None seen /lpf   Cast Type Hyaline casts N/A   Mucus, UA Present Not Estab.   Bacteria, UA Few None seen/Few    Assessment/Plan: 1. Encounter for general adult medical examination with abnormal findings Annual health maintenance exam today.   2. Essential hypertension Stable. Continue bp medication as prescribed.   3. Need for vaccination against Streptococcus pneumoniae using pneumococcal conjugate vaccine 13 Prescription for pneumovax sent to her pharmacy for administration.  -  pneumococcal 23 valent vaccine (PNEUMOVAX 23) 25 MCG/0.5ML injection; Inject 0.30ml IM once  Dispense: 0.5 mL; Refill: 0  4. Dysuria - UA/M w/rflx Culture, Routine  5. Chronic obstructive pulmonary disease, unspecified COPD type ([place]) Continue to use respiratory medication as prescribed   General Counseling: shanasia ibrahim understanding of the findings of todays visit and agrees with plan of treatment. I have discussed any further diagnostic evaluation that may be needed or ordered today. We also reviewed her medications today. she has been encouraged to call the office with any questions or concerns that should arise related to todays visit.    Counseling:  Hypertension Counseling:   The following hypertensive lifestyle modification were recommended and discussed:  1. Limiting alcohol intake to less than 1 oz/day of ethanol:(24 oz of beer or 8 oz of wine or 2 oz of 100-proof whiskey). 2. Take baby ASA 81 mg daily. 3. Importance of regular aerobic exercise and losing weight. 4. Reduce dietary saturated fat and cholesterol intake for overall cardiovascular health. 5. Maintaining adequate dietary potassium, calcium, and magnesium intake. 6. Regular monitoring of the blood pressure. 7. Reduce sodium intake to less than 100 mmol/day (less than 2.3 gm of sodium or less than 6 gm of sodium choride)   This patient was seen by [place] with Dr Lavera Guise as a part of collaborative care agreement  Orders Placed This Encounter  Procedures  . Microscopic Examination  . UA/M w/rflx Culture, Routine    Meds ordered this encounter  Medications  . pneumococcal 23 valent vaccine (PNEUMOVAX 23) 25 MCG/0.5ML injection    Sig: Inject 0.35ml IM once    Dispense:  0.5 mL    Refill:  0    Order Specific Question:   Supervising Provider    Answer:   Lavera Guise [[id number]]    Time spent: [place], MD  Internal Medicine

## 2018-09-02 LAB — MICROSCOPIC EXAMINATION

## 2018-09-02 LAB — UA/M W/RFLX CULTURE, ROUTINE
Bilirubin, UA: NEGATIVE
Glucose, UA: NEGATIVE
Leukocytes,UA: NEGATIVE
Nitrite, UA: NEGATIVE
RBC, UA: NEGATIVE
Specific Gravity, UA: 1.021 (ref 1.005–1.030)
Urobilinogen, Ur: 0.2 mg/dL (ref 0.2–1.0)
pH, UA: 5 (ref 5.0–7.5)

## 2018-09-06 ENCOUNTER — Other Ambulatory Visit: Payer: Self-pay

## 2018-09-06 MED ORDER — PREDNISONE 10 MG (21) PO TBPK: ORAL_TABLET | ORAL | 0 refills | Status: DC

## 2018-09-06 MED ORDER — HYDROXYZINE HCL 25 MG PO TABS: 25.0000 mg | ORAL_TABLET | Freq: Two times a day (BID) | ORAL | 0 refills | Status: DC | PRN

## 2018-09-06 NOTE — Telephone Encounter (Signed)
Pt called that she rash and itching on both arm she was working in yard she took benadryl and used hydrocortisone cream not helping as per dr Humphrey Rolls send prednisone and atrax and also advised pt atarax make her drowsy please don't drive and need follow up in 3 weeks and gave courtney call to make appt

## 2018-09-16 DIAGNOSIS — Z23 Encounter for immunization: Secondary | ICD-10-CM | POA: Diagnosis not present

## 2018-09-18 DIAGNOSIS — J449 Chronic obstructive pulmonary disease, unspecified: Secondary | ICD-10-CM | POA: Insufficient documentation

## 2018-10-03 ENCOUNTER — Other Ambulatory Visit: Payer: Self-pay

## 2018-10-03 ENCOUNTER — Encounter: Payer: Self-pay | Admitting: Nurse Practitioner

## 2018-10-03 ENCOUNTER — Ambulatory Visit (INDEPENDENT_AMBULATORY_CARE_PROVIDER_SITE_OTHER): Payer: Medicare Other | Admitting: Nurse Practitioner

## 2018-10-03 VITALS — BP 137/62 | HR 55 | Resp 16 | Ht 68.0 in | Wt 187.0 lb

## 2018-10-03 DIAGNOSIS — K219 Gastro-esophageal reflux disease without esophagitis: Secondary | ICD-10-CM | POA: Insufficient documentation

## 2018-10-03 DIAGNOSIS — R0602 Shortness of breath: Secondary | ICD-10-CM | POA: Diagnosis not present

## 2018-10-03 DIAGNOSIS — F411 Generalized anxiety disorder: Secondary | ICD-10-CM | POA: Insufficient documentation

## 2018-10-03 DIAGNOSIS — I10 Essential (primary) hypertension: Secondary | ICD-10-CM | POA: Diagnosis not present

## 2018-10-03 MED ORDER — AMLODIPINE BESYLATE 2.5 MG PO TABS: ORAL_TABLET | ORAL | 1 refills | Status: DC

## 2018-10-03 MED ORDER — BUSPIRONE HCL 5 MG PO TABS: ORAL_TABLET | ORAL | 3 refills | Status: DC

## 2018-10-03 MED ORDER — ALBUTEROL SULFATE HFA 108 (90 BASE) MCG/ACT IN AERS: 2.0000 | INHALATION_SPRAY | RESPIRATORY_TRACT | 1 refills | Status: DC | PRN

## 2018-10-03 MED ORDER — BISOPROLOL-HYDROCHLOROTHIAZIDE 2.5-6.25 MG PO TABS: ORAL_TABLET | ORAL | 1 refills | Status: DC

## 2018-10-03 MED ORDER — PANTOPRAZOLE SODIUM 40 MG PO TBEC: DELAYED_RELEASE_TABLET | ORAL | 1 refills | Status: DC

## 2018-10-03 MED ORDER — ESCITALOPRAM OXALATE 20 MG PO TABS: ORAL_TABLET | ORAL | 1 refills | Status: DC

## 2018-10-03 NOTE — Progress Notes (Signed)
[facility] [place], [place] [address]  Internal MEDICINE  Office Visit Note  Patient Name: Shannon Li  [id number]  [id number]  Date of Service: 10/03/2018  Chief Complaint  Patient presents with  . Follow-up    The patient is here for follow up visit. She states she is afraid that the lexapro she takes might not be working as well as it used to. Finds that she gets very irritable and upset much more easily than she used to. Has many days when this has happening. She states that once she gets it out, that she returns back to the normal mood. Was treated for allergic reaction or bug bite of some sort last week. Took prednisone taper and took hydroxyzine at night. States that this has helped a great deal. Has resolved.       Current Medication: Outpatient Encounter Medications as of 10/03/2018  Medication Sig  . albuterol (PROVENTIL HFA) 108 (90 Base) MCG/ACT inhaler Inhale 2 puffs into the lungs every 4 (four) hours as needed for wheezing or shortness of breath.  Marland Kitchen amLODipine (NORVASC) 2.5 MG tablet Take one tablet by mouth daily.  . bisoprolol-hydrochlorothiazide (ZIAC) 2.5-6.25 MG tablet Take 1 tablet po qd for blood pressure  . escitalopram (LEXAPRO) 20 MG tablet Take one tablet by mouth daily  . hydrOXYzine (ATARAX/VISTARIL) 25 MG tablet Take 1 tablet (25 mg total) by mouth 2 (two) times daily as needed.  . pantoprazole (PROTONIX) 40 MG tablet Take 1 tablet two times daily  . pneumococcal 23 valent vaccine (PNEUMOVAX 23) 25 MCG/0.5ML injection Inject 0.53ml IM once  . predniSONE (STERAPRED UNI-PAK 21 TAB) 10 MG (21) TBPK tablet Use as directed for 6 days taper  . [DISCONTINUED] albuterol (PROVENTIL HFA) 108 (90 Base) MCG/ACT inhaler Inhale 2 puffs into the lungs every 4 (four) hours as needed for wheezing or shortness of breath.  . [DISCONTINUED] amLODipine (NORVASC) 2.5 MG tablet Take one tablet by mouth daily.  . [DISCONTINUED]  bisoprolol-hydrochlorothiazide (ZIAC) 2.5-6.25 MG tablet Take 1 tablet po qd for blood pressure  . [DISCONTINUED] escitalopram (LEXAPRO) 20 MG tablet Take one tablet by mouth daily  . [DISCONTINUED] pantoprazole (PROTONIX) 40 MG tablet Take 1 tablet two times daily  . busPIRone (BUSPAR) 5 MG tablet Take 1 tablet po two to three times per day prn anxiety   No facility-administered encounter medications on file as of 10/03/2018.     Surgical History: Past Surgical History:  Procedure Laterality Date  . ABSCESS DRAINAGE  11/17/13   buttock right  . BREAST BIOPSY Right    neg  . CHOLECYSTECTOMY  2008  . COLONOSCOPY  2014   [facility] [place]  . COLONOSCOPY WITH PROPOFOL N/A 08/26/2015   Procedure: COLONOSCOPY WITH PROPOFOL;  Surgeon: Hulen Luster, MD;  Location: [facility] ENDOSCOPY;  Service: Gastroenterology;  Laterality: N/A;  . ESOPHAGOGASTRODUODENOSCOPY N/A 11/15/2014   Procedure: ESOPHAGOGASTRODUODENOSCOPY (EGD);  Surgeon: Hulen Luster, MD;  Location: [facility] ENDOSCOPY;  Service: Gastroenterology;  Laterality: N/A;  . UPPER GI ENDOSCOPY  2014   [facility] in [place] History: Past Medical History:  Diagnosis Date  . Anxiety   . Arthritis   . Atrophic kidney   . Chronic constipation   . Colon polyp 2013  . COPD (chronic obstructive pulmonary disease) ([place])   . Depression   . Diverticulosis   . Esophageal stricture   . Esophagitis   . Fibrocystic breast disease   . GERD (gastroesophageal  reflux disease)   . History of hiatal hernia   . Hypertension   . IBS (irritable bowel syndrome)   . Menopause   . Ovarian cyst   . Plantar fasciitis   . Postmenopausal   . Renal artery occlusion ([place])   . Renal insufficiency    Right kidney is small and not functioning.    Family History: Family History  Problem Relation Age of Onset  . Heart disease Mother   . Heart attack Mother   . Diabetes Mother   . Alzheimer's disease Father   . Breast cancer Sister 11  .  Lung cancer Brother   . Brain cancer Brother   . Lung cancer Sister   . Bladder Cancer Sister   . Lung cancer Brother   . Leukemia Brother   . Diabetes Brother     Social History   Socioeconomic History  . Marital status: Widowed    Spouse name: Not on file  . Number of children: Not on file  . Years of education: Not on file  . Highest education level: Not on file  Occupational History  . Not on file  Social Needs  . Financial resource strain: Not on file  . Food insecurity    Worry: Not on file    Inability: Not on file  . Transportation needs    Medical: Not on file    Non-medical: Not on file  Tobacco Use  . Smoking status: Current Every Day Smoker    Packs/day: 1.00    Years: 30.00    Pack years: 30.00    Types: Cigarettes  . Smokeless tobacco: Never Used  Substance and Sexual Activity  . Alcohol use: No  . Drug use: No  . Sexual activity: Not on file  Lifestyle  . Physical activity    Days per week: Not on file    Minutes per session: Not on file  . Stress: Not on file  Relationships  . Social Herbalist on phone: Not on file    Gets together: Not on file    Attends religious service: Not on file    Active member of club or organization: Not on file    Attends meetings of clubs or organizations: Not on file    Relationship status: Not on file  . Intimate partner violence    Fear of current or ex partner: Not on file    Emotionally abused: Not on file    Physically abused: Not on file    Forced sexual activity: Not on file  Other Topics Concern  . Not on file  Social History Narrative  . Not on file      Review of Systems  Constitutional: Positive for fatigue. Negative for chills and unexpected weight change.  HENT: Negative for congestion, postnasal drip, rhinorrhea, sinus pressure, sneezing and sore throat.   Respiratory: Negative for cough, chest tightness, shortness of breath and wheezing.   Cardiovascular: Negative for chest pain  and palpitations.  Gastrointestinal: Negative for abdominal pain, constipation, diarrhea, nausea and vomiting.  Endocrine: Negative for cold intolerance, heat intolerance, polydipsia and polyuria.  Musculoskeletal: Positive for back pain. Negative for arthralgias, joint swelling and neck pain.  Skin: Negative for rash.  Allergic/Immunologic: Positive for environmental allergies.  Neurological: Positive for headaches. Negative for tremors and numbness.  Hematological: Negative for adenopathy. Does not bruise/bleed easily.  Psychiatric/Behavioral: Positive for dysphoric mood. Negative for behavioral problems (Depression), sleep disturbance and suicidal ideas. The patient is  nervous/anxious.        Has continues to have increased anxiety and irritability. Worries about everything. Gets upset very easily.     Today's Vitals   10/03/18 0841  BP: 137/62  Pulse: (!) 55  Resp: 16  SpO2: 96%  Weight: 187 lb (84.8 kg)  Height: 5\' 8"  (1.727 m)   Body mass index is 28.43 kg/m.  Physical Exam Vitals signs and nursing note reviewed.  Constitutional:      General: She is not in acute distress.    Appearance: Normal appearance. She is well-developed. She is not diaphoretic.  HENT:     Head: Normocephalic and atraumatic.     Mouth/Throat:     Pharynx: No oropharyngeal exudate.  Eyes:     Pupils: Pupils are equal, round, and reactive to light.  Neck:     Musculoskeletal: Normal range of motion and neck supple.     Thyroid: No thyromegaly.     Vascular: No carotid bruit or JVD.     Trachea: No tracheal deviation.  Cardiovascular:     Rate and Rhythm: Normal rate and regular rhythm.     Heart sounds: Normal heart sounds. No murmur. No friction rub. No gallop.   Pulmonary:     Effort: Pulmonary effort is normal. No respiratory distress.     Breath sounds: Normal breath sounds. No wheezing or rales.  Chest:     Chest wall: No tenderness.  Abdominal:     Palpations: Abdomen is soft.      Tenderness: There is no abdominal tenderness. There is no guarding.  Musculoskeletal: Normal range of motion.  Lymphadenopathy:     Cervical: No cervical adenopathy.  Skin:    General: Skin is warm and dry.  Neurological:     Mental Status: She is alert and oriented to person, place, and time.     Cranial Nerves: No cranial nerve deficit.  Psychiatric:        Attention and Perception: Attention and perception normal.        Mood and Affect: Mood is anxious and depressed.        Speech: Speech normal.        Behavior: Behavior normal. Behavior is cooperative.        Thought Content: Thought content normal.        Cognition and Memory: Cognition and memory normal.        Judgment: Judgment normal.    Assessment/Plan: 1. Essential hypertension Stable. Continue bp medication as prescribed. Refills provided today.  - amLODipine (NORVASC) 2.5 MG tablet; Take one tablet by mouth daily.  Dispense: 90 tablet; Refill: 1 - bisoprolol-hydrochlorothiazide (ZIAC) 2.5-6.25 MG tablet; Take 1 tablet po qd for blood pressure  Dispense: 180 tablet; Refill: 1  2. Shortness of breath May use rescue inhaler as needed and as prescribed. Refills provided today.  - albuterol (PROVENTIL HFA) 108 (90 Base) MCG/ACT inhaler; Inhale 2 puffs into the lungs every 4 (four) hours as needed for wheezing or shortness of breath.  Dispense: 3 Inhaler; Refill: 1  3. Gastroesophageal reflux disease without esophagitis Continue protonix 40mg  daily. Refills provided today.  - pantoprazole (PROTONIX) 40 MG tablet; Take 1 tablet two times daily  Dispense: 180 tablet; Refill: 1  4. Generalized anxiety disorder Continue lexapro 20mg  daily. Start buspirone 5mg  which may be taken two to three times per day as needed for acute anxiety. Advised she takes this when she feels emotions starting to build up, not to wait until severe.  She voiced understanding.  - busPIRone (BUSPAR) 5 MG tablet; Take 1 tablet po two to three times per  day prn anxiety  Dispense: 90 tablet; Refill: 3 - escitalopram (LEXAPRO) 20 MG tablet; Take one tablet by mouth daily  Dispense: 90 tablet; Refill: 1 General Counseling: annalese stiner understanding of the findings of todays visit and agrees with plan of treatment. I have discussed any further diagnostic evaluation that may be needed or ordered today. We also reviewed her medications today. she has been encouraged to call the office with any questions or concerns that should arise related to todays visit.   Hypertension Counseling:   The following hypertensive lifestyle modification were recommended and discussed:  1. Limiting alcohol intake to less than 1 oz/day of ethanol:(24 oz of beer or 8 oz of wine or 2 oz of 100-proof whiskey). 2. Take baby ASA 81 mg daily. 3. Importance of regular aerobic exercise and losing weight. 4. Reduce dietary saturated fat and cholesterol intake for overall cardiovascular health. 5. Maintaining adequate dietary potassium, calcium, and magnesium intake. 6. Regular monitoring of the blood pressure. 7. Reduce sodium intake to less than 100 mmol/day (less than 2.3 gm of sodium or less than 6 gm of sodium choride)   This patient was seen by [place] with Dr Lavera Guise as a part of collaborative care agreement  Meds ordered this encounter  Medications  . busPIRone (BUSPAR) 5 MG tablet    Sig: Take 1 tablet po two to three times per day prn anxiety    Dispense:  90 tablet    Refill:  3    Order Specific Question:   Supervising Provider    Answer:   Lavera Guise [place]  . albuterol (PROVENTIL HFA) 108 (90 Base) MCG/ACT inhaler    Sig: Inhale 2 puffs into the lungs every 4 (four) hours as needed for wheezing or shortness of breath.    Dispense:  3 Inhaler    Refill:  1    Order Specific Question:   Supervising Provider    Answer:   Lavera Guise [[id number]]  . amLODipine (NORVASC) 2.5 MG tablet    Sig: Take one tablet by mouth daily.     Dispense:  90 tablet    Refill:  1    Order Specific Question:   Supervising Provider    Answer:   Lavera Guise [[id number]]  . bisoprolol-hydrochlorothiazide (ZIAC) 2.5-6.25 MG tablet    Sig: Take 1 tablet po qd for blood pressure    Dispense:  180 tablet    Refill:  1    Update prescription instructions.    Order Specific Question:   Supervising Provider    Answer:   Lavera Guise [[id number]]  . escitalopram (LEXAPRO) 20 MG tablet    Sig: Take one tablet by mouth daily    Dispense:  90 tablet    Refill:  1    Order Specific Question:   Supervising Provider    Answer:   Lavera Guise [[id number]]  . pantoprazole (PROTONIX) 40 MG tablet    Sig: Take 1 tablet two times daily    Dispense:  180 tablet    Refill:  1    Order Specific Question:   Supervising Provider    Answer:   Lavera Guise [[id number]]    Time spent: 60 Minutes      Dr Lavera Guise Internal medicine

## 2018-11-30 DIAGNOSIS — L538 Other specified erythematous conditions: Secondary | ICD-10-CM | POA: Diagnosis not present

## 2018-11-30 DIAGNOSIS — L820 Inflamed seborrheic keratosis: Secondary | ICD-10-CM | POA: Diagnosis not present

## 2018-11-30 DIAGNOSIS — R208 Other disturbances of skin sensation: Secondary | ICD-10-CM | POA: Diagnosis not present

## 2018-11-30 DIAGNOSIS — D0462 Carcinoma in situ of skin of left upper limb, including shoulder: Secondary | ICD-10-CM | POA: Diagnosis not present

## 2019-01-05 ENCOUNTER — Encounter: Payer: Self-pay | Admitting: Nurse Practitioner

## 2019-01-05 ENCOUNTER — Other Ambulatory Visit: Payer: Self-pay

## 2019-01-05 ENCOUNTER — Ambulatory Visit (INDEPENDENT_AMBULATORY_CARE_PROVIDER_SITE_OTHER): Payer: Medicare Other | Admitting: Nurse Practitioner

## 2019-01-05 VITALS — BP 150/85 | HR 56 | Temp 98.4°F | Resp 16 | Ht 68.0 in | Wt 186.0 lb

## 2019-01-05 DIAGNOSIS — I10 Essential (primary) hypertension: Secondary | ICD-10-CM

## 2019-01-05 DIAGNOSIS — K219 Gastro-esophageal reflux disease without esophagitis: Secondary | ICD-10-CM | POA: Diagnosis not present

## 2019-01-05 DIAGNOSIS — F411 Generalized anxiety disorder: Secondary | ICD-10-CM | POA: Diagnosis not present

## 2019-01-05 NOTE — Progress Notes (Signed)
[facility] [place], [place] [address]  Internal MEDICINE  Office Visit Note  Patient Name: Shannon Li  [id number]  [id number]  Date of Service: 01/15/2019  Chief Complaint  Patient presents with  . Medical Management of Chronic Issues    4 month follow up   . Hypertension  . Anxiety    The patient is here for follow up visit. She was started on buspirone 5mg  as needed for acute anxiety. She states that she really has not noted that this helped. Has not noted any negative side effects. She states that she has only taken this a few times, afraid to take it more often. Does not like to take medication. Does take lexapro daily. For the most part, controls anxiety pretty well, but gets irritated very easily.  She has intermittent upper abdominal pain. This is off and on and has been going on for some time. UGI done back in 2006 did show hiatal hernia with GERD. CT abdomen done 2019 did not show anything acute. She does have concern for worsening hernia or ulceration. Discussed getting new UGI with patient. She wishes to hold off for now. May reconsider after spread of COVID 19 declines.  She does have intermittent back pain. Has arthritis in lumbar spine. Will take aleve as needed for pain. This does not make belly pain worse when she takes it.       Current Medication: Outpatient Encounter Medications as of 01/05/2019  Medication Sig  . albuterol (PROVENTIL HFA) 108 (90 Base) MCG/ACT inhaler Inhale 2 puffs into the lungs every 4 (four) hours as needed for wheezing or shortness of breath.  Marland Kitchen amLODipine (NORVASC) 2.5 MG tablet Take one tablet by mouth daily.  . bisoprolol-hydrochlorothiazide (ZIAC) 2.5-6.25 MG tablet Take 1 tablet po qd for blood pressure  . busPIRone (BUSPAR) 5 MG tablet Take 1 tablet po two to three times per day prn anxiety  . escitalopram (LEXAPRO) 20 MG tablet Take one tablet by mouth daily  . pantoprazole (PROTONIX) 40 MG tablet Take  1 tablet two times daily  . [DISCONTINUED] albuterol (PROVENTIL HFA) 108 (90 Base) MCG/ACT inhaler Inhale 2 puffs into the lungs every 4 (four) hours as needed for wheezing or shortness of breath.  . [DISCONTINUED] amLODipine (NORVASC) 2.5 MG tablet Take one tablet by mouth daily.  . [DISCONTINUED] bisoprolol-hydrochlorothiazide (ZIAC) 2.5-6.25 MG tablet Take 1 tablet po qd for blood pressure  . [DISCONTINUED] escitalopram (LEXAPRO) 20 MG tablet Take one tablet by mouth daily  . [DISCONTINUED] hydrOXYzine (ATARAX/VISTARIL) 25 MG tablet Take 1 tablet (25 mg total) by mouth 2 (two) times daily as needed. (Patient not taking: Reported on 01/05/2019)  . [DISCONTINUED] pantoprazole (PROTONIX) 40 MG tablet Take 1 tablet two times daily  . [DISCONTINUED] pneumococcal 23 valent vaccine (PNEUMOVAX 23) 25 MCG/0.5ML injection Inject 0.32ml IM once (Patient not taking: Reported on 01/05/2019)  . [DISCONTINUED] predniSONE (STERAPRED UNI-PAK 21 TAB) 10 MG (21) TBPK tablet Use as directed for 6 days taper (Patient not taking: Reported on 01/05/2019)   No facility-administered encounter medications on file as of 01/05/2019.     Surgical History: Past Surgical History:  Procedure Laterality Date  . ABSCESS DRAINAGE  11/17/13   buttock right  . BREAST BIOPSY Right    neg  . CHOLECYSTECTOMY  2008  . COLONOSCOPY  2014   [facility] [place]  . COLONOSCOPY WITH PROPOFOL N/A 08/26/2015   Procedure: COLONOSCOPY WITH PROPOFOL;  Surgeon: Hulen Luster, MD;  Location: [facility] ENDOSCOPY;  Service: Gastroenterology;  Laterality: N/A;  . ESOPHAGOGASTRODUODENOSCOPY N/A 11/15/2014   Procedure: ESOPHAGOGASTRODUODENOSCOPY (EGD);  Surgeon: Hulen Luster, MD;  Location: [facility] ENDOSCOPY;  Service: Gastroenterology;  Laterality: N/A;  . UPPER GI ENDOSCOPY  2014   [facility] [place] History: Past Medical History:  Diagnosis Date  . Anxiety   . Arthritis   . Atrophic kidney   . Chronic constipation   .  Colon polyp 2013  . COPD (chronic obstructive pulmonary disease) ([place])   . Depression   . Diverticulosis   . Esophageal stricture   . Esophagitis   . Fibrocystic breast disease   . GERD (gastroesophageal reflux disease)   . History of hiatal hernia   . Hypertension   . IBS (irritable bowel syndrome)   . Menopause   . Ovarian cyst   . Plantar fasciitis   . Postmenopausal   . Renal artery occlusion ([place])   . Renal insufficiency    Right kidney is small and not functioning.    Family History: Family History  Problem Relation Age of Onset  . Heart disease Mother   . Heart attack Mother   . Diabetes Mother   . Alzheimer's disease Father   . Breast cancer Sister 95  . Lung cancer Brother   . Brain cancer Brother   . Lung cancer Sister   . Bladder Cancer Sister   . Lung cancer Brother   . Leukemia Brother   . Diabetes Brother     Social History   Socioeconomic History  . Marital status: Widowed    Spouse name: Not on file  . Number of children: Not on file  . Years of education: Not on file  . Highest education level: Not on file  Occupational History  . Not on file  Social Needs  . Financial resource strain: Not on file  . Food insecurity    Worry: Not on file    Inability: Not on file  . Transportation needs    Medical: Not on file    Non-medical: Not on file  Tobacco Use  . Smoking status: Current Every Day Smoker    Packs/day: 1.00    Years: 30.00    Pack years: 30.00    Types: Cigarettes  . Smokeless tobacco: Never Used  Substance and Sexual Activity  . Alcohol use: No  . Drug use: No  . Sexual activity: Not on file  Lifestyle  . Physical activity    Days per week: Not on file    Minutes per session: Not on file  . Stress: Not on file  Relationships  . Social Herbalist on phone: Not on file    Gets together: Not on file    Attends religious service: Not on file    Active member of club or organization: Not on file    Attends  meetings of clubs or organizations: Not on file    Relationship status: Not on file  . Intimate partner violence    Fear of current or ex partner: Not on file    Emotionally abused: Not on file    Physically abused: Not on file    Forced sexual activity: Not on file  Other Topics Concern  . Not on file  Social History Narrative  . Not on file      Review of Systems  Constitutional: Positive for fatigue. Negative for chills and unexpected weight change.  HENT: Negative  for congestion, postnasal drip, rhinorrhea, sinus pressure, sneezing and sore throat.   Respiratory: Negative for cough, chest tightness, shortness of breath and wheezing.   Cardiovascular: Negative for chest pain and palpitations.  Gastrointestinal: Negative for abdominal pain, constipation, diarrhea, nausea and vomiting.  Endocrine: Negative for cold intolerance, heat intolerance, polydipsia and polyuria.  Musculoskeletal: Positive for back pain. Negative for arthralgias, joint swelling and neck pain.  Skin: Negative for rash.  Allergic/Immunologic: Positive for environmental allergies.  Neurological: Positive for headaches. Negative for tremors and numbness.  Hematological: Negative for adenopathy. Does not bruise/bleed easily.  Psychiatric/Behavioral: Positive for dysphoric mood. Negative for behavioral problems (Depression), sleep disturbance and suicidal ideas. The patient is nervous/anxious.        Anxiety and irritability have improved ome since her most recent visit when buspirone was added.     Today's Vitals   01/05/19 1341  BP: (!) 150/85  Pulse: (!) 56  Resp: 16  Temp: 98.4 F (36.9 C)  SpO2: 96%  Weight: 186 lb (84.4 kg)  Height: 5\' 8"  (1.727 m)   Body mass index is 28.28 kg/m.  Physical Exam Vitals signs and nursing note reviewed.  Constitutional:      General: She is not in acute distress.    Appearance: Normal appearance. She is well-developed. She is not diaphoretic.  HENT:     Head:  Normocephalic and atraumatic.     Mouth/Throat:     Pharynx: No oropharyngeal exudate.  Eyes:     Pupils: Pupils are equal, round, and reactive to light.  Neck:     Musculoskeletal: Normal range of motion and neck supple.     Thyroid: No thyromegaly.     Vascular: No carotid bruit or JVD.     Trachea: No tracheal deviation.  Cardiovascular:     Rate and Rhythm: Normal rate and regular rhythm.     Heart sounds: Normal heart sounds. No murmur. No friction rub. No gallop.   Pulmonary:     Effort: Pulmonary effort is normal. No respiratory distress.     Breath sounds: Normal breath sounds. No wheezing or rales.  Chest:     Chest wall: No tenderness.  Abdominal:     Palpations: Abdomen is soft.     Tenderness: There is no abdominal tenderness. There is no guarding.  Musculoskeletal: Normal range of motion.  Lymphadenopathy:     Cervical: No cervical adenopathy.  Skin:    General: Skin is warm and dry.  Neurological:     Mental Status: She is alert and oriented to person, place, and time.     Cranial Nerves: No cranial nerve deficit.  Psychiatric:        Attention and Perception: Attention and perception normal.        Mood and Affect: Mood is anxious and depressed.        Speech: Speech normal.        Behavior: Behavior normal. Behavior is cooperative.        Thought Content: Thought content normal.        Cognition and Memory: Cognition and memory normal.        Judgment: Judgment normal.    Assessment/Plan: 1. Essential hypertension Blood pressure generally well controlled. Continue meds as prescribed   2. Gastroesophageal reflux disease without esophagitis Continue pantoprazole as prescribed   3. Generalized anxiety disorder Advised she take buspirone at onset of increase anxiety and irritability building up. May take one to two tablets two to three times per  day as needed. Continue with lexapro as prescribed.   General Counseling: venissa jasa understanding of  the findings of todays visit and agrees with plan of treatment. I have discussed any further diagnostic evaluation that may be needed or ordered today. We also reviewed her medications today. she has been encouraged to call the office with any questions or concerns that should arise related to todays visit.   Hypertension Counseling:   The following hypertensive lifestyle modification were recommended and discussed:  1. Limiting alcohol intake to less than 1 oz/day of ethanol:(24 oz of beer or 8 oz of wine or 2 oz of 100-proof whiskey). 2. Take baby ASA 81 mg daily. 3. Importance of regular aerobic exercise and losing weight. 4. Reduce dietary saturated fat and cholesterol intake for overall cardiovascular health. 5. Maintaining adequate dietary potassium, calcium, and magnesium intake. 6. Regular monitoring of the blood pressure. 7. Reduce sodium intake to less than 100 mmol/day (less than 2.3 gm of sodium or less than 6 gm of sodium choride)   This patient was seen by [place] with Dr Lavera Guise as a part of collaborative care agreement  Time spent: 25 Minutes      Dr Lavera Guise Internal medicine

## 2019-01-23 ENCOUNTER — Other Ambulatory Visit: Payer: Self-pay

## 2019-01-23 DIAGNOSIS — F411 Generalized anxiety disorder: Secondary | ICD-10-CM

## 2019-01-23 MED ORDER — BUSPIRONE HCL 5 MG PO TABS: ORAL_TABLET | ORAL | 3 refills | Status: DC

## 2019-02-17 DIAGNOSIS — Z23 Encounter for immunization: Secondary | ICD-10-CM | POA: Diagnosis not present

## 2019-03-07 ENCOUNTER — Ambulatory Visit: Payer: Medicare Other | Admitting: Nurse Practitioner

## 2019-03-07 DIAGNOSIS — L570 Actinic keratosis: Secondary | ICD-10-CM | POA: Diagnosis not present

## 2019-03-07 DIAGNOSIS — X32XXXA Exposure to sunlight, initial encounter: Secondary | ICD-10-CM | POA: Diagnosis not present

## 2019-03-07 DIAGNOSIS — D2261 Melanocytic nevi of right upper limb, including shoulder: Secondary | ICD-10-CM | POA: Diagnosis not present

## 2019-03-07 DIAGNOSIS — D485 Neoplasm of uncertain behavior of skin: Secondary | ICD-10-CM | POA: Diagnosis not present

## 2019-03-07 DIAGNOSIS — D2262 Melanocytic nevi of left upper limb, including shoulder: Secondary | ICD-10-CM | POA: Diagnosis not present

## 2019-03-07 DIAGNOSIS — D2272 Melanocytic nevi of left lower limb, including hip: Secondary | ICD-10-CM | POA: Diagnosis not present

## 2019-03-07 DIAGNOSIS — Z85828 Personal history of other malignant neoplasm of skin: Secondary | ICD-10-CM | POA: Diagnosis not present

## 2019-03-07 DIAGNOSIS — L821 Other seborrheic keratosis: Secondary | ICD-10-CM | POA: Diagnosis not present

## 2019-03-07 DIAGNOSIS — C44622 Squamous cell carcinoma of skin of right upper limb, including shoulder: Secondary | ICD-10-CM | POA: Diagnosis not present

## 2019-04-04 ENCOUNTER — Other Ambulatory Visit: Payer: Self-pay

## 2019-04-04 DIAGNOSIS — F411 Generalized anxiety disorder: Secondary | ICD-10-CM

## 2019-04-04 DIAGNOSIS — I10 Essential (primary) hypertension: Secondary | ICD-10-CM

## 2019-04-04 MED ORDER — AMLODIPINE BESYLATE 2.5 MG PO TABS: ORAL_TABLET | ORAL | 1 refills | Status: DC

## 2019-04-04 MED ORDER — ESCITALOPRAM OXALATE 20 MG PO TABS: ORAL_TABLET | ORAL | 1 refills | Status: DC

## 2019-04-06 ENCOUNTER — Other Ambulatory Visit: Payer: Self-pay

## 2019-04-06 DIAGNOSIS — K219 Gastro-esophageal reflux disease without esophagitis: Secondary | ICD-10-CM

## 2019-04-06 MED ORDER — PANTOPRAZOLE SODIUM 40 MG PO TBEC: DELAYED_RELEASE_TABLET | ORAL | 1 refills | Status: DC

## 2019-04-10 DIAGNOSIS — C44622 Squamous cell carcinoma of skin of right upper limb, including shoulder: Secondary | ICD-10-CM | POA: Diagnosis not present

## 2019-05-05 DIAGNOSIS — H2513 Age-related nuclear cataract, bilateral: Secondary | ICD-10-CM | POA: Diagnosis not present

## 2019-05-16 DIAGNOSIS — H2513 Age-related nuclear cataract, bilateral: Secondary | ICD-10-CM | POA: Diagnosis not present

## 2019-06-07 ENCOUNTER — Telehealth: Payer: Self-pay

## 2019-06-07 NOTE — Telephone Encounter (Signed)
Patient called cancelled appointment on 06/08/2019 did not want to reschedule at this time. klh

## 2019-06-09 ENCOUNTER — Ambulatory Visit: Payer: Medicare Other | Admitting: Nurse Practitioner

## 2019-07-25 DIAGNOSIS — H2511 Age-related nuclear cataract, right eye: Secondary | ICD-10-CM | POA: Diagnosis not present

## 2019-08-10 ENCOUNTER — Encounter: Payer: Self-pay | Admitting: Ophthalmology

## 2019-08-10 ENCOUNTER — Other Ambulatory Visit: Payer: Self-pay

## 2019-08-15 ENCOUNTER — Other Ambulatory Visit: Payer: Self-pay

## 2019-08-15 ENCOUNTER — Other Ambulatory Visit
Admission: RE | Admit: 2019-08-15 | Discharge: 2019-08-15 | Disposition: A | Discharge status: Home / Self Care | Payer: Medicare Other | Source: Ambulatory Visit | Attending: Ophthalmology | Admitting: Ophthalmology

## 2019-08-15 DIAGNOSIS — D2271 Melanocytic nevi of right lower limb, including hip: Secondary | ICD-10-CM | POA: Diagnosis not present

## 2019-08-15 DIAGNOSIS — L570 Actinic keratosis: Secondary | ICD-10-CM | POA: Diagnosis not present

## 2019-08-15 DIAGNOSIS — Z20822 Contact with and (suspected) exposure to covid-19: Secondary | ICD-10-CM | POA: Insufficient documentation

## 2019-08-15 DIAGNOSIS — D2261 Melanocytic nevi of right upper limb, including shoulder: Secondary | ICD-10-CM | POA: Diagnosis not present

## 2019-08-15 DIAGNOSIS — Z85828 Personal history of other malignant neoplasm of skin: Secondary | ICD-10-CM | POA: Diagnosis not present

## 2019-08-15 DIAGNOSIS — D225 Melanocytic nevi of trunk: Secondary | ICD-10-CM | POA: Diagnosis not present

## 2019-08-15 DIAGNOSIS — Z01812 Encounter for preprocedural laboratory examination: Secondary | ICD-10-CM | POA: Insufficient documentation

## 2019-08-15 DIAGNOSIS — D2262 Melanocytic nevi of left upper limb, including shoulder: Secondary | ICD-10-CM | POA: Diagnosis not present

## 2019-08-15 DIAGNOSIS — D2272 Melanocytic nevi of left lower limb, including hip: Secondary | ICD-10-CM | POA: Diagnosis not present

## 2019-08-15 DIAGNOSIS — X32XXXA Exposure to sunlight, initial encounter: Secondary | ICD-10-CM | POA: Diagnosis not present

## 2019-08-15 LAB — SARS CORONAVIRUS 2 (TAT 6-24 HRS): SARS Coronavirus 2: NEGATIVE

## 2019-08-15 NOTE — Discharge Instructions (Signed)

## 2019-08-17 ENCOUNTER — Other Ambulatory Visit: Payer: Self-pay

## 2019-08-17 ENCOUNTER — Ambulatory Visit
Admission: RE | Admit: 2019-08-17 | Discharge: 2019-08-17 | Disposition: A | Discharge status: Home / Self Care | Payer: Medicare Other | Attending: Ophthalmology | Admitting: Ophthalmology

## 2019-08-17 ENCOUNTER — Encounter
Admission: RE | Disposition: A | Discharge status: Home / Self Care | Payer: Self-pay | Source: Home / Self Care | Attending: Ophthalmology

## 2019-08-17 ENCOUNTER — Ambulatory Visit: Payer: Medicare Other | Admitting: Anesthesiology

## 2019-08-17 DIAGNOSIS — K219 Gastro-esophageal reflux disease without esophagitis: Secondary | ICD-10-CM | POA: Diagnosis not present

## 2019-08-17 DIAGNOSIS — Z79899 Other long term (current) drug therapy: Secondary | ICD-10-CM | POA: Insufficient documentation

## 2019-08-17 DIAGNOSIS — I10 Essential (primary) hypertension: Secondary | ICD-10-CM | POA: Diagnosis not present

## 2019-08-17 DIAGNOSIS — J449 Chronic obstructive pulmonary disease, unspecified: Secondary | ICD-10-CM | POA: Diagnosis not present

## 2019-08-17 DIAGNOSIS — F329 Major depressive disorder, single episode, unspecified: Secondary | ICD-10-CM | POA: Insufficient documentation

## 2019-08-17 DIAGNOSIS — M1990 Unspecified osteoarthritis, unspecified site: Secondary | ICD-10-CM | POA: Insufficient documentation

## 2019-08-17 DIAGNOSIS — F419 Anxiety disorder, unspecified: Secondary | ICD-10-CM | POA: Diagnosis not present

## 2019-08-17 DIAGNOSIS — H2511 Age-related nuclear cataract, right eye: Secondary | ICD-10-CM | POA: Insufficient documentation

## 2019-08-17 DIAGNOSIS — Z880 Allergy status to penicillin: Secondary | ICD-10-CM | POA: Diagnosis not present

## 2019-08-17 DIAGNOSIS — R002 Palpitations: Secondary | ICD-10-CM | POA: Diagnosis not present

## 2019-08-17 DIAGNOSIS — H25811 Combined forms of age-related cataract, right eye: Secondary | ICD-10-CM | POA: Diagnosis not present

## 2019-08-17 DIAGNOSIS — R0602 Shortness of breath: Secondary | ICD-10-CM | POA: Insufficient documentation

## 2019-08-17 DIAGNOSIS — Z9049 Acquired absence of other specified parts of digestive tract: Secondary | ICD-10-CM | POA: Insufficient documentation

## 2019-08-17 DIAGNOSIS — R011 Cardiac murmur, unspecified: Secondary | ICD-10-CM | POA: Insufficient documentation

## 2019-08-17 DIAGNOSIS — F17200 Nicotine dependence, unspecified, uncomplicated: Secondary | ICD-10-CM | POA: Insufficient documentation

## 2019-08-17 HISTORY — PX: CATARACT EXTRACTION W/PHACO: SHX586

## 2019-08-17 SURGERY — CATARACT EXTRACTION PHACO AND INTRAOCULAR LENS PLACEMENT (IOC)
Anesthesia: Monitor Anesthesia Care | Site: Eye | Laterality: Right

## 2019-08-17 MED ORDER — NA CHONDROIT SULF-NA HYALURON 40-17 MG/ML IO SOLN
INTRAOCULAR | Status: DC | PRN
  Administered 2019-08-17: 1 mL via INTRAOCULAR

## 2019-08-17 MED ORDER — FENTANYL CITRATE (PF) 100 MCG/2ML IJ SOLN
INTRAMUSCULAR | Status: DC | PRN
  Administered 2019-08-17 (×2): 50 ug via INTRAVENOUS

## 2019-08-17 MED ORDER — TRYPAN BLUE 0.06 % OP SOLN
OPHTHALMIC | Status: DC | PRN
  Administered 2019-08-17: 0.5 mL via INTRAOCULAR

## 2019-08-17 MED ORDER — TETRACAINE HCL 0.5 % OP SOLN
1.0000 [drp] | OPHTHALMIC | Status: DC | PRN
  Administered 2019-08-17 (×3): 1 [drp] via OPHTHALMIC

## 2019-08-17 MED ORDER — MOXIFLOXACIN HCL 0.5 % OP SOLN
OPHTHALMIC | Status: DC | PRN
  Administered 2019-08-17: 0.2 mL via OPHTHALMIC

## 2019-08-17 MED ORDER — TETRACAINE 0.5 % OP SOLN OPTIME - NO CHARGE
OPHTHALMIC | Status: DC | PRN
  Administered 2019-08-17: 2 [drp] via OPHTHALMIC

## 2019-08-17 MED ORDER — EPINEPHRINE PF 1 MG/ML IJ SOLN
INTRAOCULAR | Status: DC | PRN
  Administered 2019-08-17: 65 mL via OPHTHALMIC

## 2019-08-17 MED ORDER — LACTATED RINGERS IV SOLN: 100.0000 mL/h | INTRAVENOUS | Status: DC

## 2019-08-17 MED ORDER — MIDAZOLAM HCL 2 MG/2ML IJ SOLN
INTRAMUSCULAR | Status: DC | PRN
  Administered 2019-08-17: 2 mg via INTRAVENOUS

## 2019-08-17 MED ORDER — LIDOCAINE HCL (PF) 2 % IJ SOLN
INTRAOCULAR | Status: DC | PRN
  Administered 2019-08-17: 1 mL via INTRAOCULAR

## 2019-08-17 MED ORDER — BRIMONIDINE TARTRATE-TIMOLOL 0.2-0.5 % OP SOLN
OPHTHALMIC | Status: DC | PRN
  Administered 2019-08-17: 1 [drp] via OPHTHALMIC

## 2019-08-17 MED ORDER — ARMC OPHTHALMIC DILATING DROPS
1.0000 "application " | OPHTHALMIC | Status: DC | PRN
  Administered 2019-08-17 (×3): 1 "application " via OPHTHALMIC

## 2019-08-17 MED ORDER — GLYCOPYRROLATE 0.2 MG/ML IJ SOLN
INTRAMUSCULAR | Status: DC | PRN
  Administered 2019-08-17: .2 mg via INTRAVENOUS

## 2019-08-17 MED ORDER — PROVISC 10 MG/ML IO SOLN
INTRAOCULAR | Status: DC | PRN
  Administered 2019-08-17: 0.55 mL via INTRAOCULAR

## 2019-08-17 SURGICAL SUPPLY — 16 items
DISSECTOR HYDRO NUCLEUS 50X22 (MISCELLANEOUS) ×8
DRSG TEGADERM 2-3/8X2-3/4 SM (GAUZE/BANDAGES/DRESSINGS) ×2
GLOVE BIOGEL PI IND STRL 8 (GLOVE) ×1
GLOVE BIOGEL PI INDICATOR 8 (GLOVE) ×1
GOWN STRL REUS W/ TWL LRG LVL3 (GOWN DISPOSABLE) ×1
GOWN STRL REUS W/ TWL XL LVL3 (GOWN DISPOSABLE) ×1
GOWN STRL REUS W/TWL LRG LVL3 (GOWN DISPOSABLE) ×2
GOWN STRL REUS W/TWL XL LVL3 (GOWN DISPOSABLE) ×2
KNIFE 45D UP 2.3 (MISCELLANEOUS) ×2
LENS IOL DIOP 21.5 (Intraocular Lens) ×2 IMPLANT
PACK CATARACT (MISCELLANEOUS) ×2
PACK DR. KING ARMS (PACKS) ×2
PACK EYE AFTER SURG (MISCELLANEOUS) ×2
SOLUTION OPHTHALMIC SALT (MISCELLANEOUS) ×2
WATER STERILE IRR 250ML POUR (IV SOLUTION) ×2
WIPE NON LINTING 3.25X3.25 (MISCELLANEOUS) ×2

## 2019-08-17 NOTE — H&P (Signed)
   I have reviewed the patient's H&P and agree with its findings. There have been no interval changes.  Bernece Gall MD Ophthalmology 

## 2019-08-17 NOTE — Anesthesia Postprocedure Evaluation (Signed)
Anesthesia Post Note  Patient: Shannon Li  Procedure(s) Performed: CATARACT EXTRACTION PHACO AND INTRAOCULAR LENS PLACEMENT (IOC) RIGHT VISION BLUE 5.32  00:42.7 (Right Eye)     Patient location during evaluation: PACU Anesthesia Type: MAC Level of consciousness: awake and alert and oriented Pain management: satisfactory to patient Vital Signs Assessment: post-procedure vital signs reviewed and stable Respiratory status: spontaneous breathing, nonlabored ventilation and respiratory function stable Cardiovascular status: blood pressure returned to baseline and stable Postop Assessment: Adequate PO intake and No signs of nausea or vomiting Anesthetic complications: no    Raliegh Ip

## 2019-08-17 NOTE — Anesthesia Procedure Notes (Signed)
Procedure Name: MAC Performed by: Tkai Large M, CRNA Pre-anesthesia Checklist: Timeout performed, Patient being monitored, Suction available, Emergency Drugs available and Patient identified Patient Re-evaluated:Patient Re-evaluated prior to induction Oxygen Delivery Method: Nasal cannula       

## 2019-08-17 NOTE — Transfer of Care (Signed)
Immediate Anesthesia Transfer of Care Note  Patient: Shannon Li  Procedure(s) Performed: CATARACT EXTRACTION PHACO AND INTRAOCULAR LENS PLACEMENT (IOC) RIGHT VISION BLUE 5.32  00:42.7 (Right Eye)  Patient Location: PACU  Anesthesia Type: MAC  Level of Consciousness: awake, alert  and patient cooperative  Airway and Oxygen Therapy: Patient Spontanous Breathing and Patient connected to supplemental oxygen  Post-op Assessment: Post-op Vital signs reviewed, Patient's Cardiovascular Status Stable, Respiratory Function Stable, Patent Airway and No signs of Nausea or vomiting  Post-op Vital Signs: Reviewed and stable  Complications: No apparent anesthesia complications

## 2019-08-17 NOTE — Anesthesia Procedure Notes (Signed)
Procedure Name: MAC Performed by: Hayes Rehfeldt M, CRNA Pre-anesthesia Checklist: Patient identified, Emergency Drugs available, Suction available, Patient being monitored and Timeout performed Patient Re-evaluated:Patient Re-evaluated prior to induction Oxygen Delivery Method: Nasal cannula       

## 2019-08-17 NOTE — Anesthesia Procedure Notes (Signed)
Procedure Name: MAC Performed by: Yadir Zentner M, CRNA Pre-anesthesia Checklist: Timeout performed, Patient being monitored, Suction available, Emergency Drugs available and Patient identified Patient Re-evaluated:Patient Re-evaluated prior to induction Oxygen Delivery Method: Nasal cannula       

## 2019-08-17 NOTE — Op Note (Signed)
  PREOPERATIVE DIAGNOSIS:  Nuclear sclerotic cataract of the RIGHT eye.   POSTOPERATIVE DIAGNOSIS:  Nuclear sclerotic cataract of the RIGHT eye.   OPERATIVE PROCEDURE: Cataract surgery OD   SURGEON:  Marchia Meiers, MD.   ANESTHESIA:  Anesthesiologist: Ronelle Nigh, MD CRNA: Izetta Dakin, CRNA  1.      Managed anesthesia care. 2.     0.54ml of Shugarcaine was instilled following the paracentesis   COMPLICATIONS:  None.   TECHNIQUE:   Divide and conquer   DESCRIPTION OF PROCEDURE:  The patient was examined and consented in the preoperative holding area where the aforementioned topical anesthesia was applied to the RIGHT eye and then brought back to the Operating Room where the RIGHT eye was prepped and draped in the usual sterile ophthalmic fashion and a lid speculum was placed. A paracentesis was created with the side port blade, the anterior chamber was washed out with trypan blue to stain the anterior capsule, and the anterior chamber was filled with viscoelastic. A near clear corneal incision was performed with the steel keratome. A continuous curvilinear capsulorrhexis was performed with a cystotome followed by the capsulorrhexis forceps. Hydrodissection and hydrodelineation were carried out with BSS on a blunt cannula. The lens was removed in a divide and conquer  technique and the remaining cortical material was removed with the irrigation-aspiration handpiece. The capsular bag was inflated with viscoelastic and the lens was placed in the capsular bag without complication. The remaining viscoelastic was removed from the eye with the irrigation-aspiration handpiece. The wounds were hydrated. The anterior chamber was flushed and the eye was inflated to physiologic pressure. 0.9ml Vigamox was placed in the anterior chamber. The wounds were found to be water tight. The eye was dressed with Vigamox. The patient was given protective glasses to wear throughout the day and a shield with which  to sleep tonight. The patient was also given drops with which to begin a drop regimen today and will follow-up with me in one day. Implant Name Type Inv. Item Serial No. Manufacturer Lot No. LRB No. Used Action  LENS IOL DIOP 21.5 - [id number] Intraocular Lens LENS IOL DIOP 21.5 [id number]  Right 1 Implanted    Procedure(s): CATARACT EXTRACTION PHACO AND INTRAOCULAR LENS PLACEMENT (IOC) RIGHT VISION BLUE 5.32  00:42.7 (Right)  Electronically signed: Marchia Meiers 08/17/2019 1:02 PM

## 2019-08-17 NOTE — Anesthesia Preprocedure Evaluation (Signed)
Anesthesia Evaluation  Patient identified by MRN, date of birth, ID band Patient awake    Reviewed: Allergy & Precautions, H&P , NPO status , Patient's Chart, lab work & pertinent test results  Airway Mallampati: II  TM Distance: >3 FB Neck ROM: full    Dental no notable dental hx.    Pulmonary shortness of breath and with exertion, COPD, Current Smoker and Patient abstained from smoking.,    Pulmonary exam normal breath sounds clear to auscultation       Cardiovascular hypertension, Normal cardiovascular exam Rhythm:regular Rate:Normal     Neuro/Psych    GI/Hepatic GERD  ,  Endo/Other    Renal/GU      Musculoskeletal   Abdominal   Peds  Hematology   Anesthesia Other Findings   Reproductive/Obstetrics                             Anesthesia Physical Anesthesia Plan  ASA: III  Anesthesia Plan: MAC   Post-op Pain Management:    Induction:   PONV Risk Score and Plan: 2 and Treatment may vary due to age or medical condition, TIVA and Midazolam  Airway Management Planned:   Additional Equipment:   Intra-op Plan:   Post-operative Plan:   Informed Consent: I have reviewed the patients History and Physical, chart, labs and discussed the procedure including the risks, benefits and alternatives for the proposed anesthesia with the patient or authorized representative who has indicated his/her understanding and acceptance.     Dental Advisory Given  Plan Discussed with: CRNA  Anesthesia Plan Comments:         Anesthesia Quick Evaluation

## 2019-08-18 ENCOUNTER — Encounter: Payer: Self-pay | Admitting: *Deleted

## 2019-09-05 ENCOUNTER — Telehealth: Payer: Self-pay

## 2019-09-05 NOTE — Telephone Encounter (Signed)
Confirmed and screened for 09-07-19 ov. 

## 2019-09-07 ENCOUNTER — Ambulatory Visit: Payer: Medicare Other | Admitting: Nurse Practitioner

## 2019-09-07 ENCOUNTER — Other Ambulatory Visit: Payer: Self-pay | Admitting: Nurse Practitioner

## 2019-09-07 ENCOUNTER — Other Ambulatory Visit: Payer: Self-pay

## 2019-09-07 ENCOUNTER — Encounter: Payer: Self-pay | Admitting: Nurse Practitioner

## 2019-09-07 VITALS — BP 142/80 | HR 67 | Temp 97.3°F | Resp 16 | Ht 68.0 in | Wt 191.0 lb

## 2019-09-07 DIAGNOSIS — I10 Essential (primary) hypertension: Secondary | ICD-10-CM | POA: Diagnosis not present

## 2019-09-07 DIAGNOSIS — E559 Vitamin D deficiency, unspecified: Secondary | ICD-10-CM | POA: Diagnosis not present

## 2019-09-07 DIAGNOSIS — R300 Dysuria: Secondary | ICD-10-CM

## 2019-09-07 DIAGNOSIS — Z1231 Encounter for screening mammogram for malignant neoplasm of breast: Secondary | ICD-10-CM

## 2019-09-07 DIAGNOSIS — Z118 Encounter for screening for other infectious and parasitic diseases: Secondary | ICD-10-CM | POA: Diagnosis not present

## 2019-09-07 DIAGNOSIS — R0609 Other forms of dyspnea: Secondary | ICD-10-CM

## 2019-09-07 DIAGNOSIS — Z0001 Encounter for general adult medical examination with abnormal findings: Secondary | ICD-10-CM | POA: Diagnosis not present

## 2019-09-07 DIAGNOSIS — R0600 Dyspnea, unspecified: Secondary | ICD-10-CM

## 2019-09-07 DIAGNOSIS — R7301 Impaired fasting glucose: Secondary | ICD-10-CM | POA: Diagnosis not present

## 2019-09-07 NOTE — Progress Notes (Signed)
[facility] [place], [place] [address]  Internal MEDICINE  Office Visit Note  Patient Name: Shannon Li  [id number]  [id number]  Date of Service: 09/07/2019   Pt is here for routine health maintenance examination  Chief Complaint  Patient presents with  . Medicare Wellness  . Hypertension  . Gastroesophageal Reflux  . Depression  . Arthritis  . Anxiety  . COPD     The patient is here for routine health maintenance exam. She states that she has some trouble with swelling present in her left lower leg, ankle, and foot. This is usually worse after she has been on her feet for a long period of time. She states that sometimes, this gets better at night after resting. Sometimes it does not improve. She also gets some shortness of breath with exertion.  She is due to have routine, fasting labs as well as screening mammogram. She is unsure about getting vaccines for COVID 19.    Current Medication: Outpatient Encounter Medications as of 09/07/2019  Medication Sig  . amLODipine (NORVASC) 2.5 MG tablet Take one tablet by mouth daily.  . bisoprolol-hydrochlorothiazide (ZIAC) 2.5-6.25 MG tablet Take 1 tablet po qd for blood pressure  . busPIRone (BUSPAR) 5 MG tablet Take 1 tablet po two to three times per day prn anxiety  . escitalopram (LEXAPRO) 20 MG tablet Take one tablet by mouth daily  . pantoprazole (PROTONIX) 40 MG tablet Take 1 tablet two times daily  . [DISCONTINUED] albuterol (PROVENTIL HFA) 108 (90 Base) MCG/ACT inhaler Inhale 2 puffs into the lungs every 4 (four) hours as needed for wheezing or shortness of breath. (Patient not taking: Reported on 08/10/2019)   No facility-administered encounter medications on file as of 09/07/2019.    Surgical History: Past Surgical History:  Procedure Laterality Date  . ABSCESS DRAINAGE  11/17/13   buttock right  . BREAST BIOPSY Right    neg  . CATARACT EXTRACTION W/PHACO Right 08/17/2019   Procedure:  CATARACT EXTRACTION PHACO AND INTRAOCULAR LENS PLACEMENT ([place]) RIGHT VISION BLUE 5.32  00:42.7;  Surgeon: Marchia Meiers, MD;  Location: [facility];  Service: Ophthalmology;  Laterality: Right;  . CHOLECYSTECTOMY  2008  . COLONOSCOPY  2014   [facility] in [place]  . COLONOSCOPY WITH PROPOFOL N/A 08/26/2015   Procedure: COLONOSCOPY WITH PROPOFOL;  Surgeon: Hulen Luster, MD;  Location: [facility] ENDOSCOPY;  Service: Gastroenterology;  Laterality: N/A;  . ESOPHAGOGASTRODUODENOSCOPY N/A 11/15/2014   Procedure: ESOPHAGOGASTRODUODENOSCOPY (EGD);  Surgeon: Hulen Luster, MD;  Location: [facility] ENDOSCOPY;  Service: Gastroenterology;  Laterality: N/A;  . UPPER GI ENDOSCOPY  2014   [facility] in [place] History: Past Medical History:  Diagnosis Date  . Anxiety   . Arthritis   . Atrophic kidney   . Chronic constipation   . Colon polyp 2013  . COPD (chronic obstructive pulmonary disease) ([place])   . Depression   . Diverticulosis   . Esophageal stricture   . Esophagitis   . Fibrocystic breast disease   . GERD (gastroesophageal reflux disease)   . History of hiatal hernia   . Hypertension   . IBS (irritable bowel syndrome)   . Menopause   . Ovarian cyst   . Plantar fasciitis   . Postmenopausal   . Renal artery occlusion ([place])   . Renal insufficiency    Right kidney is small and not functioning.    Family History: Family History  Problem Relation Age of Onset  .  Heart disease Mother   . Heart attack Mother   . Diabetes Mother   . Alzheimer's disease Father   . Breast cancer Sister 15  . Lung cancer Brother   . Brain cancer Brother   . Lung cancer Sister   . Bladder Cancer Sister   . Lung cancer Brother   . Leukemia Brother   . Diabetes Brother       Review of Systems  Constitutional: Positive for fatigue. Negative for chills and unexpected weight change.  HENT: Negative for congestion, postnasal drip, rhinorrhea, sinus pressure, sneezing and sore throat.    Respiratory: Positive for shortness of breath. Negative for cough, chest tightness and wheezing.   Cardiovascular: Positive for leg swelling. Negative for chest pain and palpitations.  Gastrointestinal: Negative for abdominal pain, constipation, diarrhea, nausea and vomiting.  Endocrine: Negative for cold intolerance, heat intolerance, polydipsia and polyuria.  Genitourinary: Negative for dysuria, frequency and urgency.  Musculoskeletal: Positive for back pain. Negative for arthralgias, joint swelling and neck pain.  Skin: Negative for rash.  Allergic/Immunologic: Positive for environmental allergies.  Neurological: Positive for headaches. Negative for tremors and numbness.  Hematological: Negative for adenopathy. Does not bruise/bleed easily.  Psychiatric/Behavioral: Positive for dysphoric mood. Negative for behavioral problems (Depression), sleep disturbance and suicidal ideas. The patient is nervous/anxious.        Anxiety/depression well managed. States that she rarely needs to take buspirone.      Today's Vitals   09/07/19 0856  BP: (!) 142/80  Pulse: 67  Resp: 16  Temp: (!) 97.3 F (36.3 C)  SpO2: 96%  Weight: 191 lb (86.6 kg)  Height: 5\' 8"  (1.727 m)   Body mass index is 29.04 kg/m.  Physical Exam   LABS: Recent Results (from the past 2160 hour(s))  SARS CORONAVIRUS 2 (TAT 6-24 HRS) Nasopharyngeal Nasopharyngeal Swab     Status: None   Collection Time: 08/15/19 12:03 PM   Specimen: Nasopharyngeal Swab  Result Value Ref Range   SARS Coronavirus 2 NEGATIVE NEGATIVE    Comment: (NOTE) SARS-CoV-2 target nucleic acids are NOT DETECTED. The SARS-CoV-2 RNA is generally detectable in upper and lower respiratory specimens during the acute phase of infection. Negative results do not preclude SARS-CoV-2 infection, do not rule out co-infections with other pathogens, and should not be used as the sole basis for treatment or other patient management decisions. Negative  results must be combined with clinical observations, patient history, and epidemiological information. The expected result is Negative. Fact Sheet for Patients: SugarRoll.be Fact Sheet for Healthcare Providers: https://www.woods-mathews.com/ This test is not yet approved or cleared by the [place] FDA and  has been authorized for detection and/or diagnosis of SARS-CoV-2 by FDA under an Emergency Use Authorization (EUA). This EUA will remain  in effect (meaning this test can be used) for the duration of the COVID-19 declaration under Section 56 4(b)(1) of the Act, 21 U.S.C. section 360bbb-3(b)(1), unless the authorization is terminated or revoked sooner. Performed at [facility], [place] [address], [place], [place] [address]     .MAMM    Assessment/Plan:   General Counseling: avaa ditomaso understanding of the findings of todays visit and agrees with plan of treatment. I have discussed any further diagnostic evaluation that may be needed or ordered today. We also reviewed her medications today. she has been encouraged to call the office with any questions or concerns that should arise related to todays visit.    Counseling:    Orders Placed This Encounter  Procedures  . Urinalysis, Routine w reflex microscopic    No orders of the defined types were placed in this encounter.   Total time spent: 65 Minutes  Time spent includes review of chart, medications, test results, and follow up plan with the patient.     Lavera Guise, MD  Internal Medicine

## 2019-09-07 NOTE — Addendum Note (Signed)
Addended by: Leretha Pol on: 09/07/2019 10:17 AM   Modules accepted: Orders

## 2019-09-08 LAB — COMPREHENSIVE METABOLIC PANEL WITH GFR
ALT: 10 [IU]/L (ref 0–32)
AST: 18 [IU]/L (ref 0–40)
Albumin/Globulin Ratio: 1.6 (ref 1.2–2.2)
Albumin: 4.1 g/dL (ref 3.8–4.8)
Alkaline Phosphatase: 78 [IU]/L (ref 48–121)
BUN/Creatinine Ratio: 15 (ref 12–28)
BUN: 21 mg/dL (ref 8–27)
Bilirubin Total: 0.4 mg/dL (ref 0.0–1.2)
CO2: 27 mmol/L (ref 20–29)
Calcium: 9.3 mg/dL (ref 8.7–10.3)
Chloride: 99 mmol/L (ref 96–106)
Creatinine, Ser: 1.4 mg/dL — ABNORMAL HIGH (ref 0.57–1.00)
GFR calc Af Amer: 44 mL/min/{1.73_m2} — ABNORMAL LOW
GFR calc non Af Amer: 38 mL/min/{1.73_m2} — ABNORMAL LOW
Globulin, Total: 2.5 g/dL (ref 1.5–4.5)
Glucose: 93 mg/dL (ref 65–99)
Potassium: 4.7 mmol/L (ref 3.5–5.2)
Sodium: 139 mmol/L (ref 134–144)
Total Protein: 6.6 g/dL (ref 6.0–8.5)

## 2019-09-08 LAB — LIPID PANEL WITH LDL/HDL RATIO
Cholesterol, Total: 218 mg/dL — ABNORMAL HIGH (ref 100–199)
HDL: 41 mg/dL
LDL Chol Calc (NIH): 148 mg/dL — ABNORMAL HIGH (ref 0–99)
LDL/HDL Ratio: 3.6 ratio — ABNORMAL HIGH (ref 0.0–3.2)
Triglycerides: 162 mg/dL — ABNORMAL HIGH (ref 0–149)
VLDL Cholesterol Cal: 29 mg/dL (ref 5–40)

## 2019-09-08 LAB — CBC
Hematocrit: 45.9 % (ref 34.0–46.6)
Hemoglobin: 15.4 g/dL (ref 11.1–15.9)
MCH: 30 pg (ref 26.6–33.0)
MCHC: 33.6 g/dL (ref 31.5–35.7)
MCV: 89 fL (ref 79–97)
Platelets: 292 10*3/uL (ref 150–450)
RBC: 5.14 x10E6/uL (ref 3.77–5.28)
RDW: 13.4 % (ref 11.7–15.4)
WBC: 6.7 10*3/uL (ref 3.4–10.8)

## 2019-09-08 LAB — URINALYSIS, ROUTINE W REFLEX MICROSCOPIC
Bilirubin, UA: NEGATIVE
Glucose, UA: NEGATIVE
Ketones, UA: NEGATIVE
Leukocytes,UA: NEGATIVE
Nitrite, UA: NEGATIVE
Protein,UA: NEGATIVE
RBC, UA: NEGATIVE
Specific Gravity, UA: 1.018 (ref 1.005–1.030)
Urobilinogen, Ur: 0.2 mg/dL (ref 0.2–1.0)
pH, UA: 6 (ref 5.0–7.5)

## 2019-09-08 LAB — HCV COMMENT:

## 2019-09-08 LAB — TSH: TSH: 2.11 u[IU]/mL (ref 0.450–4.500)

## 2019-09-08 LAB — HGB A1C W/O EAG: Hgb A1c MFr Bld: 5.8 % — ABNORMAL HIGH (ref 4.8–5.6)

## 2019-09-08 LAB — T4, FREE: Free T4: 1.32 ng/dL (ref 0.82–1.77)

## 2019-09-08 LAB — VITAMIN D 25 HYDROXY (VIT D DEFICIENCY, FRACTURES): Vit D, 25-Hydroxy: 32.9 ng/mL (ref 30.0–100.0)

## 2019-09-08 LAB — HEPATITIS C ANTIBODY (REFLEX): HCV Ab: <0.1 {s_co_ratio} (ref 0.0–0.9)

## 2019-09-11 DIAGNOSIS — H2512 Age-related nuclear cataract, left eye: Secondary | ICD-10-CM | POA: Diagnosis not present

## 2019-09-11 DIAGNOSIS — I10 Essential (primary) hypertension: Secondary | ICD-10-CM | POA: Diagnosis not present

## 2019-09-12 ENCOUNTER — Encounter: Payer: Self-pay | Admitting: Ophthalmology

## 2019-09-12 ENCOUNTER — Other Ambulatory Visit: Payer: Self-pay

## 2019-09-12 NOTE — Progress Notes (Signed)
Labs ok. Discuss with patient at visit 10/20/2019

## 2019-09-19 ENCOUNTER — Other Ambulatory Visit
Admission: RE | Admit: 2019-09-19 | Discharge: 2019-09-19 | Disposition: A | Discharge status: Home / Self Care | Payer: Medicare Other | Source: Ambulatory Visit | Attending: Ophthalmology | Admitting: Ophthalmology

## 2019-09-19 ENCOUNTER — Other Ambulatory Visit: Payer: Self-pay

## 2019-09-19 DIAGNOSIS — Z20822 Contact with and (suspected) exposure to covid-19: Secondary | ICD-10-CM | POA: Diagnosis not present

## 2019-09-19 DIAGNOSIS — Z01812 Encounter for preprocedural laboratory examination: Secondary | ICD-10-CM | POA: Insufficient documentation

## 2019-09-19 NOTE — Discharge Instructions (Signed)

## 2019-09-20 LAB — SARS CORONAVIRUS 2 (TAT 6-24 HRS): SARS Coronavirus 2: NEGATIVE

## 2019-09-21 ENCOUNTER — Other Ambulatory Visit: Payer: Self-pay

## 2019-09-21 ENCOUNTER — Ambulatory Visit: Payer: Medicare Other | Admitting: Anesthesiology

## 2019-09-21 ENCOUNTER — Encounter
Admission: RE | Disposition: A | Discharge status: Home / Self Care | Payer: Self-pay | Source: Home / Self Care | Attending: Ophthalmology

## 2019-09-21 ENCOUNTER — Ambulatory Visit
Admission: RE | Admit: 2019-09-21 | Discharge: 2019-09-21 | Disposition: A | Discharge status: Home / Self Care | Payer: Medicare Other | Attending: Ophthalmology | Admitting: Ophthalmology

## 2019-09-21 ENCOUNTER — Encounter: Payer: Self-pay | Admitting: Ophthalmology

## 2019-09-21 DIAGNOSIS — H2512 Age-related nuclear cataract, left eye: Secondary | ICD-10-CM | POA: Diagnosis present

## 2019-09-21 DIAGNOSIS — Z85828 Personal history of other malignant neoplasm of skin: Secondary | ICD-10-CM | POA: Insufficient documentation

## 2019-09-21 DIAGNOSIS — F17200 Nicotine dependence, unspecified, uncomplicated: Secondary | ICD-10-CM | POA: Insufficient documentation

## 2019-09-21 DIAGNOSIS — F329 Major depressive disorder, single episode, unspecified: Secondary | ICD-10-CM | POA: Diagnosis not present

## 2019-09-21 DIAGNOSIS — F419 Anxiety disorder, unspecified: Secondary | ICD-10-CM | POA: Insufficient documentation

## 2019-09-21 DIAGNOSIS — Z79899 Other long term (current) drug therapy: Secondary | ICD-10-CM | POA: Diagnosis not present

## 2019-09-21 DIAGNOSIS — K219 Gastro-esophageal reflux disease without esophagitis: Secondary | ICD-10-CM | POA: Diagnosis not present

## 2019-09-21 DIAGNOSIS — J449 Chronic obstructive pulmonary disease, unspecified: Secondary | ICD-10-CM | POA: Diagnosis not present

## 2019-09-21 DIAGNOSIS — M1990 Unspecified osteoarthritis, unspecified site: Secondary | ICD-10-CM | POA: Diagnosis not present

## 2019-09-21 HISTORY — PX: CATARACT EXTRACTION W/PHACO: SHX586

## 2019-09-21 SURGERY — CATARACT EXTRACTION PHACO AND INTRAOCULAR LENS PLACEMENT (IOC)
Anesthesia: Monitor Anesthesia Care | Site: Eye | Laterality: Left

## 2019-09-21 MED ORDER — PROVISC 10 MG/ML IO SOLN
INTRAOCULAR | Status: DC | PRN
  Administered 2019-09-21: 0.55 mL via INTRAOCULAR

## 2019-09-21 MED ORDER — BRIMONIDINE TARTRATE-TIMOLOL 0.2-0.5 % OP SOLN
OPHTHALMIC | Status: DC | PRN
  Administered 2019-09-21: 1 [drp] via OPHTHALMIC

## 2019-09-21 MED ORDER — TRYPAN BLUE 0.06 % OP SOLN
OPHTHALMIC | Status: DC | PRN
  Administered 2019-09-21: 0.5 mL via INTRAOCULAR

## 2019-09-21 MED ORDER — FENTANYL CITRATE (PF) 100 MCG/2ML IJ SOLN
INTRAMUSCULAR | Status: DC | PRN
  Administered 2019-09-21: 50 ug via INTRAVENOUS

## 2019-09-21 MED ORDER — MOXIFLOXACIN HCL 0.5 % OP SOLN
OPHTHALMIC | Status: DC | PRN
  Administered 2019-09-21: 0.2 mL via OPHTHALMIC

## 2019-09-21 MED ORDER — TETRACAINE 0.5 % OP SOLN OPTIME - NO CHARGE
OPHTHALMIC | Status: DC | PRN
  Administered 2019-09-21: 2 [drp] via OPHTHALMIC

## 2019-09-21 MED ORDER — MIDAZOLAM HCL 2 MG/2ML IJ SOLN
INTRAMUSCULAR | Status: DC | PRN
  Administered 2019-09-21: 1.5 mg via INTRAVENOUS

## 2019-09-21 MED ORDER — ONDANSETRON HCL 4 MG/2ML IJ SOLN: 4.0000 mg | Freq: Once | INTRAMUSCULAR | Status: DC | PRN

## 2019-09-21 MED ORDER — ACETAMINOPHEN 160 MG/5ML PO SOLN: 325.0000 mg | ORAL | Status: DC | PRN

## 2019-09-21 MED ORDER — LIDOCAINE HCL (PF) 2 % IJ SOLN
INTRAOCULAR | Status: DC | PRN
  Administered 2019-09-21: 1 mL via INTRAOCULAR

## 2019-09-21 MED ORDER — ACETAMINOPHEN 325 MG PO TABS: 325.0000 mg | ORAL_TABLET | ORAL | Status: DC | PRN

## 2019-09-21 MED ORDER — ARMC OPHTHALMIC DILATING DROPS
1.0000 "application " | OPHTHALMIC | Status: DC | PRN
  Administered 2019-09-21 (×3): 1 "application " via OPHTHALMIC

## 2019-09-21 MED ORDER — TETRACAINE HCL 0.5 % OP SOLN
1.0000 [drp] | OPHTHALMIC | Status: DC | PRN
  Administered 2019-09-21 (×3): 1 [drp] via OPHTHALMIC

## 2019-09-21 MED ORDER — NA CHONDROIT SULF-NA HYALURON 40-17 MG/ML IO SOLN
INTRAOCULAR | Status: DC | PRN
  Administered 2019-09-21: 1 mL via INTRAOCULAR

## 2019-09-21 MED ORDER — EPINEPHRINE PF 1 MG/ML IJ SOLN
INTRAOCULAR | Status: DC | PRN
  Administered 2019-09-21: 59 mL via OPHTHALMIC

## 2019-09-21 SURGICAL SUPPLY — 17 items
DISSECTOR HYDRO NUCLEUS 50X22 (MISCELLANEOUS) ×8
DRSG TEGADERM 2-3/8X2-3/4 SM (GAUZE/BANDAGES/DRESSINGS) ×2
GLOVE BIOGEL PI IND STRL 8 (GLOVE) ×3
GLOVE BIOGEL PI INDICATOR 8 (GLOVE) ×3
GOWN STRL REUS W/ TWL LRG LVL3 (GOWN DISPOSABLE) ×1
GOWN STRL REUS W/ TWL XL LVL3 (GOWN DISPOSABLE) ×1
GOWN STRL REUS W/TWL LRG LVL3 (GOWN DISPOSABLE) ×2
GOWN STRL REUS W/TWL XL LVL3 (GOWN DISPOSABLE) ×2
KNIFE 45D UP 2.3 (MISCELLANEOUS) ×2
LENS IOL DIOP 21.5 (Intraocular Lens) ×2 IMPLANT
MARKER SKIN DUAL TIP RULER LAB (MISCELLANEOUS) ×2
PACK CATARACT (MISCELLANEOUS) ×2
PACK DR. KING ARMS (PACKS) ×2
PACK EYE AFTER SURG (MISCELLANEOUS) ×2
SOLUTION OPHTHALMIC SALT (MISCELLANEOUS) ×2
WATER STERILE IRR 250ML POUR (IV SOLUTION) ×2
WIPE NON LINTING 3.25X3.25 (MISCELLANEOUS) ×2

## 2019-09-21 NOTE — H&P (Signed)
   I have reviewed the patient's H&P and agree with its findings. There have been no interval changes.  Marisa Hufstetler MD Ophthalmology 

## 2019-09-21 NOTE — Op Note (Signed)
  PREOPERATIVE DIAGNOSIS:  Nuclear sclerotic cataract of the LEFT eye.   POSTOPERATIVE DIAGNOSIS:  Nuclear sclerotic cataract of the LEFT eye.   OPERATIVE PROCEDURE: Cataract surgery OS   SURGEON:  Marchia Meiers, MD.   ANESTHESIA:  Anesthesiologist: Alisa Graff, MD CRNA: Mayme Genta, CRNA  1.      Managed anesthesia care. 2.     0.27ml of Shugarcaine was instilled following the paracentesis   COMPLICATIONS:  None.   TECHNIQUE:   Divide and conquer   DESCRIPTION OF PROCEDURE:  The patient was examined and consented in the preoperative holding area where the aforementioned topical anesthesia was applied to the LEFT eye and then brought back to the Operating Room where the left eye was prepped and draped in the usual sterile ophthalmic fashion and a lid speculum was placed. A paracentesis was created with the side port blade, the anterior chamber was washed out with trypan blue to stain the anterior capsule, and the anterior chamber was filled with viscoelastic. A near clear corneal incision was performed with the steel keratome. A continuous curvilinear capsulorrhexis was performed with a cystotome followed by the capsulorrhexis forceps. Hydrodissection and hydrodelineation were carried out with BSS on a blunt cannula. The lens was removed in a divide and conquer  technique and the remaining cortical material was removed with the irrigation-aspiration handpiece. The capsular bag was inflated with viscoelastic and the lens was placed in the capsular bag without complication. The remaining viscoelastic was removed from the eye with the irrigation-aspiration handpiece. The wounds were hydrated. The anterior chamber was flushed and the eye was inflated to physiologic pressure. 0.38ml Vigamox was placed in the anterior chamber. The wounds were found to be water tight. The eye was dressed with Vigamox. The patient was given protective glasses to wear throughout the day and a shield with which to  sleep tonight. The patient was also given drops with which to begin a drop regimen today and will follow-up with me in one day. Implant Name Type Inv. Item Serial No. Manufacturer Lot No. LRB No. Used Action  LENS IOL DIOP 21.5 - [id number] Intraocular Lens LENS IOL DIOP 21.5 [id number]  Left 1 Implanted    Procedure(s) with comments: CATARACT EXTRACTION PHACO AND INTRAOCULAR LENS PLACEMENT (IOC) LEFT VISION BLUE (Left) - 6.24 0:40.6  Electronically signed: Samra Pesch 09/21/2019 11:45 AM

## 2019-09-21 NOTE — Anesthesia Preprocedure Evaluation (Signed)
Anesthesia Evaluation  Patient identified by MRN, date of birth, ID band Patient awake    Reviewed: Allergy & Precautions, H&P , NPO status , Patient's Chart, lab work & pertinent test results, reviewed documented beta blocker date and time   Airway Mallampati: II  TM Distance: >3 FB Neck ROM: full    Dental no notable dental hx.    Pulmonary COPD, Current Smoker and Patient abstained from smoking.,    Pulmonary exam normal breath sounds clear to auscultation       Cardiovascular Exercise Tolerance: Good hypertension, + Peripheral Vascular Disease   Rhythm:regular Rate:Normal     Neuro/Psych PSYCHIATRIC DISORDERS Anxiety Depression negative neurological ROS     GI/Hepatic Neg liver ROS, hiatal hernia, GERD  ,IBS   Endo/Other  negative endocrine ROS  Renal/GU CRFRenal diseaseAtrophic R kidney  negative genitourinary   Musculoskeletal   Abdominal   Peds  Hematology negative hematology ROS (+)   Anesthesia Other Findings   Reproductive/Obstetrics negative OB ROS                             Anesthesia Physical Anesthesia Plan  ASA: III  Anesthesia Plan: MAC   Post-op Pain Management:    Induction:   PONV Risk Score and Plan: 1 and TIVA and Treatment may vary due to age or medical condition  Airway Management Planned:   Additional Equipment:   Intra-op Plan:   Post-operative Plan:   Informed Consent: I have reviewed the patients History and Physical, chart, labs and discussed the procedure including the risks, benefits and alternatives for the proposed anesthesia with the patient or authorized representative who has indicated his/her understanding and acceptance.     Dental Advisory Given  Plan Discussed with: CRNA  Anesthesia Plan Comments:         Anesthesia Quick Evaluation

## 2019-09-21 NOTE — Transfer of Care (Signed)
Immediate Anesthesia Transfer of Care Note  Patient: Shannon Li  Procedure(s) Performed: CATARACT EXTRACTION PHACO AND INTRAOCULAR LENS PLACEMENT (IOC) LEFT VISION BLUE (Left Eye)  Patient Location: PACU  Anesthesia Type: MAC  Level of Consciousness: awake, alert  and patient cooperative  Airway and Oxygen Therapy: Patient Spontanous Breathing and Patient connected to supplemental oxygen  Post-op Assessment: Post-op Vital signs reviewed, Patient's Cardiovascular Status Stable, Respiratory Function Stable, Patent Airway and No signs of Nausea or vomiting  Post-op Vital Signs: Reviewed and stable  Complications: No apparent anesthesia complications

## 2019-09-21 NOTE — Anesthesia Procedure Notes (Signed)
Procedure Name: MAC Performed by: Gabrille Kilbride, CRNA Pre-anesthesia Checklist: Patient identified, Emergency Drugs available, Suction available, Timeout performed and Patient being monitored Patient Re-evaluated:Patient Re-evaluated prior to induction Oxygen Delivery Method: Nasal cannula Placement Confirmation: positive ETCO2       

## 2019-09-21 NOTE — Anesthesia Postprocedure Evaluation (Signed)
Anesthesia Post Note  Patient: Shannon Li  Procedure(s) Performed: CATARACT EXTRACTION PHACO AND INTRAOCULAR LENS PLACEMENT (IOC) LEFT VISION BLUE (Left Eye)     Patient location during evaluation: PACU Anesthesia Type: MAC Level of consciousness: awake and alert Pain management: pain level controlled Vital Signs Assessment: post-procedure vital signs reviewed and stable Respiratory status: spontaneous breathing, nonlabored ventilation, respiratory function stable and patient connected to nasal cannula oxygen Cardiovascular status: stable and blood pressure returned to baseline Postop Assessment: no apparent nausea or vomiting Anesthetic complications: no    Alisa Graff

## 2019-09-22 ENCOUNTER — Encounter: Payer: Self-pay | Admitting: *Deleted

## 2019-09-25 ENCOUNTER — Other Ambulatory Visit: Payer: Self-pay

## 2019-09-25 DIAGNOSIS — I10 Essential (primary) hypertension: Secondary | ICD-10-CM

## 2019-09-25 DIAGNOSIS — F411 Generalized anxiety disorder: Secondary | ICD-10-CM

## 2019-09-25 MED ORDER — AMLODIPINE BESYLATE 2.5 MG PO TABS: ORAL_TABLET | ORAL | 1 refills | Status: DC

## 2019-09-25 MED ORDER — BISOPROLOL-HYDROCHLOROTHIAZIDE 2.5-6.25 MG PO TABS: ORAL_TABLET | ORAL | 1 refills | Status: DC

## 2019-09-25 MED ORDER — ESCITALOPRAM OXALATE 20 MG PO TABS: ORAL_TABLET | ORAL | 1 refills | Status: DC

## 2019-09-26 ENCOUNTER — Ambulatory Visit
Admission: RE | Admit: 2019-09-26 | Discharge: 2019-09-26 | Disposition: A | Discharge status: Home / Self Care | Payer: Medicare Other | Source: Ambulatory Visit | Attending: Nurse Practitioner | Admitting: Nurse Practitioner

## 2019-09-26 DIAGNOSIS — Z1231 Encounter for screening mammogram for malignant neoplasm of breast: Secondary | ICD-10-CM | POA: Diagnosis present

## 2019-09-27 ENCOUNTER — Other Ambulatory Visit: Payer: Self-pay

## 2019-09-27 DIAGNOSIS — K219 Gastro-esophageal reflux disease without esophagitis: Secondary | ICD-10-CM

## 2019-09-27 MED ORDER — PANTOPRAZOLE SODIUM 40 MG PO TBEC: DELAYED_RELEASE_TABLET | ORAL | 1 refills | Status: DC

## 2019-09-27 NOTE — Progress Notes (Signed)
Negative mammogram

## 2019-10-11 ENCOUNTER — Telehealth: Payer: Self-pay

## 2019-10-11 NOTE — Telephone Encounter (Signed)
Confirmed appointment on 10/13/2019 and screened for covid. kl[facility]

## 2019-10-13 ENCOUNTER — Other Ambulatory Visit: Payer: Self-pay

## 2019-10-13 ENCOUNTER — Ambulatory Visit: Payer: Medicare Other

## 2019-10-13 DIAGNOSIS — R0602 Shortness of breath: Secondary | ICD-10-CM | POA: Diagnosis not present

## 2019-10-13 DIAGNOSIS — R0609 Other forms of dyspnea: Secondary | ICD-10-CM

## 2019-10-13 DIAGNOSIS — R0600 Dyspnea, unspecified: Secondary | ICD-10-CM

## 2019-10-18 NOTE — Progress Notes (Signed)
Diastolic dysfunction present. Discuss at visit 10/20/2019

## 2019-10-20 ENCOUNTER — Encounter: Payer: Self-pay | Admitting: Nurse Practitioner

## 2019-10-20 ENCOUNTER — Ambulatory Visit
Admission: RE | Admit: 2019-10-20 | Discharge: 2019-10-20 | Disposition: A | Discharge status: Home / Self Care | Payer: Medicare Other | Source: Ambulatory Visit | Attending: Nurse Practitioner | Admitting: Nurse Practitioner

## 2019-10-20 ENCOUNTER — Other Ambulatory Visit: Payer: Self-pay

## 2019-10-20 ENCOUNTER — Ambulatory Visit (INDEPENDENT_AMBULATORY_CARE_PROVIDER_SITE_OTHER): Payer: Medicare Other | Admitting: Nurse Practitioner

## 2019-10-20 ENCOUNTER — Ambulatory Visit
Admission: RE | Admit: 2019-10-20 | Discharge: 2019-10-20 | Disposition: A | Discharge status: Home / Self Care | Payer: Medicare Other | Attending: Nurse Practitioner | Admitting: Nurse Practitioner

## 2019-10-20 VITALS — BP 139/76 | HR 63 | Temp 98.0°F | Resp 16 | Ht 68.0 in | Wt 188.0 lb

## 2019-10-20 DIAGNOSIS — I10 Essential (primary) hypertension: Secondary | ICD-10-CM

## 2019-10-20 DIAGNOSIS — M5000 Cervical disc disorder with myelopathy, unspecified cervical region: Secondary | ICD-10-CM

## 2019-10-20 DIAGNOSIS — M5134 Other intervertebral disc degeneration, thoracic region: Secondary | ICD-10-CM | POA: Diagnosis present

## 2019-10-20 DIAGNOSIS — R0600 Dyspnea, unspecified: Secondary | ICD-10-CM | POA: Diagnosis not present

## 2019-10-20 DIAGNOSIS — F411 Generalized anxiety disorder: Secondary | ICD-10-CM | POA: Diagnosis not present

## 2019-10-20 DIAGNOSIS — R0609 Other forms of dyspnea: Secondary | ICD-10-CM

## 2019-10-20 MED ORDER — DULOXETINE HCL 30 MG PO CPEP: 30.0000 mg | ORAL_CAPSULE | Freq: Every day | ORAL | 2 refills | Status: DC

## 2019-10-20 NOTE — Progress Notes (Signed)
[facility] [place], [place] [address]  Internal MEDICINE  Office Visit Note  Patient Name: Shannon Li  [id number]  [id number]  Date of Service: 10/28/2019  Chief Complaint  Patient presents with  . Follow-up    ECHO  . Hypertension  . Depression  . Gastroesophageal Reflux    The patient is here for follow up of echocardiogram.  She states that she has some trouble with swelling present in her left lower leg, ankle, and foot. This is usually worse after she has been on her feet for a long period of time. She states that sometimes, this gets better at night after resting. Sometimes it does not improve. She also gets some shortness of breath with exertion. Her echo shows normal LVEF with diastolic dysfunction. She has trace mitral and tricuspid regurgitation.  Has chronic upper back and neck pain. This is also worse with any type of exertion. This pain also radiates into the upper back, In between the shoulder blades. She states that she does have degenerative disc disease in her lumbar spine, but thoracic and cervical spine have not been evaluated.  Struggling with anxiety/nervies. States that she has been on current dose of escitalopram for years. She is not sure if this is even working any longer. Can never get her mind to "quiet down." makes it difficult for her to sleep. States that she is always irritable.        Current Medication: Outpatient Encounter Medications as of 10/20/2019  Medication Sig Note  . amLODipine (NORVASC) 2.5 MG tablet Take one tablet by mouth daily.   . bisoprolol-hydrochlorothiazide (ZIAC) 2.5-6.25 MG tablet Take 1 tablet po qd for blood pressure   . busPIRone (BUSPAR) 5 MG tablet Take 1 tablet po two to three times per day prn anxiety 09/21/2019: PRN, hasnt used in 1+ month   . escitalopram (LEXAPRO) 20 MG tablet Take one tablet by mouth daily   . pantoprazole (PROTONIX) 40 MG tablet Take 1 tablet two times daily   .  DULoxetine (CYMBALTA) 30 MG capsule Take 1 capsule (30 mg total) by mouth daily.    No facility-administered encounter medications on file as of 10/20/2019.    Surgical History: Past Surgical History:  Procedure Laterality Date  . ABSCESS DRAINAGE  11/17/13   buttock right  . BREAST BIOPSY Right    neg  . CATARACT EXTRACTION W/PHACO Right 08/17/2019   Procedure: CATARACT EXTRACTION PHACO AND INTRAOCULAR LENS PLACEMENT ([place]) RIGHT VISION BLUE 5.32  00:42.7;  Surgeon: Marchia Meiers, MD;  Location: [facility];  Service: Ophthalmology;  Laterality: Right;  . CATARACT EXTRACTION W/PHACO Left 09/21/2019   Procedure: CATARACT EXTRACTION PHACO AND INTRAOCULAR LENS PLACEMENT ([place]) LEFT VISION BLUE;  Surgeon: Marchia Meiers, MD;  Location: [facility];  Service: Ophthalmology;  Laterality: Left;  6.24 0:40.6  . CHOLECYSTECTOMY  2008  . COLONOSCOPY  2014   [facility] [place]  . COLONOSCOPY WITH PROPOFOL N/A 08/26/2015   Procedure: COLONOSCOPY WITH PROPOFOL;  Surgeon: Hulen Luster, MD;  Location: [facility] ENDOSCOPY;  Service: Gastroenterology;  Laterality: N/A;  . ESOPHAGOGASTRODUODENOSCOPY N/A 11/15/2014   Procedure: ESOPHAGOGASTRODUODENOSCOPY (EGD);  Surgeon: Hulen Luster, MD;  [facility] ENDOSCOPY;  Service: Gastroenterology;  Laterality: N/A;  . UPPER GI ENDOSCOPY  2014   [facility] in [place] History: Past Medical History:  Diagnosis Date  . Anxiety   . Arthritis   . Atrophic kidney   . Chronic constipation   .  Colon polyp 2013  . COPD (chronic obstructive pulmonary disease) ([place])   . Depression   . Diverticulosis   . Esophageal stricture   . Esophagitis   . Fibrocystic breast disease   . GERD (gastroesophageal reflux disease)   . History of hiatal hernia   . Hypertension   . IBS (irritable bowel syndrome)   . Menopause   . Ovarian cyst   . Plantar fasciitis   . Postmenopausal   . Renal artery occlusion ([place])   . Renal insufficiency    Right  kidney is small and not functioning.    Family History: Family History  Problem Relation Age of Onset  . Heart disease Mother   . Heart attack Mother   . Diabetes Mother   . Alzheimer's disease Father   . Breast cancer Sister 54  . Lung cancer Brother   . Brain cancer Brother   . Lung cancer Sister   . Bladder Cancer Sister   . Lung cancer Brother   . Leukemia Brother   . Diabetes Brother     Social History   Socioeconomic History  . Marital status: Widowed    Spouse name: Not on file  . Number of children: Not on file  . Years of education: Not on file  . Highest education level: Not on file  Occupational History  . Not on file  Tobacco Use  . Smoking status: Current Every Day Smoker    Packs/day: 1.00    Years: 30.00    Pack years: 30.00    Types: Cigarettes  . Smokeless tobacco: Never Used  . Tobacco comment: since age 72  Vaping Use  . Vaping Use: Never used  Substance and Sexual Activity  . Alcohol use: No  . Drug use: No  . Sexual activity: Not on file  Other Topics Concern  . Not on file  Social History Narrative  . Not on file   Social Determinants of Health   Financial Resource Strain:   . Difficulty of Paying Living Expenses:   Food Insecurity:   . Worried About Charity fundraiser in the Last Year:   . Arboriculturist in the Last Year:   Transportation Needs:   . Film/video editor (Medical):   Marland Kitchen Lack of Transportation (Non-Medical):   Physical Activity:   . Days of Exercise per Week:   . Minutes of Exercise per Session:   Stress:   . Feeling of Stress :   Social Connections:   . Frequency of Communication with Friends and Family:   . Frequency of Social Gatherings with Friends and Family:   . Attends Religious Services:   . Active Member of Clubs or Organizations:   . Attends Archivist Meetings:   Marland Kitchen Marital Status:   Intimate Partner Violence:   . Fear of Current or Ex-Partner:   . Emotionally Abused:   Marland Kitchen Physically  Abused:   . Sexually Abused:       Review of Systems  Constitutional: Positive for fatigue. Negative for activity change, chills and unexpected weight change.  HENT: Negative for congestion, postnasal drip, rhinorrhea, sinus pressure, sneezing and sore throat.   Respiratory: Positive for shortness of breath. Negative for cough, chest tightness and wheezing.   Cardiovascular: Positive for leg swelling. Negative for chest pain and palpitations.  Gastrointestinal: Negative for abdominal pain, constipation, diarrhea, nausea and vomiting.  Endocrine: Negative for cold intolerance, heat intolerance, polydipsia and polyuria.  Musculoskeletal: Positive for arthralgias, back  pain, myalgias, neck pain and neck stiffness. Negative for joint swelling.  Skin: Negative for rash.  Allergic/Immunologic: Positive for environmental allergies.  Neurological: Positive for headaches. Negative for tremors and numbness.  Hematological: Negative for adenopathy. Does not bruise/bleed easily.  Psychiatric/Behavioral: Positive for dysphoric mood. Negative for behavioral problems (Depression), sleep disturbance and suicidal ideas. The patient is nervous/anxious.        Feels like she is struggling with anxiety/depression. Unsure if escitalopram is even working for any longer. Addition of buspirone has done no good.     Today's Vitals   10/20/19 1003  BP: 139/76  Pulse: 63  Resp: 16  Temp: 98 F (36.7 C)  SpO2: 95%  Weight: 188 lb (85.3 kg)  Height: 5\' 8"  (1.727 m)   Body mass index is 28.59 kg/m.  Physical Exam Vitals and nursing note reviewed.  Constitutional:      General: She is not in acute distress.    Appearance: Normal appearance. She is well-developed. She is not diaphoretic.  HENT:     Head: Normocephalic and atraumatic.     Mouth/Throat:     Pharynx: No oropharyngeal exudate.  Eyes:     Pupils: Pupils are equal, round, and reactive to light.  Neck:     Thyroid: No thyromegaly.      Vascular: No carotid bruit or JVD.     Trachea: No tracheal deviation.  Cardiovascular:     Rate and Rhythm: Normal rate and regular rhythm.     Heart sounds: Normal heart sounds. No murmur heard.  No friction rub. No gallop.   Pulmonary:     Effort: Pulmonary effort is normal. No respiratory distress.     Breath sounds: Normal breath sounds. No wheezing or rales.  Chest:     Chest wall: No tenderness.  Abdominal:     Palpations: Abdomen is soft.     Tenderness: There is no abdominal tenderness. There is no guarding.  Musculoskeletal:        General: Normal range of motion.     Cervical back: Normal range of motion and neck supple.  Lymphadenopathy:     Cervical: No cervical adenopathy.  Skin:    General: Skin is warm and dry.  Neurological:     Mental Status: She is alert and oriented to person, place, and time.     Cranial Nerves: No cranial nerve deficit.  Psychiatric:        Attention and Perception: Attention and perception normal.        Mood and Affect: Mood is anxious and depressed.        Speech: Speech normal.        Behavior: Behavior normal. Behavior is cooperative.        Thought Content: Thought content normal.        Cognition and Memory: Cognition and memory normal.        Judgment: Judgment normal.    Assessment/Plan: 1. Cervical disc disease with myelopathy Will get x-ray of cervical spine for further evaluation. Refer to orthopedics/neurosurgery as indicated. - DG Cervical Spine Complete; Future  2. Degenerative disc disease, thoracic X-ray of thoracic spine for further evaluation. Refer to orthopedics/neurolsurgery as indicated.  - DG Thoracic Spine 2 View; Future  3. Essential hypertension Stable. Continue bp medication as prescribed   4. Dyspnea on exertion Reviewed results of echocardiogram with patient. Her echo shows normal LVEF with diastolic dysfunction. She has trace mitral and tricuspid regurgitation. She has been evaluated for sleep apnea  in the past and results were negative.   5. Generalized anxiety disorder Start duloxetine 30mg  daily. Advised her to gradually wean off escitalopram. Written instructions provided. Patient voiced understanding of the directions.  - DULoxetine (CYMBALTA) 30 MG capsule; Take 1 capsule (30 mg total) by mouth daily.  Dispense: 30 capsule; Refill: 2  General Counseling: dwanda tufano understanding of the findings of todays visit and agrees with plan of treatment. I have discussed any further diagnostic evaluation that may be needed or ordered today. We also reviewed her medications today. she has been encouraged to call the office with any questions or concerns that should arise related to todays visit.  This patient was seen by Leretha Pol FNP Collaboration with Dr Lavera Guise as a part of collaborative care agreement  Orders Placed This Encounter  Procedures  . DG Cervical Spine Complete  . DG Thoracic Spine 2 View    Meds ordered this encounter  Medications  . DULoxetine (CYMBALTA) 30 MG capsule    Sig: Take 1 capsule (30 mg total) by mouth daily.    Dispense:  30 capsule    Refill:  2    Patient given written instructions to wean off and stop lexapro.    Order Specific Question:   Supervising Provider    Answer:   Lavera Guise [[id number]]    Total time spent: 35 Minutes   Time spent includes review of chart, medications, test results, and follow up plan with the patient.      Dr Lavera Guise Internal medicine

## 2019-10-28 DIAGNOSIS — M5000 Cervical disc disorder with myelopathy, unspecified cervical region: Secondary | ICD-10-CM | POA: Insufficient documentation

## 2019-10-28 DIAGNOSIS — M5134 Other intervertebral disc degeneration, thoracic region: Secondary | ICD-10-CM | POA: Insufficient documentation

## 2019-11-07 DIAGNOSIS — H26491 Other secondary cataract, right eye: Secondary | ICD-10-CM | POA: Diagnosis not present

## 2019-11-11 ENCOUNTER — Other Ambulatory Visit: Payer: Self-pay | Admitting: Nurse Practitioner

## 2019-11-11 DIAGNOSIS — F411 Generalized anxiety disorder: Secondary | ICD-10-CM

## 2019-11-28 ENCOUNTER — Telehealth: Payer: Self-pay

## 2019-11-28 NOTE — Telephone Encounter (Signed)
Confirmed and screened for 12-01-19 ov. 

## 2019-12-01 ENCOUNTER — Ambulatory Visit (INDEPENDENT_AMBULATORY_CARE_PROVIDER_SITE_OTHER): Payer: Medicare Other | Admitting: Nurse Practitioner

## 2019-12-01 ENCOUNTER — Encounter: Payer: Self-pay | Admitting: Nurse Practitioner

## 2019-12-01 ENCOUNTER — Other Ambulatory Visit: Payer: Self-pay

## 2019-12-01 VITALS — BP 157/74 | HR 62 | Temp 97.6°F | Resp 16 | Ht 68.0 in | Wt 190.2 lb

## 2019-12-01 DIAGNOSIS — M5000 Cervical disc disorder with myelopathy, unspecified cervical region: Secondary | ICD-10-CM

## 2019-12-01 DIAGNOSIS — F411 Generalized anxiety disorder: Secondary | ICD-10-CM | POA: Diagnosis not present

## 2019-12-01 DIAGNOSIS — J449 Chronic obstructive pulmonary disease, unspecified: Secondary | ICD-10-CM | POA: Diagnosis not present

## 2019-12-01 DIAGNOSIS — I10 Essential (primary) hypertension: Secondary | ICD-10-CM | POA: Diagnosis not present

## 2019-12-01 NOTE — Progress Notes (Signed)
[facility] [place], [place] [address]  Internal MEDICINE  Office Visit Note  Patient Name: Shannon Li  [id number]  [id number]  Date of Service: 12/17/2019  Chief Complaint  Patient presents with  . Follow-up    review labs  . Hypertension  . Depression  . Quality Metric Gaps    Tdap    The patient is here for follow up visit. Was changed from lexapro to duloxetine at her last visit due to worsening depression. She states that she does feel better since starting on the duloxetine. She is less irritable, crying less often, and states that she is sleeping better. She also states that her chronic back and joint pain have improved since starting duloxetine. Has has taken the buspirone 5mg  tablets twice daily when needed for acute anxiety and she states that this has also helped. She reports no negative side effects from taking this new medication and would like to continue.       Current Medication: Outpatient Encounter Medications as of 12/01/2019  Medication Sig Note  . amLODipine (NORVASC) 2.5 MG tablet Take one tablet by mouth daily.   . bisoprolol-hydrochlorothiazide (ZIAC) 2.5-6.25 MG tablet Take 1 tablet po qd for blood pressure   . busPIRone (BUSPAR) 5 MG tablet Take 1 tablet po two to three times per day prn anxiety 09/21/2019: PRN, hasnt used in 1+ month   . DULoxetine (CYMBALTA) 30 MG capsule TAKE 1 CAPSULE BY MOUTH EVERY DAY   . pantoprazole (PROTONIX) 40 MG tablet Take 1 tablet two times daily   . [DISCONTINUED] escitalopram (LEXAPRO) 20 MG tablet Take one tablet by mouth daily    No facility-administered encounter medications on file as of 12/01/2019.    Surgical History: Past Surgical History:  Procedure Laterality Date  . ABSCESS DRAINAGE  11/17/13   buttock right  . BREAST BIOPSY Right    neg  . CATARACT EXTRACTION W/PHACO Right 08/17/2019   Procedure: CATARACT EXTRACTION PHACO AND INTRAOCULAR LENS PLACEMENT ([place]) RIGHT VISION BLUE  5.32  00:42.7;  Surgeon: Marchia Meiers, MD;  Location: [facility];  Service: Ophthalmology;  Laterality: Right;  . CATARACT EXTRACTION W/PHACO Left 09/21/2019   Procedure: CATARACT EXTRACTION PHACO AND INTRAOCULAR LENS PLACEMENT ([place]) LEFT VISION BLUE;  Surgeon: Marchia Meiers, MD;  Location: [facility];  Service: Ophthalmology;  Laterality: Left;  6.24 0:40.6  . CHOLECYSTECTOMY  2008  . COLONOSCOPY  2014   [facility] [place]  . COLONOSCOPY WITH PROPOFOL N/A 08/26/2015   Procedure: COLONOSCOPY WITH PROPOFOL;  Surgeon: Hulen Luster, MD;  Location: [facility] ENDOSCOPY;  Service: Gastroenterology;  Laterality: N/A;  . ESOPHAGOGASTRODUODENOSCOPY N/A 11/15/2014   Procedure: ESOPHAGOGASTRODUODENOSCOPY (EGD);  Surgeon: Hulen Luster, MD;  Location: [facility] ENDOSCOPY;  Service: Gastroenterology;  Laterality: N/A;  . UPPER GI ENDOSCOPY  2014   [facility] [place] History: Past Medical History:  Diagnosis Date  . Anxiety   . Arthritis   . Atrophic kidney   . Chronic constipation   . Colon polyp 2013  . COPD (chronic obstructive pulmonary disease) ([place])   . Depression   . Diverticulosis   . Esophageal stricture   . Esophagitis   . Fibrocystic breast disease   . GERD (gastroesophageal reflux disease)   . History of hiatal hernia   . Hypertension   . IBS (irritable bowel syndrome)   . Menopause   . Ovarian cyst   . Plantar fasciitis   . Postmenopausal   .  Renal artery occlusion ([place])   . Renal insufficiency    Right kidney is small and not functioning.    Family History: Family History  Problem Relation Age of Onset  . Heart disease Mother   . Heart attack Mother   . Diabetes Mother   . Alzheimer's disease Father   . Breast cancer Sister 11  . Lung cancer Brother   . Brain cancer Brother   . Lung cancer Sister   . Bladder Cancer Sister   . Lung cancer Brother   . Leukemia Brother   . Diabetes Brother     Social History   Socioeconomic  History  . Marital status: Widowed    Spouse name: Not on file  . Number of children: Not on file  . Years of education: Not on file  . Highest education level: Not on file  Occupational History  . Not on file  Tobacco Use  . Smoking status: Current Every Day Smoker    Packs/day: 1.00    Years: 30.00    Pack years: 30.00    Types: Cigarettes  . Smokeless tobacco: Never Used  . Tobacco comment: since age 42  Vaping Use  . Vaping Use: Never used  Substance and Sexual Activity  . Alcohol use: No  . Drug use: No  . Sexual activity: Not on file  Other Topics Concern  . Not on file  Social History Narrative  . Not on file   Social Determinants of Health   Financial Resource Strain:   . Difficulty of Paying Living Expenses: Not on file  Food Insecurity:   . Worried About Charity fundraiser in the Last Year: Not on file  . Ran Out of Food in the Last Year: Not on file  Transportation Needs:   . Lack of Transportation (Medical): Not on file  . Lack of Transportation (Non-Medical): Not on file  Physical Activity:   . Days of Exercise per Week: Not on file  . Minutes of Exercise per Session: Not on file  Stress:   . Feeling of Stress : Not on file  Social Connections:   . Frequency of Communication with Friends and Family: Not on file  . Frequency of Social Gatherings with Friends and Family: Not on file  . Attends Religious Services: Not on file  . Active Member of Clubs or Organizations: Not on file  . Attends Archivist Meetings: Not on file  . Marital Status: Not on file  Intimate Partner Violence:   . Fear of Current or Ex-Partner: Not on file  . Emotionally Abused: Not on file  . Physically Abused: Not on file  . Sexually Abused: Not on file      Review of Systems  Constitutional: Positive for fatigue. Negative for activity change, chills and unexpected weight change.  HENT: Negative for congestion, postnasal drip, rhinorrhea, sinus pressure, sneezing  and sore throat.   Respiratory: Positive for shortness of breath. Negative for cough, chest tightness and wheezing.   Cardiovascular: Positive for leg swelling. Negative for chest pain and palpitations.  Gastrointestinal: Negative for abdominal pain, constipation, diarrhea, nausea and vomiting.  Endocrine: Negative for cold intolerance, heat intolerance, polydipsia and polyuria.  Musculoskeletal: Positive for arthralgias, back pain, myalgias, neck pain and neck stiffness. Negative for joint swelling.       Improved back and joint pain since starting on duloxetine.   Skin: Negative for rash.  Allergic/Immunologic: Positive for environmental allergies.  Neurological: Positive for headaches. Negative  for tremors and numbness.  Hematological: Negative for adenopathy. Does not bruise/bleed easily.  Psychiatric/Behavioral: Positive for dysphoric mood. Negative for behavioral problems (Depression), sleep disturbance and suicidal ideas. The patient is nervous/anxious.        Improved anxiety/depression since starting on duloxetine.     Today's Vitals   12/01/19 0926  BP: (!) 157/74  Pulse: 62  Resp: 16  Temp: 97.6 F (36.4 C)  SpO2: 94%  Weight: 190 lb 3.2 oz (86.3 kg)  Height: 5\' 8"  (1.727 m)   Body mass index is 28.92 kg/m.  Physical Exam Vitals and nursing note reviewed.  Constitutional:      General: She is not in acute distress.    Appearance: Normal appearance. She is well-developed. She is not diaphoretic.  HENT:     Head: Normocephalic and atraumatic.     Nose: Nose normal.     Mouth/Throat:     Pharynx: No oropharyngeal exudate.  Eyes:     Pupils: Pupils are equal, round, and reactive to light.  Neck:     Thyroid: No thyromegaly.     Vascular: No carotid bruit or JVD.     Trachea: No tracheal deviation.  Cardiovascular:     Rate and Rhythm: Normal rate and regular rhythm.     Heart sounds: Normal heart sounds. No murmur heard.  No friction rub. No gallop.    Pulmonary:     Effort: Pulmonary effort is normal. No respiratory distress.     Breath sounds: Normal breath sounds. No wheezing or rales.  Chest:     Chest wall: No tenderness.  Abdominal:     Palpations: Abdomen is soft.     Tenderness: There is no abdominal tenderness. There is no guarding.  Musculoskeletal:        General: Normal range of motion.     Cervical back: Normal range of motion and neck supple.     Comments: Improved generalized joint and muscle pain since starting duloxetine.   Lymphadenopathy:     Cervical: No cervical adenopathy.  Skin:    General: Skin is warm and dry.  Neurological:     Mental Status: She is alert and oriented to person, place, and time.     Cranial Nerves: No cranial nerve deficit.  Psychiatric:        Attention and Perception: Attention and perception normal.        Mood and Affect: Mood is anxious and depressed.        Speech: Speech normal.        Behavior: Behavior normal. Behavior is cooperative.        Thought Content: Thought content normal.        Cognition and Memory: Cognition and memory normal.        Judgment: Judgment normal.     Comments: Improved anxiety and depression since starting on duloxetine.     Assessment/Plan: 1. Essential hypertension Mildly elevated today, but generally stable. Continue bp medication as prescribed   2. Chronic obstructive pulmonary disease, unspecified COPD type ([place]) Stable. Continue inhalers and respiratory medication as prescribed.  3. Cervical disc disease with myelopathy Improved pain since starting on duloxetine. Continue as prescribed.   4. Generalized anxiety disorder Improved. Continue duloxetine daily. May take buspirone as needed and as prescribed for acute anxiety.   General Counseling: nica friske understanding of the findings of todays visit and agrees with plan of treatment. I have discussed any further diagnostic evaluation that may be needed or ordered  today. We also  reviewed her medications today. she has been encouraged to call the office with any questions or concerns that should arise related to todays visit.   This patient was seen by Leretha Pol FNP Collaboration with Dr Lavera Guise as a part of collaborative care agreement   Total time spent: 25 Minutes   Time spent includes review of chart, medications, test results, and follow up plan with the patient.      Dr Lavera Guise Internal medicine

## 2020-03-04 ENCOUNTER — Other Ambulatory Visit: Payer: Self-pay

## 2020-03-04 ENCOUNTER — Telehealth: Payer: Self-pay

## 2020-03-04 ENCOUNTER — Ambulatory Visit (INDEPENDENT_AMBULATORY_CARE_PROVIDER_SITE_OTHER): Payer: Medicare Other | Admitting: Nurse Practitioner

## 2020-03-04 ENCOUNTER — Encounter: Payer: Self-pay | Admitting: Nurse Practitioner

## 2020-03-04 VITALS — BP 142/78 | HR 84 | Temp 97.5°F | Resp 16 | Ht 68.0 in | Wt 190.4 lb

## 2020-03-04 DIAGNOSIS — I10 Essential (primary) hypertension: Secondary | ICD-10-CM | POA: Diagnosis not present

## 2020-03-04 DIAGNOSIS — Z23 Encounter for immunization: Secondary | ICD-10-CM | POA: Diagnosis not present

## 2020-03-04 DIAGNOSIS — K219 Gastro-esophageal reflux disease without esophagitis: Secondary | ICD-10-CM | POA: Diagnosis not present

## 2020-03-04 DIAGNOSIS — F411 Generalized anxiety disorder: Secondary | ICD-10-CM | POA: Diagnosis not present

## 2020-03-04 MED ORDER — DULOXETINE HCL 30 MG PO CPEP: ORAL_CAPSULE | ORAL | 5 refills | Status: DC

## 2020-03-04 NOTE — Telephone Encounter (Signed)
[facility] - [facility] app[facility] , no food or drink after midnight

## 2020-03-04 NOTE — Progress Notes (Signed)
[facility] [place], [place] [address]  Internal MEDICINE  Office Visit Note  Patient Name: Shannon Li  [id number]  [id number]  Date of Service: 03/04/2020  Chief Complaint  Patient presents with   Follow-up   Depression   Gastroesophageal Reflux   Hypertension   Quality Metric Gaps    flu,tetnaus,covid   controlled substance form    reviewed with PT    The patient is here for follow up visit. She states that she is having increased problem with epigastric pain and indigestion. Is taking protonix twice daily and often has to take tums in between. When this acts up, will last for several days before it resolves. Has to be very careful of what and when she eats. Reviewed previous studies she has had. She had endoscopy done 07/10/2015 which showed a small hiatal hernia and a single, non-bleeding angiodysplastic lesion in duodenum. A CT of her abdomen and pelvis was done in 2019 showed previous gallbladder removal. There was diverticulosis present in sigmoid colon. She has severe and stable right renal atrophy. Aortic atherosclerosis was present. She did have colonoscopy done 08/26/2015 which showed diverticulosis in sigmoid colon with no other abnormalities.       Current Medication: Outpatient Encounter Medications as of 03/04/2020  Medication Sig Note   amLODipine (NORVASC) 2.5 MG tablet Take one tablet by mouth daily.    bisoprolol-hydrochlorothiazide (ZIAC) 2.5-6.25 MG tablet Take 1 tablet po qd for blood pressure    busPIRone (BUSPAR) 5 MG tablet Take 1 tablet po two to three times per day prn anxiety 09/21/2019: PRN, hasnt used in 1+ month    DULoxetine (CYMBALTA) 30 MG capsule TAKE 1 CAPSULE BY MOUTH EVERY DAY    pantoprazole (PROTONIX) 40 MG tablet Take 1 tablet two times daily    [DISCONTINUED] DULoxetine (CYMBALTA) 30 MG capsule TAKE 1 CAPSULE BY MOUTH EVERY DAY    No facility-administered encounter medications on file as of  03/04/2020.    Surgical History: Past Surgical History:  Procedure Laterality Date   ABSCESS DRAINAGE  11/17/13   buttock right   BREAST BIOPSY Right    neg   CATARACT EXTRACTION W/PHACO Right 08/17/2019   Procedure: CATARACT EXTRACTION PHACO AND INTRAOCULAR LENS PLACEMENT (IOC) RIGHT VISION BLUE 5.32  00:42.7;  Surgeon: Marchia Meiers, MD;  Location: [facility];  Service: Ophthalmology;  Laterality: Right;   CATARACT EXTRACTION W/PHACO Left 09/21/2019   Procedure: CATARACT EXTRACTION PHACO AND INTRAOCULAR LENS PLACEMENT ([place]) LEFT VISION BLUE;  Surgeon: Marchia Meiers, MD;  Location: [facility];  Service: Ophthalmology;  Laterality: Left;  6.24 0:40.6   CHOLECYSTECTOMY  2008   COLONOSCOPY  2014   [facility] [place] N/A 08/26/2015   Procedure: COLONOSCOPY WITH PROPOFOL;  Surgeon: Hulen Luster, MD;  Location: [facility];  Service: Gastroenterology;  Laterality: N/A;   ESOPHAGOGASTRODUODENOSCOPY N/A 11/15/2014   Procedure: ESOPHAGOGASTRODUODENOSCOPY (EGD);  Surgeon: Hulen Luster, MD;  Location: [facility] END[facility];  Service: Gastroenterology;  Laterality: N/A;   UPPER GI ENDOSCOPY  2014   [facility] [place] History: Past Medical History:  Diagnosis Date   Anxiety    Arthritis    Atrophic kidney    Chronic constipation    Colon polyp 2013   COPD (chronic obstructive pulmonary disease) (HCC)    Depression    Diverticulosis    Esophageal stricture    Esophagitis    Fibrocystic breast disease  GERD (gastroesophageal reflux disease)    History of hiatal hernia    Hypertension    IBS (irritable bowel syndrome)    Menopause    Ovarian cyst    Plantar fasciitis    Postmenopausal    Renal artery occlusion (HCC)    Renal insufficiency    Right kidney is small and not functioning.    Family History: Family History  Problem Relation Age of Onset   Heart disease Mother    Heart  attack Mother    Diabetes Mother    Alzheimer's disease Father    Breast cancer Sister 26   Lung cancer Brother    Brain cancer Brother    Lung cancer Sister    Bladder Cancer Sister    Lung cancer Brother    Leukemia Brother    Diabetes Brother     Social History   Socioeconomic History   Marital status: Widowed    Spouse name: Not on file   Number of children: Not on file   Years of education: Not on file   Highest education level: Not on file  Occupational History   Not on file  Tobacco Use   Smoking status: Current Every Day Smoker    Packs/day: 1.00    Years: 30.00    Pack years: 30.00    Types: Cigarettes   Smokeless tobacco: Never Used   Tobacco comment: since age 76  Vaping Use   Vaping Use: Never used  Substance and Sexual Activity   Alcohol use: No   Drug use: No   Sexual activity: Not on file  Other Topics Concern   Not on file  Social History Narrative   Not on file   Social Determinants of Health   Financial Resource Strain:    Difficulty of Paying Living Expenses: Not on file  Food Insecurity:    Worried About [place] in the Last Year: Not on file   [employer] of Food in the Last Year: Not on file  Transportation Needs:    Lack of Transportation (Medical): Not on file   Lack of Transportation (Non-Medical): Not on file  Physical Activity:    Days of Exercise per Week: Not on file   Minutes of Exercise per Session: Not on file  Stress:    Feeling of Stress : Not on file  Social Connections:    Frequency of Communication with Friends and Family: Not on file   Frequency of Social Gatherings with Friends and Family: Not on file   Attends Religious Services: Not on file   Active Member of Clubs or Organizations: Not on file   Attends Archivist Meetings: Not on file   Marital Status: Not on file  Intimate Partner Violence:    Fear of Current or Ex-Partner: Not on file   Emotionally  Abused: Not on file   Physically Abused: Not on file   Sexually Abused: Not on file      Review of Systems  Constitutional: Positive for appetite change. Negative for chills, fatigue and unexpected weight change.  HENT: Negative for congestion, postnasal drip, rhinorrhea, sneezing and sore throat.   Respiratory: Negative for cough, chest tightness and shortness of breath.   Cardiovascular: Negative for chest pain and palpitations.  Gastrointestinal: Negative for abdominal pain, constipation, diarrhea, nausea and vomiting.       Increased epigastric pain. Worse with eating. Frequently feels bloated.   Endocrine: Negative for cold intolerance, heat intolerance, polydipsia and polyuria.  Musculoskeletal: Negative for arthralgias, back pain, joint swelling and neck pain.  Skin: Negative for rash.  Allergic/Immunologic: Positive for environmental allergies.  Neurological: Negative for dizziness, tremors, numbness and headaches.  Hematological: Negative for adenopathy. Does not bruise/bleed easily.  Psychiatric/Behavioral: Positive for dysphoric mood. Negative for behavioral problems (Depression), sleep disturbance and suicidal ideas. The patient is nervous/anxious.     Today's Vitals   03/04/20 0909  BP: (!) 142/78  Pulse: 84  Resp: 16  Temp: (!) 97.5 F (36.4 C)  SpO2: 93%  Weight: 190 lb 6.4 oz (86.4 kg)  Height: 5\' 8"  (1.727 m)   Body mass index is 28.95 kg/m.  Physical Exam Vitals and nursing note reviewed.  Constitutional:      General: She is not in acute distress.    Appearance: Normal appearance. She is well-developed. She is not diaphoretic.  HENT:     Head: Normocephalic and atraumatic.     Nose: Nose normal.     Mouth/Throat:     Pharynx: No oropharyngeal exudate.  Eyes:     Pupils: Pupils are equal, round, and reactive to light.  Neck:     Thyroid: No thyromegaly.     Vascular: No carotid bruit or JVD.     Trachea: No tracheal deviation.  Cardiovascular:      Rate and Rhythm: Normal rate and regular rhythm.     Heart sounds: Normal heart sounds. No murmur heard.  No friction rub. No gallop.   Pulmonary:     Effort: Pulmonary effort is normal. No respiratory distress.     Breath sounds: Normal breath sounds. No wheezing or rales.  Chest:     Chest wall: No tenderness.  Abdominal:     General: Bowel sounds are normal.     Palpations: Abdomen is soft.     Tenderness: There is abdominal tenderness.     Comments: Mild, epigastric tenderness.  Musculoskeletal:        General: Normal range of motion.     Cervical back: Normal range of motion and neck supple.  Lymphadenopathy:     Cervical: No cervical adenopathy.  Skin:    General: Skin is warm and dry.  Neurological:     General: No focal deficit present.     Mental Status: She is alert and oriented to person, place, and time.     Cranial Nerves: No cranial nerve deficit.  Psychiatric:        Mood and Affect: Mood normal.        Behavior: Behavior normal.        Thought Content: Thought content normal.        Judgment: Judgment normal.   Assessment/Plan: 1. Gastroesophageal reflux disease without esophagitis Patient should conitnue pantoprazole twice daily as prescribed. Will get UGI for further evaluation.  - DG UGI W SMALL BOWEL; Future  2. Generalized anxiety disorder Doing well. Continue cymbalta as prescribed  - DULoxetine (CYMBALTA) 30 MG capsule; TAKE 1 CAPSULE BY MOUTH EVERY DAY  Dispense: 30 capsule; Refill: 5  3. Essential hypertension Stable. Continue bp medication as prescribed   4. Needs flu shot Flu shot administered in the office today.  - Flu Vaccine MDCK QUAD PF  General Counseling: coretta leisey understanding of the findings of todays visit and agrees with plan of treatment. I have discussed any further diagnostic evaluation that may be needed or ordered today. We also reviewed her medications today. she has been encouraged to call the office with any  questions or  concerns that should arise related to todays visit.  This patient was seen by [place] with Dr Lavera Guise as a part of collaborative care agreement  Orders Placed This Encounter  Procedures   DG UGI W SMALL BOWEL   Flu Vaccine MDCK QUAD PF    Meds ordered this encounter  Medications   DULoxetine (CYMBALTA) 30 MG capsule    Sig: TAKE 1 CAPSULE BY MOUTH EVERY DAY    Dispense:  30 capsule    Refill:  5    Order Specific Question:   Supervising Provider    Answer:   Lavera Guise [[id number]]    Total time spent: 30 Minutes   Time spent includes review of chart, medications, test results, and follow up plan with the patient.      Dr Lavera Guise Internal medicine

## 2020-03-07 ENCOUNTER — Ambulatory Visit
Admission: RE | Admit: 2020-03-07 | Discharge: 2020-03-07 | Disposition: A | Discharge status: Home / Self Care | Payer: Medicare Other | Source: Ambulatory Visit | Attending: Nurse Practitioner | Admitting: Nurse Practitioner

## 2020-03-07 ENCOUNTER — Other Ambulatory Visit: Payer: Self-pay

## 2020-03-07 DIAGNOSIS — K219 Gastro-esophageal reflux disease without esophagitis: Secondary | ICD-10-CM | POA: Insufficient documentation

## 2020-03-07 DIAGNOSIS — R1013 Epigastric pain: Secondary | ICD-10-CM | POA: Diagnosis not present

## 2020-03-19 ENCOUNTER — Other Ambulatory Visit: Payer: Self-pay

## 2020-03-19 DIAGNOSIS — K219 Gastro-esophageal reflux disease without esophagitis: Secondary | ICD-10-CM

## 2020-03-19 DIAGNOSIS — I10 Essential (primary) hypertension: Secondary | ICD-10-CM

## 2020-03-19 MED ORDER — BISOPROLOL-HYDROCHLOROTHIAZIDE 2.5-6.25 MG PO TABS: ORAL_TABLET | ORAL | 1 refills | Status: DC

## 2020-03-19 MED ORDER — PANTOPRAZOLE SODIUM 40 MG PO TBEC: DELAYED_RELEASE_TABLET | ORAL | 1 refills | Status: DC

## 2020-03-19 MED ORDER — AMLODIPINE BESYLATE 2.5 MG PO TABS: ORAL_TABLET | ORAL | 1 refills | Status: DC

## 2020-03-28 DIAGNOSIS — C44622 Squamous cell carcinoma of skin of right upper limb, including shoulder: Secondary | ICD-10-CM | POA: Diagnosis not present

## 2020-03-28 DIAGNOSIS — D2271 Melanocytic nevi of right lower limb, including hip: Secondary | ICD-10-CM | POA: Diagnosis not present

## 2020-03-28 DIAGNOSIS — Z85828 Personal history of other malignant neoplasm of skin: Secondary | ICD-10-CM | POA: Diagnosis not present

## 2020-03-28 DIAGNOSIS — D225 Melanocytic nevi of trunk: Secondary | ICD-10-CM | POA: Diagnosis not present

## 2020-03-28 DIAGNOSIS — D2262 Melanocytic nevi of left upper limb, including shoulder: Secondary | ICD-10-CM | POA: Diagnosis not present

## 2020-03-28 DIAGNOSIS — D2261 Melanocytic nevi of right upper limb, including shoulder: Secondary | ICD-10-CM | POA: Diagnosis not present

## 2020-03-28 DIAGNOSIS — L570 Actinic keratosis: Secondary | ICD-10-CM | POA: Diagnosis not present

## 2020-03-28 DIAGNOSIS — D485 Neoplasm of uncertain behavior of skin: Secondary | ICD-10-CM | POA: Diagnosis not present

## 2020-03-28 DIAGNOSIS — D0462 Carcinoma in situ of skin of left upper limb, including shoulder: Secondary | ICD-10-CM | POA: Diagnosis not present

## 2020-03-28 DIAGNOSIS — X32XXXA Exposure to sunlight, initial encounter: Secondary | ICD-10-CM | POA: Diagnosis not present

## 2020-04-04 ENCOUNTER — Ambulatory Visit: Payer: Medicare Other | Admitting: Nurse Practitioner

## 2020-04-24 DIAGNOSIS — C44622 Squamous cell carcinoma of skin of right upper limb, including shoulder: Secondary | ICD-10-CM | POA: Diagnosis not present

## 2020-05-08 DIAGNOSIS — D0462 Carcinoma in situ of skin of left upper limb, including shoulder: Secondary | ICD-10-CM | POA: Diagnosis not present

## 2020-08-21 DIAGNOSIS — M8588 Other specified disorders of bone density and structure, other site: Secondary | ICD-10-CM | POA: Diagnosis not present

## 2020-08-22 DIAGNOSIS — D2261 Melanocytic nevi of right upper limb, including shoulder: Secondary | ICD-10-CM | POA: Diagnosis not present

## 2020-08-22 DIAGNOSIS — X32XXXA Exposure to sunlight, initial encounter: Secondary | ICD-10-CM | POA: Diagnosis not present

## 2020-08-22 DIAGNOSIS — Z85828 Personal history of other malignant neoplasm of skin: Secondary | ICD-10-CM | POA: Diagnosis not present

## 2020-08-22 DIAGNOSIS — L570 Actinic keratosis: Secondary | ICD-10-CM | POA: Diagnosis not present

## 2020-08-22 DIAGNOSIS — D225 Melanocytic nevi of trunk: Secondary | ICD-10-CM | POA: Diagnosis not present

## 2020-08-22 DIAGNOSIS — D2272 Melanocytic nevi of left lower limb, including hip: Secondary | ICD-10-CM | POA: Diagnosis not present

## 2020-08-31 ENCOUNTER — Other Ambulatory Visit: Payer: Self-pay | Admitting: Nurse Practitioner

## 2020-08-31 DIAGNOSIS — F411 Generalized anxiety disorder: Secondary | ICD-10-CM

## 2020-09-04 ENCOUNTER — Telehealth: Payer: Self-pay | Admitting: Internal Medicine

## 2020-09-04 NOTE — Progress Notes (Signed)
  Chronic Care Management   Note  09/04/2020 Name: ANALYN MATUSEK MRN: [id number] DOB: 1951/04/03  ISADORE BOKHARI is a 70 y.o. year old female who is a primary care patient of Lavera Guise, MD. I reached out to [name] by phone today in response to a referral sent by Ms. Gerrit Heck PCP, Lavera Guise, MD.   Ms. Groseclose was given information about Chronic Care Management services today including:  1. CCM service includes personalized support from designated clinical staff supervised by her physician, including individualized plan of care and coordination with other care providers 2. 24/7 contact phone numbers for assistance for urgent and routine care needs. 3. Service will only be billed when office clinical staff spend 20 minutes or more in a month to coordinate care. 4. Only one practitioner may furnish and bill the service in a calendar month. 5. The patient may stop CCM services at any time (effective at the end of the month) by phone call to the office staff.   Patient agreed to services and verbal consent obtained.   Follow up plan:   [place]

## 2020-09-09 ENCOUNTER — Encounter: Payer: Self-pay | Admitting: *Deleted

## 2020-09-09 ENCOUNTER — Other Ambulatory Visit: Payer: Self-pay

## 2020-09-09 ENCOUNTER — Encounter: Payer: Self-pay | Admitting: Physician Assistant

## 2020-09-09 ENCOUNTER — Ambulatory Visit (INDEPENDENT_AMBULATORY_CARE_PROVIDER_SITE_OTHER): Payer: Medicare Other | Admitting: Physician Assistant

## 2020-09-09 DIAGNOSIS — E559 Vitamin D deficiency, unspecified: Secondary | ICD-10-CM | POA: Diagnosis not present

## 2020-09-09 DIAGNOSIS — K219 Gastro-esophageal reflux disease without esophagitis: Secondary | ICD-10-CM

## 2020-09-09 DIAGNOSIS — R300 Dysuria: Secondary | ICD-10-CM | POA: Diagnosis not present

## 2020-09-09 DIAGNOSIS — E782 Mixed hyperlipidemia: Secondary | ICD-10-CM

## 2020-09-09 DIAGNOSIS — Z124 Encounter for screening for malignant neoplasm of cervix: Secondary | ICD-10-CM | POA: Diagnosis not present

## 2020-09-09 DIAGNOSIS — Z23 Encounter for immunization: Secondary | ICD-10-CM

## 2020-09-09 DIAGNOSIS — F17219 Nicotine dependence, cigarettes, with unspecified nicotine-induced disorders: Secondary | ICD-10-CM | POA: Diagnosis not present

## 2020-09-09 DIAGNOSIS — Z1231 Encounter for screening mammogram for malignant neoplasm of breast: Secondary | ICD-10-CM | POA: Diagnosis not present

## 2020-09-09 DIAGNOSIS — R5383 Other fatigue: Secondary | ICD-10-CM

## 2020-09-09 DIAGNOSIS — Z122 Encounter for screening for malignant neoplasm of respiratory organs: Secondary | ICD-10-CM | POA: Diagnosis not present

## 2020-09-09 DIAGNOSIS — Z0001 Encounter for general adult medical examination with abnormal findings: Secondary | ICD-10-CM | POA: Diagnosis not present

## 2020-09-09 DIAGNOSIS — I10 Essential (primary) hypertension: Secondary | ICD-10-CM | POA: Diagnosis not present

## 2020-09-09 DIAGNOSIS — R1013 Epigastric pain: Secondary | ICD-10-CM | POA: Diagnosis not present

## 2020-09-09 DIAGNOSIS — F411 Generalized anxiety disorder: Secondary | ICD-10-CM

## 2020-09-09 NOTE — Progress Notes (Signed)
[facility] [place], [place] [address]  Internal MEDICINE  Office Visit Note  Patient Name: Shannon Li  [id number]  [id number]  Date of Service: 09/11/2020  Chief Complaint  Patient presents with  . Medicare Wellness  . Depression  . Gastroesophageal Reflux  . Hypertension  . COPD  . Anxiety     HPI Pt is here for routine health maintenance examination -Still having difficulty with epigastric pain. This has been going on for years. She had an upper Gi in Nov that was normal and she had a gastric emptying study that was normal in 2018. A small hiatal hernia was seen on endoscopy in 2017. Patient has been taking pepcid daily and will use tums when really bad. She is interested in a GI referral. -Does not eat big meals due to the abdominal pain and bloating, will eat small  -Cymbalta seems to be helping, no longer using buspar -smoking up to 1ppd, has cut back some -Works out in the yard going to try to add in daily walks. -BP elevated initially but improved to 132/74 on recheck. BP at home 130-140/80s. -patient is also asking about tetanus booster and thinks she is well overdue for it -colonoscopy done in 2017, due for mammogram  Current Medication: Outpatient Encounter Medications as of 09/09/2020  Medication Sig Note  . amLODipine (NORVASC) 2.5 MG tablet Take one tablet by mouth daily.   . bisoprolol-hydrochlorothiazide (ZIAC) 2.5-6.25 MG tablet Take 1 tablet po qd for blood pressure   . DULoxetine (CYMBALTA) 30 MG capsule TAKE 1 CAPSULE BY MOUTH EVERY DAY   . pantoprazole (PROTONIX) 40 MG tablet Take 1 tablet two times daily   . [DISCONTINUED] Tdap (ADACEL) 08-19-13.5 LF-MCG/0.5 injection Inject 0.5 mLs into the muscle once.   . [EXPIRED] Tdap (ADACEL) 08-19-13.5 LF-MCG/0.5 injection Inject 0.5 mLs into the muscle once for 1 dose.   . [DISCONTINUED] busPIRone (BUSPAR) 5 MG tablet Take 1 tablet po two to three times per day prn anxiety (Patient not  taking: Reported on 09/09/2020) 09/21/2019: PRN, hasnt used in 1+ month    No facility-administered encounter medications on file as of 09/09/2020.    Surgical History: Past Surgical History:  Procedure Laterality Date  . ABSCESS DRAINAGE  11/17/13   buttock right  . BREAST BIOPSY Right    neg  . CATARACT EXTRACTION W/PHACO Right 08/17/2019   Procedure: CATARACT EXTRACTION PHACO AND INTRAOCULAR LENS PLACEMENT ([place]) RIGHT VISION BLUE 5.32  00:42.7;  Surgeon: Marchia Meiers, MD;  Location: [facility];  Service: Ophthalmology;  Laterality: Right;  . CATARACT EXTRACTION W/PHACO Left 09/21/2019   Procedure: CATARACT EXTRACTION PHACO AND INTRAOCULAR LENS PLACEMENT ([place]) LEFT VISION BLUE;  Surgeon: Marchia Meiers, MD;  Location: [place] [place];  Service: Ophthalmology;  Laterality: Left;  6.24 0:40.6  . CHOLECYSTECTOMY  2008  . COLONOSCOPY  2014   [facility] [place]  . COLONOSCOPY WITH PROPOFOL N/A 08/26/2015   Procedure: COLONOSCOPY WITH PROPOFOL;  Surgeon: Hulen Luster, MD;  Location: [facility] ENDOSCOPY;  Service: Gastroenterology;  Laterality: N/A;  . ESOPHAGOGASTRODUODENOSCOPY N/A 11/15/2014   Procedure: ESOPHAGOGASTRODUODENOSCOPY (EGD);  Surgeon: Hulen Luster, MD;  Location: [facility] END[facility]COPY;  Service: Gastroenterology;  Laterality: N/A;  . UPPER GI ENDOSCOPY  2014   [facility] [place] History: Past Medical History:  Diagnosis Date  . Anxiety   . Arthritis   . Atrophic kidney   . Chronic constipation   . Colon polyp 2013  .  COPD (chronic obstructive pulmonary disease) ([place])   . Depression   . Diverticulosis   . Esophageal stricture   . Esophagitis   . Fibrocystic breast disease   . GERD (gastroesophageal reflux disease)   . History of hiatal hernia   . Hypertension   . IBS (irritable bowel syndrome)   . Menopause   . Ovarian cyst   . Plantar fasciitis   . Postmenopausal   . Renal artery occlusion ([place])   . Renal insufficiency    Right kidney is  small and not functioning.    Family History: Family History  Problem Relation Age of Onset  . Heart disease Mother   . Heart attack Mother   . Diabetes Mother   . Alzheimer's disease Father   . Breast cancer Sister 52  . Lung cancer Brother   . Brain cancer Brother   . Lung cancer Sister   . Bladder Cancer Sister   . Lung cancer Brother   . Leukemia Brother   . Diabetes Brother       Review of Systems  Constitutional: Negative for chills, fatigue and unexpected weight change.  HENT: Negative for congestion, postnasal drip, rhinorrhea, sneezing and sore throat.   Eyes: Negative for redness.  Respiratory: Negative for cough, chest tightness and shortness of breath.   Cardiovascular: Negative for chest pain and palpitations.  Gastrointestinal: Positive for abdominal pain. Negative for constipation, diarrhea, nausea and vomiting.       Reflux  Genitourinary: Negative for dysuria and frequency.  Musculoskeletal: Negative for arthralgias, back pain, joint swelling and neck pain.  Skin: Negative for rash.  Neurological: Negative.  Negative for tremors and numbness.  Hematological: Negative for adenopathy. Does not bruise/bleed easily.  Psychiatric/Behavioral: Negative for behavioral problems (Depression), sleep disturbance and suicidal ideas. The patient is nervous/anxious.      Vital Signs: BP (!) 158/100   Pulse 90   Temp (!) 97.4 F (36.3 C)   Resp 16   Ht 5\' 8"  (1.727 m)   Wt 186 lb 3.2 oz (84.5 kg)   SpO2 97%   BMI 28.31 kg/m    Physical Exam Vitals and nursing note reviewed.  Constitutional:      General: She is not in acute distress.    Appearance: She is well-developed. She is not diaphoretic.  HENT:     Head: Normocephalic and atraumatic.     Mouth/Throat:     Pharynx: No oropharyngeal exudate.  Eyes:     Pupils: Pupils are equal, round, and reactive to light.  Neck:     Thyroid: No thyromegaly.     Vascular: No JVD.     Trachea: No tracheal  deviation.  Cardiovascular:     Rate and Rhythm: Normal rate and regular rhythm.     Heart sounds: Normal heart sounds. No murmur heard. No friction rub. No gallop.   Pulmonary:     Effort: Pulmonary effort is normal. No respiratory distress.     Breath sounds: No wheezing or rales.  Chest:     Chest wall: No tenderness.  Abdominal:     General: Bowel sounds are normal.     Palpations: Abdomen is soft.     Tenderness: There is abdominal tenderness.  Musculoskeletal:        General: Normal range of motion.     Cervical back: Normal range of motion and neck supple.  Lymphadenopathy:     Cervical: No cervical adenopathy.  Skin:    General: Skin is warm  and dry.  Neurological:     Mental Status: She is alert and oriented to person, place, and time.     Cranial Nerves: No cranial nerve deficit.  Psychiatric:        Behavior: Behavior normal.        Thought Content: Thought content normal.        Judgment: Judgment normal.      LABS: Recent Results (from the past 2160 hour(s))  UA/M w/rflx Culture, Routine     Status: None   Collection Time: 09/09/20 10:17 AM   Specimen: Urine   Urine  Result Value Ref Range   Specific Gravity, UA CANCELED     Comment: Test not performed. Insufficient specimen to perform or complete analysis.  Result canceled by the ancillary.    pH, UA CANCELED     Comment: Test not performed  Result canceled by the ancillary.    Protein,UA CANCELED     Comment: Test not performed  Result canceled by the ancillary.    Glucose, UA CANCELED     Comment: Test not performed  Result canceled by the ancillary.    Ketones, UA CANCELED     Comment: Test not performed  Result canceled by the ancillary.         Assessment/Plan: 1. Encounter for general adult medical examination with abnormal findings Colonoscopy UTD, mammogram ordered. Routine labs also ordered  2. Gastroesophageal reflux disease without esophagitis Normal upper GI.  Chronic GERD and epigastric pain despite pepcid. Will refer to GI to further evaluation and management - Ambulatory referral to Gastroenterology  3. Epigastric pain Chronic GERD and epigastric pain despite pepcid. Will refer to GI to further evaluation and management - Ambulatory referral to Gastroenterology  4. Cigarette nicotine dependence with nicotine-induced disorder Smoking cessation counseling: 1. Pt acknowledges the risks of long term smoking, she will try to quite smoking. 2. Options for different medications including nicotine products, chewing gum, patch etc, Wellbutrin and Chantix is discussed 3. Goal and date of compete cessation is discussed 4. Total time spent in smoking cessation is 10 min. Will order lung cancer screening CT - CT CHEST LUNG CA SCREEN LOW DOSE W/O CM  5. Screening for lung cancer Will order lung cancer screening CT due to long time smoker - CT CHEST LUNG CA SCREEN LOW DOSE W/O CM  6. Vitamin D deficiency - VITAMIN D 25 Hydroxy (Vit-D Deficiency, Fractures)  7. Other fatigue - CBC w/Diff/Platelet - Comprehensive metabolic panel - Lipid Panel With LDL/HDL Ratio - TSH + free T4 - VITAMIN D 25 Hydroxy (Vit-D Deficiency, Fractures)  8. Encounter for screening mammogram for breast cancer - MM 3D SCREEN BREAST BILATERAL  9. Need for Tdap vaccination - Tdap (ADACEL) 08-19-13.5 LF-MCG/0.5 injection; Inject 0.5 mLs into the muscle once for 1 dose.  Dispense: 0.5 mL; Refill: 0  10. Dysuria - UA/M w/rflx Culture, Routine   General Counseling: sun kihn understanding of the findings of todays visit and agrees with plan of treatment. I have discussed any further diagnostic evaluation that may be needed or ordered today. We also reviewed her medications today. she has been encouraged to call the office with any questions or concerns that should arise related to todays visit.    Counseling: Cardiac risk factor modification:  1. Control blood  pressure. 2. Exercise as prescribed. 3. Follow low sodium, low fat diet. and low fat and low cholestrol diet. 4. Take ASA 81mg  once a day. 5. Restricted calories diet to lose  weight.   Orders Placed This Encounter  Procedures  . CT CHEST LUNG CA SCREEN LOW DOSE W/O CM  . MM 3D SCREEN BREAST BILATERAL  . UA/M w/rflx Culture, Routine  . CBC w/Diff/Platelet  . Comprehensive metabolic panel  . Lipid Panel With LDL/HDL Ratio  . TSH + free T4  . VITAMIN D 25 Hydroxy (Vit-D Deficiency, Fractures)  . Ambulatory referral to Gastroenterology    Meds ordered this encounter  Medications  . Tdap (ADACEL) 08-19-13.5 LF-MCG/0.5 injection    Sig: Inject 0.5 mLs into the muscle once for 1 dose.    Dispense:  0.5 mL    Refill:  0    This patient was seen by Drema Dallas, PA-C in collaboration with Dr. Clayborn Bigness as a part of collaborative care agreement.  Total time spent:40 Minutes  Time spent includes review of chart, medications, test results, and follow up plan with the patient.     Lavera Guise, MD  Internal Medicine

## 2020-09-10 LAB — UA/M W/RFLX CULTURE, ROUTINE

## 2020-09-10 MED ORDER — TETANUS-DIPHTH-ACELL PERTUSSIS 5-2-15.5 LF-MCG/0.5 IM SUSP: 0.5000 mL | Freq: Once | INTRAMUSCULAR | 0 refills | Status: AC

## 2020-09-11 ENCOUNTER — Other Ambulatory Visit: Payer: Self-pay | Admitting: Internal Medicine

## 2020-09-11 ENCOUNTER — Other Ambulatory Visit: Payer: Self-pay | Admitting: Nurse Practitioner

## 2020-09-11 ENCOUNTER — Other Ambulatory Visit: Payer: Self-pay

## 2020-09-11 DIAGNOSIS — K219 Gastro-esophageal reflux disease without esophagitis: Secondary | ICD-10-CM

## 2020-09-11 DIAGNOSIS — E756 Lipid storage disorder, unspecified: Secondary | ICD-10-CM | POA: Diagnosis not present

## 2020-09-11 DIAGNOSIS — F411 Generalized anxiety disorder: Secondary | ICD-10-CM

## 2020-09-11 DIAGNOSIS — E038 Other specified hypothyroidism: Secondary | ICD-10-CM | POA: Diagnosis not present

## 2020-09-11 DIAGNOSIS — R300 Dysuria: Secondary | ICD-10-CM

## 2020-09-11 DIAGNOSIS — I10 Essential (primary) hypertension: Secondary | ICD-10-CM

## 2020-09-11 DIAGNOSIS — R5383 Other fatigue: Secondary | ICD-10-CM | POA: Diagnosis not present

## 2020-09-11 DIAGNOSIS — E079 Disorder of thyroid, unspecified: Secondary | ICD-10-CM | POA: Diagnosis not present

## 2020-09-11 DIAGNOSIS — E559 Vitamin D deficiency, unspecified: Secondary | ICD-10-CM | POA: Diagnosis not present

## 2020-09-11 MED ORDER — PANTOPRAZOLE SODIUM 40 MG PO TBEC: DELAYED_RELEASE_TABLET | ORAL | 1 refills | Status: DC

## 2020-09-11 MED ORDER — DULOXETINE HCL 30 MG PO CPEP: ORAL_CAPSULE | ORAL | 3 refills | Status: DC

## 2020-09-11 MED ORDER — BISOPROLOL-HYDROCHLOROTHIAZIDE 2.5-6.25 MG PO TABS: ORAL_TABLET | ORAL | 1 refills | Status: DC

## 2020-09-11 MED ORDER — AMLODIPINE BESYLATE 2.5 MG PO TABS: ORAL_TABLET | ORAL | 1 refills | Status: DC

## 2020-09-12 LAB — LIPID PANEL WITH LDL/HDL RATIO
Cholesterol, Total: 238 mg/dL — ABNORMAL HIGH (ref 100–199)
HDL: 37 mg/dL — ABNORMAL LOW
LDL Chol Calc (NIH): 159 mg/dL — ABNORMAL HIGH (ref 0–99)
LDL/HDL Ratio: 4.3 ratio — ABNORMAL HIGH (ref 0.0–3.2)
Triglycerides: 229 mg/dL — ABNORMAL HIGH (ref 0–149)
VLDL Cholesterol Cal: 42 mg/dL — ABNORMAL HIGH (ref 5–40)

## 2020-09-12 LAB — CBC WITH DIFFERENTIAL/PLATELET
Basophils Absolute: 0.1 10*3/uL (ref 0.0–0.2)
Basos: 1 %
EOS (ABSOLUTE): 0.2 10*3/uL (ref 0.0–0.4)
Eos: 3 %
Hematocrit: 46.3 % (ref 34.0–46.6)
Hemoglobin: 15.2 g/dL (ref 11.1–15.9)
Immature Grans (Abs): 0 10*3/uL (ref 0.0–0.1)
Immature Granulocytes: 0 %
Lymphocytes Absolute: 1.9 10*3/uL (ref 0.7–3.1)
Lymphs: 28 %
MCH: 28.9 pg (ref 26.6–33.0)
MCHC: 32.8 g/dL (ref 31.5–35.7)
MCV: 88 fL (ref 79–97)
Monocytes Absolute: 0.4 10*3/uL (ref 0.1–0.9)
Monocytes: 6 %
Neutrophils Absolute: 4.1 10*3/uL (ref 1.4–7.0)
Neutrophils: 62 %
Platelets: 373 10*3/uL (ref 150–450)
RBC: 5.26 x10E6/uL (ref 3.77–5.28)
RDW: 13.2 % (ref 11.7–15.4)
WBC: 6.7 10*3/uL (ref 3.4–10.8)

## 2020-09-12 LAB — COMPREHENSIVE METABOLIC PANEL WITH GFR
ALT: 11 [IU]/L (ref 0–32)
AST: 18 [IU]/L (ref 0–40)
Albumin/Globulin Ratio: 1.9 (ref 1.2–2.2)
Albumin: 4.5 g/dL (ref 3.7–4.7)
Alkaline Phosphatase: 90 [IU]/L (ref 44–121)
BUN/Creatinine Ratio: 13 (ref 12–28)
BUN: 20 mg/dL (ref 8–27)
Bilirubin Total: 0.3 mg/dL (ref 0.0–1.2)
CO2: 26 mmol/L (ref 20–29)
Calcium: 9.7 mg/dL (ref 8.7–10.3)
Chloride: 99 mmol/L (ref 96–106)
Creatinine, Ser: 1.52 mg/dL — ABNORMAL HIGH (ref 0.57–1.00)
Globulin, Total: 2.4 g/dL (ref 1.5–4.5)
Glucose: 103 mg/dL — ABNORMAL HIGH (ref 65–99)
Potassium: 4.8 mmol/L (ref 3.5–5.2)
Sodium: 140 mmol/L (ref 134–144)
Total Protein: 6.9 g/dL (ref 6.0–8.5)
eGFR: 36 mL/min/{1.73_m2} — ABNORMAL LOW

## 2020-09-12 LAB — TSH+FREE T4
Free T4: 1.34 ng/dL (ref 0.82–1.77)
TSH: 1.84 u[IU]/mL (ref 0.450–4.500)

## 2020-09-12 LAB — VITAMIN D 25 HYDROXY (VIT D DEFICIENCY, FRACTURES): Vit D, 25-Hydroxy: 29.4 ng/mL — ABNORMAL LOW (ref 30.0–100.0)

## 2020-09-14 LAB — CULTURE, URINE COMPREHENSIVE

## 2020-09-17 NOTE — Progress Notes (Signed)
Urine cx are insignificant, will discuss on next visit

## 2020-09-23 ENCOUNTER — Other Ambulatory Visit: Payer: Self-pay

## 2020-09-23 ENCOUNTER — Telehealth: Payer: Self-pay | Admitting: *Deleted

## 2020-09-23 MED ORDER — NITROFURANTOIN MONOHYD MACRO 100 MG PO CAPS: 100.0000 mg | ORAL_CAPSULE | Freq: Two times a day (BID) | ORAL | 0 refills | Status: DC

## 2020-09-23 NOTE — Telephone Encounter (Signed)
Pt called that she having back pain and difficult urinated as per lauren send macrobid and advised if not feeling better need to seen and we can repeat urine

## 2020-09-23 NOTE — Telephone Encounter (Signed)
Received referral for low dose lung cancer screening CT scan. Message left at phone number listed in EMR for patient to call me back to facilitate scheduling scan.  

## 2020-09-24 ENCOUNTER — Telehealth: Payer: Self-pay | Admitting: *Deleted

## 2020-09-24 DIAGNOSIS — F17200 Nicotine dependence, unspecified, uncomplicated: Secondary | ICD-10-CM

## 2020-09-24 DIAGNOSIS — Z122 Encounter for screening for malignant neoplasm of respiratory organs: Secondary | ICD-10-CM

## 2020-09-24 DIAGNOSIS — Z87891 Personal history of nicotine dependence: Secondary | ICD-10-CM

## 2020-09-24 NOTE — Telephone Encounter (Signed)
Received referral for initial lung cancer screening scan. Contacted patient and obtained smoking history,(current smoker, 1 ppd x 30 yrs) as well as answering questions related to screening process. Patient denies signs of lung cancer such as weight loss or hemoptysis. Patient denies comorbidity that would prevent curative treatment if lung cancer were found. Patient is scheduled for shared decision making visit and CT scan on 10/02/20 @ 10:30 am.

## 2020-10-02 ENCOUNTER — Ambulatory Visit
Admission: RE | Admit: 2020-10-02 | Discharge: 2020-10-02 | Disposition: A | Discharge status: Home / Self Care | Payer: Medicare Other | Source: Ambulatory Visit | Attending: Oncology | Admitting: Oncology

## 2020-10-02 ENCOUNTER — Other Ambulatory Visit: Payer: Self-pay

## 2020-10-02 ENCOUNTER — Inpatient Hospital Stay: Payer: Medicare Other | Attending: Oncology | Admitting: Hospice and Palliative Medicine

## 2020-10-02 DIAGNOSIS — Z87891 Personal history of nicotine dependence: Secondary | ICD-10-CM | POA: Insufficient documentation

## 2020-10-02 DIAGNOSIS — F17200 Nicotine dependence, unspecified, uncomplicated: Secondary | ICD-10-CM

## 2020-10-02 DIAGNOSIS — Z122 Encounter for screening for malignant neoplasm of respiratory organs: Secondary | ICD-10-CM

## 2020-10-02 NOTE — Progress Notes (Signed)
Virtual Visit via Telephone Note  I connected withNAME@ on 10/02/20 atCHLAPPTTIME@ by telephone and verified that I am speaking with the correct person using two identifiers.   I discussed the limitations of evaluation and management by telemedicine and the availability of in person appointments. The patient expressed understanding and agreed to proceed.  Location: Patient: [facility] Provider: Clinic   In accordance with CMS guidelines, patient has met eligibility criteria including age, absence of signs or symptoms of lung cancer.  Social History   Tobacco Use   Smoking status: Every Day    Packs/day: 1.00    Years: 30.00    Pack years: 30.00    Types: Cigarettes   Smokeless tobacco: Never   Tobacco comments:    since age 70  Vaping Use   Vaping Use: Never used  Substance Use Topics   Alcohol use: No   Drug use: No      A shared decision-making session was conducted prior to the performance of CT scan. This includes one or more decision aids, includes benefits and harms of screening, follow-up diagnostic testing, over-diagnosis, false positive rate, and total radiation exposure.   Counseling on the importance of adherence to annual lung cancer LDCT screening, impact of co-morbidities, and ability or willingness to undergo diagnosis and treatment is imperative for compliance of the program.   Counseling on the importance of continued smoking cessation for former smokers; the importance of smoking cessation for current smokers, and information about tobacco cessation interventions have been given to patient including [place] and 1800 quit [place] programs.   Written order for lung cancer screening with LDCT has been given to the patient and any and all questions have been answered to the best of my abilities.    Yearly follow up will be coordinated by Burgess Estelle, Thoracic Navigator.  Time Total: 5 minutes  Visit consisted of counseling and education dealing with  complex health screening. Greater than 50%  of this time was spent counseling and coordinating care related to the above assessment and plan.  Signed by: Altha Harm, PhD, NP-C

## 2020-10-08 ENCOUNTER — Ambulatory Visit: Payer: Medicare Other | Admitting: Gastroenterology

## 2020-10-09 ENCOUNTER — Telehealth: Payer: Self-pay

## 2020-10-09 NOTE — Progress Notes (Signed)
Spoke to pt and informed her to start OTC vit D supplement, and also informed pt that provider would like to start her on a statin medication due to cholesterol being high.  Pt stated that she didn't want to take that medication and would work on her diet to help get it under control.  I am also going to mail pt a prudent diet pamphlet to go by.

## 2020-10-09 NOTE — Telephone Encounter (Signed)
Spoke to pt and informed her of the labs and advised that she should take OTC vit D supplement.  Informed pt that Lauren would like to start her on a statin therapy but pt would like to try to get under control by her diet.  I advised pt that I am mailing her a prudent diet to go by also.

## 2020-10-11 ENCOUNTER — Telehealth: Payer: Self-pay | Admitting: Pharmacist

## 2020-10-11 ENCOUNTER — Other Ambulatory Visit: Payer: Self-pay

## 2020-10-11 MED ORDER — ROSUVASTATIN CALCIUM 5 MG PO TABS: ORAL_TABLET | ORAL | 1 refills | Status: DC

## 2020-10-11 NOTE — Progress Notes (Addendum)
    Chronic Care Management Pharmacy Assistant   Name: Shannon Li  MRN: [id number] DOB: 08-15-1950  Shannon Li is an 70 y.o. year old female who presents for his initial CCM visit with the clinical pharmacist.  Reason for Encounter: Chart Prep   Conditions to be addressed/monitored: HTN, COPD, GERD.  Primary concerns for visit include: HTN.  Recent office visits:  09/09/20 Mylinda Latina, PA-C. For general adult medical examination. No medication changes.   Recent consult visits:  10/02/20 Oncology (Video Visit) Borders, Kirt Boys, NP. For follow-up. No medication changes. 05/08/20 Dermatology Dasher, [place] No information given.   Hospital visits:  None in previous 6 months  Medication History:  N/A  Medications: Outpatient Encounter Medications as of 10/11/2020  Medication Sig   amLODipine (NORVASC) 2.5 MG tablet Take one tablet by mouth daily.   bisoprolol-hydrochlorothiazide (ZIAC) 2.5-6.25 MG tablet Take 1 tablet po qd for blood pressure   DULoxetine (CYMBALTA) 30 MG capsule TAKE 1 CAPSULE BY MOUTH EVERY DAY   nitrofurantoin, macrocrystal-monohydrate, (MACROBID) 100 MG capsule Take 1 capsule (100 mg total) by mouth 2 (two) times daily.   pantoprazole (PROTONIX) 40 MG tablet Take 1 tablet two times daily   No facility-administered encounter medications on file as of 10/11/2020.   Have you seen any other providers since your last visit? Patient stated no.  Any changes in your medications or health? Patient stated no.  Any side effects from any medications? Patient stated no.  Do you have an symptoms or problems not managed by your medications? Patient stated no.  Any concerns about your health right now? Patient stated no.  Has your provider asked that you check blood pressure, blood sugar, or follow special diet at home? Patient stated she monitors her blood pressure.  Do you get any type of exercise on a regular basis? Patient stated no, but she  works out in the yard.   Can you think of a goal you would like to reach for your health? Patient stated to loose weight and get her cholesterol levels down.   Do you have any problems getting your medications? Patient stated no.  Is there anything that you would like to discuss during the appointment? Patient stated no.  Please bring medications and supplements to appointment, patient reminded of her OTP appointment on 10/15/20 at 1:30 pm.   Follow-Up:Pharmacist Review   [name], [place] Pharmacist Assistant [phone]

## 2020-10-11 NOTE — Telephone Encounter (Signed)
Spoke to pt and informed her of the CT results.  Also spoke to pt about starting a statin therapy, pt states that she didn't like taking one of the medication due to making your joints hurt but she would try a different stating.  Advised that Ander Purpura will order a carotid US and they will contact her with the appt time and day.  Pt informed that they will schedule her for repeat CT in 1 year for monitoring.  Per Lauren I sent in Crestor 5 mg 1 tablet daily at night to pt's pharmacy and pt was informed.

## 2020-10-14 ENCOUNTER — Encounter: Payer: Self-pay | Admitting: *Deleted

## 2020-10-14 NOTE — Progress Notes (Signed)
Chronic Care Management Pharmacy Note  10/15/2020 Name:  Shannon Li MRN:  [id number] DOB:  Jul 31, 1950  Summary: Initial visit with PharmD.  Med review, discussed elevated LDL.  She had not started her Crestor yet due to cost burden.  Discussed using GoodRx coupon at [employer] before we tried to call in alternatives.  All other medications accurate and up to date.  Recommendations/Changes made from today's visit: Good Rx for Crestor - if price still not good for patient we can look at pravastatin or simvastatin  Plan: Patient to call back to let us know what she decides FU on phone in 4 months   Subjective: Shannon Li is an 70 y.o. year old female who is a primary patient of Humphrey Rolls, Timoteo Gaul, MD.  The CCM team was consulted for assistance with disease management and care coordination needs.    Engaged with patient by telephone for initial visit in response to provider referral for pharmacy case management and/or care coordination services.   Consent to Services:  The patient was given the following information about Chronic Care Management services today, agreed to services, and gave verbal consent: 1. CCM service includes personalized support from designated clinical staff supervised by the primary care provider, including individualized plan of care and coordination with other care providers 2. 24/7 contact phone numbers for assistance for urgent and routine care needs. 3. Service will only be billed when office clinical staff spend 20 minutes or more in a month to coordinate care. 4. Only one practitioner may furnish and bill the service in a calendar month. 5.The patient may stop CCM services at any time (effective at the end of the month) by phone call to the office staff. 6. The patient will be responsible for cost sharing (co-pay) of up to 20% of the service fee (after annual deductible is met). Patient agreed to services and consent obtained.  Patient Care Team: Lavera Guise, MD as PCP - General (Internal Medicine) Lavera Guise, MD (Internal Medicine) Bary Castilla, Forest Gleason, MD (General Surgery) Edythe Clarity, [facility] (Pharmacist) Edythe Clarity, [facility] as Pharmacist (Pharmacist)  Recent office visits:  09/09/20 Mylinda Latina, PA-C. For general adult medical examination. No medication changes.    Recent consult visits:  10/02/20 Oncology (Video Visit) Borders, Kirt Boys, NP. For follow-up. No medication changes. 05/08/20 Dermatology Dasher, [place] No information given.    Hospital visits:  None in previous 6 months   Medication History: N/A   Objective:  Lab Results  Component Value Date   CREATININE 1.52 (H) 09/11/2020   BUN 20 09/11/2020   GFRNONAA 38 (L) 09/07/2019   GFRAA 44 (L) 09/07/2019   NA 140 09/11/2020   K 4.8 09/11/2020   CALCIUM 9.7 09/11/2020   CO2 26 09/11/2020   GLUCOSE 103 (H) 09/11/2020    Lab Results  Component Value Date/Time   HGBA1C 5.8 (H) 09/07/2019 10:40 AM    Last diabetic Eye exam: No results found for: HMDIABEYEEXA  Last diabetic Foot exam: No results found for: HMDIABFOOTEX   Lab Results  Component Value Date   CHOL 238 (H) 09/11/2020   HDL 37 (L) 09/11/2020   LDLCALC 159 (H) 09/11/2020   TRIG 229 (H) 09/11/2020    Hepatic Function Latest Ref Rng & Units 09/11/2020 09/07/2019 08/22/2018  Total Protein 6.0 - 8.5 g/dL 6.9 6.6 6.5  Albumin 3.7 - 4.7 g/dL 4.5 4.1 4.3  AST 0 - 40 IU/L 18 18 18  ALT 0 - 32 IU/L _0 Alk Phosphatase 44 - 121 IU/L 90 78 82  Total Bilirubin 0.0 - 1.2 mg/dL 0.3 0.4 0.3    Lab Results  Component Value Date/Time   TSH 1.840 09/11/2020 09:24 AM   TSH 2.110 09/07/2019 10:40 AM   FREET4 1.34 09/11/2020 09:24 AM   FREET4 1.32 09/07/2019 10:40 AM    CBC Latest Ref Rng & Units 09/11/2020 09/07/2019 08/22/2018  WBC 3.4 - 10.8 x10E3/uL 6.7 6.7 6.5  Hemoglobin 11.1 - 15.9 g/dL 15.2 15.4 14.9  Hematocrit 34.0 - 46.6 % 46.3 45.9 45.4  Platelets 150 - 450 x10E3/uL 373  292 297    Lab Results  Component Value Date/Time   VD25OH 29.4 (L) 09/11/2020 09:24 AM   VD25OH 32.9 09/07/2019 10:40 AM    Clinical ASCVD: No  The 10-year ASCVD risk score Mikey Bussing DC Jr., et al., 2013) is: 33.8%   Values used to calculate the score:     Age: 36 years     Sex: Female     Is Non-Hispanic African American: No     Diabetic: No     Tobacco smoker: Yes     Systolic Blood Pressure: 258 mmHg     Is BP treated: Yes     HDL Cholesterol: 37 mg/dL     Total Cholesterol: 238 mg/dL    Depression screen [facility] 2/9 09/09/2020 03/04/2020 12/01/2019  Decreased Interest 0 0 0  Down, Depressed, Hopeless 0 0 0  PHQ - 2 Score 0 0 0     Social History   Tobacco Use  Smoking Status Every Day   Packs/day: 1.00   Years: 30.00   Pack years: 30.00   Types: Cigarettes  Smokeless Tobacco Never  Tobacco Comments   since age 77   BP Readings from Last 3 Encounters:  09/09/20 (!) 158/100  03/04/20 (!) 142/78  12/01/19 (!) 157/74   Pulse Readings from Last 3 Encounters:  09/09/20 90  03/04/20 84  12/01/19 62   Wt Readings from Last 3 Encounters:  10/02/20 186 lb (84.4 kg)  09/09/20 186 lb 3.2 oz (84.5 kg)  03/04/20 190 lb 6.4 oz (86.4 kg)   BMI Readings from Last 3 Encounters:  10/02/20 28.28 kg/m  09/09/20 28.31 kg/m  03/04/20 28.95 kg/m    Assessment/Interventions: Review of patient past medical history, allergies, medications, health status, including review of consultants reports, laboratory and other test data, was performed as part of comprehensive evaluation and provision of chronic care management services.   SDOH:  (Social Determinants of Health) assessments and interventions performed: Yes  Financial Resource Strain: Low Risk    Difficulty of Paying Living Expenses: Not very hard    SDOH Screenings   Alcohol Screen: Not on file  Depression (PHQ2-9): Low Risk    PHQ-2 Score: 0  Financial Resource Strain: Low Risk    Difficulty of Paying Living  Expenses: Not very hard  Food Insecurity: Not on file  Housing: Not on file  Physical Activity: Not on file  Social Connections: Not on file  Stress: Not on file  Tobacco Use: High Risk   Smoking Tobacco Use: Every Day   Smokeless Tobacco Use: Never  Transportation Needs: Not on file    [place]  Allergies  Allergen Reactions   Bupropion Nausea And Vomiting   Penicillin V Potassium Other (See Comments)   Penicillins Hives    Medications Reviewed Today     Reviewed by Beverly Milch  L, RPH (Pharmacist) on 10/15/20 at 1423  Med List Status: <None>   Medication Order Taking? Sig Documenting Provider Last Dose Status Informant  amLODipine (NORVASC) 2.5 MG tablet [id number] Yes Take one tablet by mouth daily. Lavera Guise, MD Taking Active   bisoprolol-hydrochlorothiazide [facility]) 2.5-6.25 MG tablet [id number] Yes Take 1 tablet po qd for blood pressure Lavera Guise, MD Taking Active   DULoxetine (CYMBALTA) 30 MG capsule [id number] Yes TAKE 1 CAPSULE BY MOUTH EVERY DAY Lavera Guise, MD Taking Active   pantoprazole (PROTONIX) 40 MG tablet [id number] Yes Take 1 tablet two times daily Lavera Guise, MD Taking Active   rosuvastatin (CRESTOR) 5 MG tablet [id number] Yes Take 1 tablet by mouth at night Lavera Guise, MD Taking Active             Patient Active Problem List   Diagnosis Date Noted   Needs flu shot 03/04/2020   Cervical disc disease with myelopathy 10/28/2019   Degenerative disc disease, thoracic 10/28/2019   Gastroesophageal reflux disease without esophagitis 10/03/2018   Generalized anxiety disorder 10/03/2018   Chronic obstructive pulmonary disease ([place]) 09/18/2018   Acute upper respiratory infection 03/03/2018   Cough 03/03/2018   Bereavement due to life event 03/03/2018   Encounter for general adult medical examination with abnormal findings 08/24/2017   Dyspnea on exertion 08/24/2017   Dysuria 08/24/2017   Need for vaccination against Streptococcus  pneumoniae using pneumococcal conjugate vaccine 13 08/24/2017   Essential hypertension 06/02/2017   Inguinal abscess 05/23/2014   Cutaneous abscess of groin 05/23/2014   Cellulitis and abscess of trunk 05/23/2014   Ovarian cyst, complex 04/06/2012   Other ovarian cyst, unspecified side 04/06/2012   Ovarian retention cyst 04/06/2012    Immunization History  Administered Date(s) Administered   Influenza Inj Mdck Quad Pf 04/09/2017, 03/04/2020   Influenza,inj,Quad PF,6+ Mos 04/06/2018   Influenza-Unspecified 02/17/2019   Pneumococcal Conjugate-13 08/24/2017   Pneumococcal Polysaccharide-23 09/16/2018    Conditions to be addressed/monitored:  HTN, COPD, GERD, GAD  Care Plan : General Pharmacy (Adult)  Updates made by Edythe Clarity, RPH since 10/15/2020 12:00 AM     Problem: HTN, COPD, GERD, GAD, HLD   Priority: High  Onset Date: 10/15/2020     Long-Range Goal: Patient-Specific Goal   Start Date: 10/15/2020  Expected End Date: 04/16/2021  This Visit's Progress: On track  Priority: High  Note:   Current Barriers:  Unable to achieve control of cholesterol.   Pharmacist Clinical Goal(s):  Patient will achieve control of cholesterol as evidenced by lipid panel adhere to plan to optimize therapeutic regimen for lipids as evidenced by report of adherence to recommended medication management changes contact provider office for questions/concerns as evidenced notation of same in electronic health record through collaboration with PharmD and provider.   Interventions: 1:1 collaboration with Lavera Guise, MD regarding development and update of comprehensive plan of care as evidenced by provider attestation and co-signature Inter-disciplinary care team collaboration (see longitudinal plan of care) Comprehensive medication review performed; medication list updated in electronic medical record  Hypertension (BP goal <140/90) -Controlled -Current treatment: Amlodipine 2.5m  daily Bisoprolol/HCTZ 2.5-6.25mg daily -Medications previously tried: none noted  -Current home readings: 130s/80s -Current dietary habits: baked foods, water, lemonade with artificial sweetener, cereal -Current exercise habits: minimal besides yard work -Denies hypotensive/hypertensive symptoms -Educated on BP goals and benefits of medications for prevention of heart attack, stroke and kidney damage; Importance of home blood pressure monitoring; Symptoms of hypotension and  importance of maintaining adequate hydration; -Counseled to monitor BP at home a few times per week, document, and provide log at future appointments -Recommended to continue current medication  Hyperlipidemia: (LDL goal < 100) -Uncontrolled -Current treatment: Rosuvastatin 9m daily -Medications previously tried: Lipitor (cramps)  -Current dietary patterns: see above -Current exercise habits: see above -Educated on Cholesterol goals;  Benefits of statin for ASCVD risk reduction; Importance of limiting foods high in cholesterol; -She still has not started her Crestor because she went to pick it up at the pharmacy and it was > $100 for a 90 days supply.   -Discussed alternative statins she could use, she is going to check on the price using [facility]Rx at [employer] [employer] since she already gets another medication there using a coupon.   -Discussed the importance of taking something based on most recent LDL. -Counseled on diet and exercise extensively Recommended coupon, if that does not work will look to call in alternative statin.  Anxiety (Goal: Minimize symptoms) -Controlled -Current treatment: Duloxetine 354mdaily -Medications previously tried/failed: Buspirone, escitalopram -PHQ9:  [facility] 09/09/2020 03/04/2020 12/01/2019 10/20/2019 09/07/2019  Decreased Interest 0 0 0 1 0  Down, Depressed, Hopeless 0 0 0 0 0  PHQ - 2 Score 0 0 0 1 0  -GAD7: No flowsheet data found.  -Patient reports meed and  sleep have been good lately -Duloxetine has really made a difference once she switched to this. -Educated on Benefits of medication for symptom control -Recommended to continue current medication  GERD (Goal: Minimize symptoms) -Controlled -Current treatment  Pantoprazole 4038mwice daily -Medications previously tried: none noted -Still having sporadic episodes of stomach pain/fullness.  She is scheduled to see a GI specialist about this ongoing issue. -Takes medication appropriately  -Recommended to continue current medication  Patient Goals/Self-Care Activities Patient will:  - take medications as prescribed focus on medication adherence by pill counts check blood pressure a few times weekly, document, and provide at future appointments target a minimum of 150 minutes of moderate intensity exercise weekly  Follow Up Plan: The care management team will reach out to the patient again over the next 120 days.         Medication Assistance: None required.  Patient affirms current coverage meets needs.  Compliance/Adherence/Medication fill history: Care Gaps: Shingrix  Star-Rating Drugs: None yet, has not started statin  Patient's preferred pharmacy is:  [id number] [id number]: [phone]: [phone] CreekiGeorg[facility] Sp[facility] Center[facility]ort[facility] -[id number]: [phone]: [phone]-[id number] [place] -[id number]: [phone]: [phone] pill box? No - takes them out of vials Pt endorses 100% compliance  We discussed: Benefits of medication synchronization, packaging and delivery as well as enhanced pharmacist oversight with [employer]. Patient decided to: Continue current medication management strategy  Care Plan  and Follow Up Patient Decision:  Patient agrees to Care Plan and Follow-up.  Plan: The care management team will reach out to the patient again over the next 90 days.  ChriBeverly MilcharmD Clinical Pharmacist [phone]

## 2020-10-15 ENCOUNTER — Telehealth: Payer: Self-pay

## 2020-10-15 ENCOUNTER — Ambulatory Visit: Payer: Medicare Other | Admitting: Pharmacist

## 2020-10-15 DIAGNOSIS — K219 Gastro-esophageal reflux disease without esophagitis: Secondary | ICD-10-CM

## 2020-10-15 DIAGNOSIS — I10 Essential (primary) hypertension: Secondary | ICD-10-CM

## 2020-10-15 DIAGNOSIS — F411 Generalized anxiety disorder: Secondary | ICD-10-CM

## 2020-10-15 DIAGNOSIS — E782 Mixed hyperlipidemia: Secondary | ICD-10-CM

## 2020-10-15 NOTE — Telephone Encounter (Signed)
done

## 2020-10-15 NOTE — Patient Instructions (Signed)
Visit Information   Goals Addressed             This Visit's Progress    Track and Manage My Blood Pressure-Hypertension       Timeframe:  Long-Range Goal Priority:  High Start Date: 10/15/20                            Expected End Date:  04/16/21                     Follow Up Date 01/17/21    - check blood pressure 3 times per week - choose a place to take my blood pressure (home, clinic or office, retail store) - write blood pressure results in a log or diary    Why is this important?   You won't feel high blood pressure, but it can still hurt your blood vessels.  High blood pressure can cause heart or kidney problems. It can also cause a stroke.  Making lifestyle changes like losing a little weight or eating less salt will help.  Checking your blood pressure at home and at different times of the day can help to control blood pressure.  If the doctor prescribes medicine remember to take it the way the doctor ordered.  Call the office if you cannot afford the medicine or if there are questions about it.     Notes:         Patient Care Plan: General Pharmacy (Adult)     Problem Identified: HTN, COPD, GERD, GAD, HLD   Priority: High  Onset Date: 10/15/2020     Long-Range Goal: Patient-Specific Goal   Start Date: 10/15/2020  Expected End Date: 04/16/2021  This Visit's Progress: On track  Priority: High  Note:   Current Barriers:  Unable to achieve control of cholesterol.   Pharmacist Clinical Goal(s):  Patient will achieve control of cholesterol as evidenced by lipid panel adhere to plan to optimize therapeutic regimen for lipids as evidenced by report of adherence to recommended medication management changes contact provider office for questions/concerns as evidenced notation of same in electronic health record through collaboration with PharmD and provider.   Interventions: 1:1 collaboration with Lavera Guise, MD regarding development and update of  comprehensive plan of care as evidenced by provider attestation and co-signature Inter-disciplinary care team collaboration (see longitudinal plan of care) Comprehensive medication review performed; medication list updated in electronic medical record  Hypertension (BP goal <140/90) -Controlled -Current treatment: Amlodipine 2.5mg  daily Bisoprolol/HCTZ 2.5-6.25mg  daily -Medications previously tried: none noted  -Current home readings: 130s/80s -Current dietary habits: baked foods, water, lemonade with artificial sweetener, cereal -Current exercise habits: minimal besides yard work -Denies hypotensive/hypertensive symptoms -Educated on BP goals and benefits of medications for prevention of heart attack, stroke and kidney damage; Importance of home blood pressure monitoring; Symptoms of hypotension and importance of maintaining adequate hydration; -Counseled to monitor BP at home a few times per week, document, and provide log at future appointments -Recommended to continue current medication  Hyperlipidemia: (LDL goal < 100) -Uncontrolled -Current treatment: Rosuvastatin 5mg  daily -Medications previously tried: Lipitor (cramps)  -Current dietary patterns: see above -Current exercise habits: see above -Educated on Cholesterol goals;  Benefits of statin for ASCVD risk reduction; Importance of limiting foods high in cholesterol; -She still has not started her Crestor because she went to pick it up at the pharmacy and it was > $100 for a 90 days supply.   -  Discussed alternative statins she could use, she is going to check on the price using GoodRx at [facility], since she already gets another medication there using a coupon.   -Discussed the importance of taking something based on most recent LDL. -Counseled on diet and exercise extensively Recommended coupon, if that does not work will look to call in alternative statin.  Anxiety (Goal: Minimize symptoms) -Controlled -Current  treatment: Duloxetine 30mg  daily -Medications previously tried/failed: Buspirone, escitalopram -PHQ9:  Depression screen [facility] 2/9 09/09/2020 03/04/2020 12/01/2019 10/20/2019 09/07/2019  Decreased Interest 0 0 0 1 0  Down, Depressed, Hopeless 0 0 0 0 0  PHQ - 2 Score 0 0 0 1 0  -GAD7: No flowsheet data found.  -Patient reports meed and sleep have been good lately -Duloxetine has really made a difference once she switched to this. -Educated on Benefits of medication for symptom control -Recommended to continue current medication  GERD (Goal: Minimize symptoms) -Controlled -Current treatment  Pantoprazole 40mg  twice daily -Medications previously tried: none noted -Still having sporadic episodes of stomach pain/fullness.  She is scheduled to see a GI specialist about this ongoing issue. -Takes medication appropriately  -Recommended to continue current medication  Patient Goals/Self-Care Activities Patient will:  - take medications as prescribed focus on medication adherence by pill counts check blood pressure a few times weekly, document, and provide at future appointments target a minimum of 150 minutes of moderate intensity exercise weekly  Follow Up Plan: The care management team will reach out to the patient again over the next 120 days.        Ms. Shearer was given information about Chronic Care Management services today including:  CCM service includes personalized support from designated clinical staff supervised by her physician, including individualized plan of care and coordination with other care providers 24/7 contact phone numbers for assistance for urgent and routine care needs. Standard insurance, coinsurance, copays and deductibles apply for chronic care management only during months in which we provide at least 20 minutes of these services. Most insurances cover these services at 100%, however patients may be responsible for any copay, coinsurance and/or deductible if  applicable. This service may help you avoid the need for more expensive face-to-face services. Only one practitioner may furnish and bill the service in a calendar month. The patient may stop CCM services at any time (effective at the end of the month) by phone call to the office staff.  Patient agreed to services and verbal consent obtained.   The patient verbalized understanding of instructions, educational materials, and care plan provided today and agreed to receive a mailed copy of patient instructions, educational materials, and care plan.  Telephone follow up appointment with pharmacy team member scheduled for: 4 months  Edythe Clarity, [place]

## 2020-10-15 NOTE — Telephone Encounter (Signed)
Perfect, thank you

## 2020-10-17 DIAGNOSIS — I10 Essential (primary) hypertension: Secondary | ICD-10-CM | POA: Diagnosis not present

## 2020-10-17 DIAGNOSIS — J449 Chronic obstructive pulmonary disease, unspecified: Secondary | ICD-10-CM | POA: Diagnosis not present

## 2020-10-31 ENCOUNTER — Other Ambulatory Visit: Payer: Self-pay

## 2020-10-31 ENCOUNTER — Ambulatory Visit
Admission: RE | Admit: 2020-10-31 | Discharge: 2020-10-31 | Disposition: A | Discharge status: Home / Self Care | Payer: Medicare Other | Source: Ambulatory Visit | Attending: Physician Assistant | Admitting: Physician Assistant

## 2020-10-31 DIAGNOSIS — Z1231 Encounter for screening mammogram for malignant neoplasm of breast: Secondary | ICD-10-CM | POA: Insufficient documentation

## 2020-11-07 ENCOUNTER — Telehealth: Payer: Self-pay | Admitting: Pharmacist

## 2020-11-07 NOTE — Progress Notes (Addendum)
    Chronic Care Management Pharmacy Assistant   Name: Shannon Li  MRN: [id number] DOB: 05/12/1950  Reason for Encounter: Disease [place].   Conditions to be addressed/monitored: HTN, COPD, GERD, GAD  Recent office visits:  None since 10/15/20  Recent consult visits:  None since 10/15/20  Hospital visits:  None since 10/15/20  Medications: Outpatient Encounter Medications as of 11/07/2020  Medication Sig   amLODipine (NORVASC) 2.5 MG tablet Take one tablet by mouth daily.   bisoprolol-hydrochlorothiazide (ZIAC) 2.5-6.25 MG tablet Take 1 tablet po qd for blood pressure   DULoxetine (CYMBALTA) 30 MG capsule TAKE 1 CAPSULE BY MOUTH EVERY DAY   pantoprazole (PROTONIX) 40 MG tablet Take 1 tablet two times daily   rosuvastatin (CRESTOR) 5 MG tablet Take 1 tablet by mouth at night   No facility-administered encounter medications on file as of 11/07/2020.    11/07/2020 Name: Shannon Li MRN: [id number] DOB: 1951/03/03 [name] is a 70 y.o. year old female who is a primary care patient of Lavera Guise, MD.  Comprehensive medication review performed; Spoke to patient regarding cholesterol  Lipid Panel    Component Value Date/Time   CHOL 238 (H) 09/11/2020 0924   TRIG 229 (H) 09/11/2020 0924   HDL 37 (L) 09/11/2020 0924   LDLCALC 159 (H) 09/11/2020 0388    10-year ASCVD risk score: The 10-year ASCVD risk score Mikey Bussing DC Brooke Bonito., et al., 2013) is: 33.8%   Values used to calculate the score:     Age: 72 years     Sex: Female     Is Non-Hispanic African American: No     Diabetic: No     Tobacco smoker: Yes     Systolic Blood Pressure: 828 mmHg     Is BP treated: Yes     HDL Cholesterol: 37 mg/dL     Total Cholesterol: 238 mg/dL  Current antihyperlipidemic regimen:  Rosuvastatin 5mg  daily  Previous antihyperlipidemic medications tried: Lipitor (cramps)  ASCVD risk enhancing conditions: age >63 and HTN  What recent interventions/DTPs have been made by  any provider to improve Cholesterol control since last CPP Visit: None.   Any recent hospitalizations or ED visits since last visit with CPP? Patient stated no.  What diet changes have been made to improve Cholesterol?  Patient stated she is trying to eat more vegetables. Patient stated she is cutting back on sweets and bread.  What exercise is being done to improve Cholesterol?  Patient stated not has changed much of her daily activities but she is active around the house.   Adherence Review: Does the patient have >5 day gap between last estimated fill dates? Per misc rpts, no.   Star Rating Drugs:  Rosuvastatin 5 mg 90 DS 10/11/20.  Patient stated she is tolerating her statin medications well, she has not noticed anything different.   Follow-Up:Pharmacist Review  [name], RMA Clinical Pharmacist Assistant [phone]  10 minutes spent in review, coordination, and documentation.  Reviewed by: Beverly Milch, PharmD Clinical Pharmacist [phone]

## 2020-11-17 DIAGNOSIS — J449 Chronic obstructive pulmonary disease, unspecified: Secondary | ICD-10-CM | POA: Diagnosis not present

## 2020-11-17 DIAGNOSIS — I10 Essential (primary) hypertension: Secondary | ICD-10-CM | POA: Diagnosis not present

## 2020-11-19 DIAGNOSIS — K219 Gastro-esophageal reflux disease without esophagitis: Secondary | ICD-10-CM | POA: Diagnosis not present

## 2020-11-19 DIAGNOSIS — R6881 Early satiety: Secondary | ICD-10-CM | POA: Diagnosis not present

## 2020-11-19 DIAGNOSIS — R197 Diarrhea, unspecified: Secondary | ICD-10-CM | POA: Diagnosis not present

## 2020-11-19 DIAGNOSIS — R634 Abnormal weight loss: Secondary | ICD-10-CM | POA: Diagnosis not present

## 2020-11-20 ENCOUNTER — Telehealth: Payer: Self-pay

## 2020-11-20 NOTE — Telephone Encounter (Signed)
Called [facility] for bill pt received and submitted more ICD 10 codes they will resubmitted to insurance take about 30 to 45 days and pt aware

## 2020-11-26 ENCOUNTER — Telehealth: Payer: Self-pay

## 2020-11-26 NOTE — Telephone Encounter (Signed)
Spoke to pt and she advised that she has been keeping a check on her Bp.  She went to see the [facility] dr and stated her bp was good there at 137/87 and they are doing a colonoscopy and endoscopy on her Nov 14, 22.  ,made pt an appt for 4 weeks to see DSK and she will bring Bp readings with her.

## 2020-11-28 DIAGNOSIS — H43813 Vitreous degeneration, bilateral: Secondary | ICD-10-CM | POA: Diagnosis not present

## 2020-12-24 ENCOUNTER — Encounter: Payer: Self-pay | Admitting: Internal Medicine

## 2020-12-24 ENCOUNTER — Other Ambulatory Visit: Payer: Self-pay

## 2020-12-24 ENCOUNTER — Ambulatory Visit (INDEPENDENT_AMBULATORY_CARE_PROVIDER_SITE_OTHER): Payer: Medicare Other | Admitting: Internal Medicine

## 2020-12-24 VITALS — BP 130/72 | HR 76 | Temp 98.1°F | Resp 16 | Ht 68.0 in | Wt 181.0 lb

## 2020-12-24 DIAGNOSIS — I10 Essential (primary) hypertension: Secondary | ICD-10-CM

## 2020-12-24 DIAGNOSIS — M8588 Other specified disorders of bone density and structure, other site: Secondary | ICD-10-CM | POA: Diagnosis not present

## 2020-12-24 DIAGNOSIS — Z23 Encounter for immunization: Secondary | ICD-10-CM

## 2020-12-24 DIAGNOSIS — J432 Centrilobular emphysema: Secondary | ICD-10-CM | POA: Diagnosis not present

## 2020-12-24 DIAGNOSIS — R102 Pelvic and perineal pain unspecified side: Secondary | ICD-10-CM

## 2020-12-24 DIAGNOSIS — N1832 Chronic kidney disease, stage 3b: Secondary | ICD-10-CM

## 2020-12-24 DIAGNOSIS — N898 Other specified noninflammatory disorders of vagina: Secondary | ICD-10-CM | POA: Diagnosis not present

## 2020-12-24 DIAGNOSIS — I700 Atherosclerosis of aorta: Secondary | ICD-10-CM

## 2020-12-24 MED ORDER — TETANUS-DIPHTH-ACELL PERTUSSIS 5-2.5-18.5 LF-MCG/0.5 IM SUSP: 0.5000 mL | Freq: Once | INTRAMUSCULAR | 0 refills | Status: AC

## 2020-12-24 MED ORDER — AMLODIPINE BESYLATE 5 MG PO TABS: 5.0000 mg | ORAL_TABLET | Freq: Every day | ORAL | 1 refills | Status: DC

## 2020-12-24 MED ORDER — ZOSTER VAC RECOMB ADJUVANTED 50 MCG/0.5ML IM SUSR: 0.5000 mL | Freq: Once | INTRAMUSCULAR | 0 refills | Status: AC

## 2020-12-24 NOTE — Addendum Note (Signed)
Addended by: Jimmye Norman on: 12/24/2020 03:26 PM   Modules accepted: Orders

## 2020-12-24 NOTE — Progress Notes (Signed)
[facility] [place], [place] [address]  Internal MEDICINE  Office Visit Note  Patient Name: Shannon Li  [id number]  [id number]  Date of Service: 12/24/2020  Chief Complaint  Patient presents with   Hypertension    Follow up   pain in hip    Left hip   left arm pain    Upper arm    HPI Patient is here for routine follow-up, -Her labs reviewed, she has elevated creatinine with reduced EGFR patient has 1 kidney atrophy of right kidney over 15 years. -Blood pressure is improved however need better control. -Patient will be due for BMD -She is complaining of pelvic pain with discharge and foul odor, she is not sexually active and is menopausal does not recall having any surgeries -Low-dose CT chest results were also discussed and is follows  1. Lung-RADS 2, benign appearance or behavior. Continue annual screening with low-dose chest CT without contrast in 12 months. 2. Atrophic right kidney. 3. Aortic Atherosclerosis (ICD10-I70.0) and Emphysema (ICD10-J43.9).  Current Medication: Outpatient Encounter Medications as of 12/24/2020  Medication Sig   Tdap (BOOSTRIX) 5-2.5-18.5 LF-MCG/0.5 injection Inject 0.5 mLs into the muscle once for 1 dose.   Zoster Vaccine Adjuvanted [facility]) injection Inject 0.5 mLs into the muscle once for 1 dose.   amLODipine (NORVASC) 2.5 MG tablet Take one tablet by mouth daily.   bisoprolol-hydrochlorothiazide (ZIAC) 2.5-6.25 MG tablet Take 1 tablet po qd for blood pressure   DULoxetine (CYMBALTA) 30 MG capsule TAKE 1 CAPSULE BY MOUTH EVERY DAY   pantoprazole (PROTONIX) 40 MG tablet Take 1 tablet two times daily   rosuvastatin (CRESTOR) 5 MG tablet Take 1 tablet by mouth at night   No facility-administered encounter medications on file as of 12/24/2020.    Surgical History: Past Surgical History:  Procedure Laterality Date   ABSCESS DRAINAGE  11/17/13   buttock right   BREAST BIOPSY Right    neg   CATARACT EXTRACTION  W/PHACO Right 08/17/2019   Procedure: CATARACT EXTRACTION PHACO AND INTRAOCULAR LENS PLACEMENT (IOC) RIGHT VISION BLUE 5.32  00:42.7;  Surgeon: Marchia Meiers, MD;  Location: [facility];  Service: Ophthalmology;  Laterality: Right;   CATARACT EXTRACTION W/PHACO Left 09/21/2019   Procedure: CATARACT EXTRACTION PHACO AND INTRAOCULAR LENS PLACEMENT ([place]) LEFT VISION BLUE;  Surgeon: Marchia Meiers, MD;  [facility]: [place];  Service: Ophthalmology;  Laterality: Left;  6.24 0:40.6   CHOLECYSTECTOMY  2008   COLONOSCOPY  2014   [facility] [place] N/A 08/26/2015   Procedure: COLONOSCOPY WITH PROPOFOL;  Surgeon: Hulen Luster, MD;  Location: [facility] ENDOSCOPY;  Service: Gastroenterology;  Laterality: N/A;   ESOPHAGOGASTRODUODENOSCOPY N/A 11/15/2014   Procedure: ESOPHAGOGASTRODUODENOSCOPY (EGD);  Surgeon: Hulen Luster, MD;  Location: [facility]COPY;  Service: Gastroenterology;  Laterality: N/A;   UPPER GI ENDOSCOPY  2014   [facility] [place] History: Past Medical History:  Diagnosis Date   Anxiety    Arthritis    Atrophic kidney    Chronic constipation    Colon polyp 2013   COPD (chronic obstructive pulmonary disease) (HCC)    Depression    Diverticulosis    Esophageal stricture    Esophagitis    Fibrocystic breast disease    GERD (gastroesophageal reflux disease)    History of hiatal hernia    Hypertension    IBS (irritable bowel syndrome)    Menopause    Ovarian cyst  Plantar fasciitis    Postmenopausal    Renal artery occlusion (HCC)    Renal insufficiency    Right kidney is small and not functioning.    Family History: Family History  Problem Relation Age of Onset   Heart disease Mother    Heart attack Mother    Diabetes Mother    Alzheimer's disease Father    Breast cancer Sister 64   Lung cancer Brother    Brain cancer Brother    Lung cancer Sister    Bladder Cancer Sister    Lung cancer Brother     Leukemia Brother    Diabetes Brother     Social History   Socioeconomic History   Marital status: Widowed    Spouse name: Not on file   Number of children: Not on file   Years of education: Not on file   Highest education level: Not on file  Occupational History   Not on file  Tobacco Use   Smoking status: Every Day    Packs/day: 0.50    Years: 30.00    Pack years: 15.00    Types: Cigarettes   Smokeless tobacco: Never   Tobacco comments:    since age 41, pt trying to cut back  Vaping Use   Vaping Use: Never used  Substance and Sexual Activity   Alcohol use: No   Drug use: No   Sexual activity: Not on file  Other Topics Concern   Not on file  Social History Narrative   Not on file   Social Determinants of Health   Financial Resource Strain: Low Risk    Difficulty of Paying Living Expenses: Not very hard  Food Insecurity: Not on file  Transportation Needs: Not on file  Physical Activity: Not on file  Stress: Not on file  Social Connections: Not on file  Intimate Partner Violence: Not on file      Review of Systems  Constitutional:  Negative for chills, fatigue and unexpected weight change.  HENT:  Positive for postnasal drip. Negative for congestion, rhinorrhea, sneezing and sore throat.   Eyes:  Negative for redness.  Respiratory:  Negative for cough, chest tightness and shortness of breath.   Cardiovascular:  Negative for chest pain and palpitations.  Gastrointestinal:  Negative for abdominal pain, constipation, diarrhea, nausea and vomiting.  Genitourinary:  Positive for pelvic pain and vaginal discharge. Negative for dysuria and frequency.  Musculoskeletal:  Negative for arthralgias, back pain, joint swelling and neck pain.  Skin:  Negative for rash.  Neurological: Negative.  Negative for tremors and numbness.  Hematological:  Negative for adenopathy. Does not bruise/bleed easily.  Psychiatric/Behavioral:  Negative for behavioral problems (Depression),  sleep disturbance and suicidal ideas. The patient is not nervous/anxious.    Vital Signs: BP 130/72   Pulse 76   Temp 98.1 F (36.7 C)   Resp 16   Ht _0  (1.727 m)   Wt 181 lb (82.1 kg)   SpO2 97%   BMI 27.52 kg/m    Physical Exam Constitutional:      Appearance: Normal appearance.  HENT:     Head: Normocephalic and atraumatic.     Nose: Nose normal.     Mouth/Throat:     Mouth: Mucous membranes are moist.     Pharynx: No posterior oropharyngeal erythema.  Eyes:     Extraocular Movements: Extraocular movements intact.     Pupils: Pupils are equal, round, and reactive to light.  Cardiovascular:  Pulses: Normal pulses.     Heart sounds: Normal heart sounds.  Pulmonary:     Effort: Pulmonary effort is normal.     Breath sounds: Normal breath sounds.  Genitourinary:    General: Normal vulva.     Vagina: Vaginal discharge present.  Neurological:     General: No focal deficit present.     Mental Status: She is alert.  Psychiatric:        Mood and Affect: Mood normal.        Behavior: Behavior normal.    Assessment/Plan: 1. Stage 3b chronic kidney disease ([place]) We will continue to monitor, history of right atrophic kidney, it is important to control her blood pressure  2. Vaginal discharge On pelvic exam there is odor STD screening is performed sample was taken and sent to the lab, there is discharge we will order ultrasound of the pelvis - US PELVIS (TRANSABDOMINAL ONLY); Future  3. Pelvic pain We will order ultrasound of the pelvis for further diagnostic and treatment - US PELVIS (TRANSABDOMINAL ONLY); Future  4. Aortic atherosclerosis ([place]) Patient is on Crestor we will continue to monitor  5. Centrilobular emphysema ([place]) Patient is current smoker, will order PFTs samples of Breztri also given 2 PUFFS ONCE A DAY, PFTs are ordered patient need to see pulmonary - Pulmonary Function Test; Future  6. Osteopenia of lumbar spine Patient has history of  osteopenia on BMD has not been ordered in 3 years we will order on next visit  7. Benign hypertension Increase amlodipine to 5 mg once a day, continue bisoprolol as before, will order basic metabolic panel next visit as well - amLODipine (NORVASC) 5 MG tablet; Take 1 tablet (5 mg total) by mouth daily.  Dispense: 90 tablet; Refill: 1  8. Need for shingles vaccine - Zoster Vaccine Adjuvanted [facility]) injection; Inject 0.5 mLs into the muscle once for 1 dose.  Dispense: 0.5 mL; Refill: 0  9. Need for diphtheria-tetanus-pertussis (Tdap) vaccine - Tdap (BOOSTRIX) 5-2.5-18.5 LF-MCG/0.5 injection; Inject 0.5 mLs into the muscle once for 1 dose.  Dispense: 0.5 mL; Refill: 0   General Counseling: charlese gruetzmacher understanding of the findings of todays visit and agrees with plan of treatment. I have discussed any further diagnostic evaluation that may be needed or ordered today. We also reviewed her medications today. she has been encouraged to call the office with any questions or concerns that should arise related to todays visit.    Orders Placed This Encounter  Procedures   US PELVIS (TRANSABDOMINAL ONLY)   Pulmonary Function Test    Meds ordered this encounter  Medications   Tdap (BOOSTRIX) 5-2.5-18.5 LF-MCG/0.5 injection    Sig: Inject 0.5 mLs into the muscle once for 1 dose.    Dispense:  0.5 mL    Refill:  0   Zoster Vaccine Adjuvanted [facility]) injection    Sig: Inject 0.5 mLs into the muscle once for 1 dose.    Dispense:  0.5 mL    Refill:  0    Total time spent:35 Minutes Time spent includes review of chart, medications, test results, and follow up plan with the patient.    Controlled Substance Database was reviewed by me.   Dr Lavera Guise Internal medicine

## 2020-12-26 LAB — NUSWAB VAGINITIS PLUS (VG+)
Candida albicans, NAA: NEGATIVE
Candida glabrata, NAA: NEGATIVE
Chlamydia trachomatis, NAA: NEGATIVE
Neisseria gonorrhoeae, NAA: NEGATIVE
Trich vag by NAA: NEGATIVE

## 2021-01-08 ENCOUNTER — Ambulatory Visit (INDEPENDENT_AMBULATORY_CARE_PROVIDER_SITE_OTHER): Payer: Medicare Other | Admitting: Internal Medicine

## 2021-01-08 ENCOUNTER — Other Ambulatory Visit: Payer: Self-pay

## 2021-01-08 DIAGNOSIS — J432 Centrilobular emphysema: Secondary | ICD-10-CM | POA: Diagnosis not present

## 2021-01-08 LAB — PULMONARY FUNCTION TEST

## 2021-01-10 ENCOUNTER — Telehealth: Payer: Self-pay

## 2021-01-10 NOTE — Telephone Encounter (Signed)
Left vm to confirm 01/15/21 appointment-Toni

## 2021-01-15 ENCOUNTER — Other Ambulatory Visit: Payer: Self-pay

## 2021-01-15 ENCOUNTER — Ambulatory Visit (INDEPENDENT_AMBULATORY_CARE_PROVIDER_SITE_OTHER): Payer: Medicare Other

## 2021-01-15 DIAGNOSIS — N898 Other specified noninflammatory disorders of vagina: Secondary | ICD-10-CM

## 2021-01-15 DIAGNOSIS — R102 Pelvic and perineal pain unspecified side: Secondary | ICD-10-CM

## 2021-01-19 NOTE — Procedures (Signed)
[facility] [address] [place], [address]    Complete Pulmonary Function Testing Interpretation:  FINDINGS:  The forced vital capacity is mildly decreased.  FEV1 was 1.3 L which is 53% of predicted and is moderately decreased.  FEV1 FVC ratio is moderately decreased.  Postbronchodilator there is no significant improvement in the FEV1 however clinical improvement may occur in the absence of spirometric improvement and does not preclude the use of bronchodilators.  Total lung capacity is normal residual volume is increased residual internal capacity ratio is increased FRC is increased.  DLCO was moderately decreased.  IMPRESSION:  This pulmonary function study is consistent with moderate obstructive lung disease  Allyne Gee, MD [facility] Pulmonary Critical Care Medicine Sleep Medicine

## 2021-02-03 NOTE — Progress Notes (Signed)
Chronic Care Management Pharmacy Note  02/14/2021 Name:  Shannon Li MRN:  [id number] DOB:  1950-05-30  Summary: No longer taking statin due to shoulder pain.  Also not using Breo daily due to cost concern.. BP has improved at home.  Recommendations/Changes made from today's visit: Restart Crestor if pain does not improve with trial off PAP for Breo so she can take daily  Plan: Patient to call back to let us know what she decides FU on phone in 4 months   Subjective: KENTLEY CEDILLO is an 70 y.o. year old female who is a primary patient of Humphrey Rolls, Timoteo Gaul, MD.  The CCM team was consulted for assistance with disease management and care coordination needs.    Engaged with patient by telephone for follow up visit in response to provider referral for pharmacy case management and/or care coordination services.   Consent to Services:  The patient was given the following information about Chronic Care Management services today, agreed to services, and gave verbal consent: 1. CCM service includes personalized support from designated clinical staff supervised by the primary care provider, including individualized plan of care and coordination with other care providers 2. 24/7 contact phone numbers for assistance for urgent and routine care needs. 3. Service will only be billed when office clinical staff spend 20 minutes or more in a month to coordinate care. 4. Only one practitioner may furnish and bill the service in a calendar month. 5.The patient may stop CCM services at any time (effective at the end of the month) by phone call to the office staff. 6. The patient will be responsible for cost sharing (co-pay) of up to 20% of the service fee (after annual deductible is met). Patient agreed to services and consent obtained.  Patient Care Team: Lavera Guise, MD as PCP - General (Internal Medicine) Lavera Guise, MD (Internal Medicine) Bary Castilla, Forest Gleason, MD (General Surgery) Edythe Clarity, [facility] (Pharmacist) Edythe Clarity, [facility] as Pharmacist (Pharmacist)  Recent office visits:  09/09/20 Mylinda Latina, PA-C. For general adult medical examination. No medication changes.    Recent consult visits:  10/02/20 Oncology (Video Visit) Borders, Kirt Boys, NP. For follow-up. No medication changes. 05/08/20 Dermatology Dasher, Mor[place] No information given.    Hospital visits:  None in previous 6 months   Medication History: N/A   Objective:  Lab Results  Component Value Date   CREATININE 1.52 (H) 09/11/2020   BUN 20 09/11/2020   GFRNONAA 38 (L) 09/07/2019   GFRAA 44 (L) 09/07/2019   NA 140 09/11/2020   K 4.8 09/11/2020   CALCIUM 9.7 09/11/2020   CO2 26 09/11/2020   GLUCOSE 103 (H) 09/11/2020    Lab Results  Component Value Date/Time   HGBA1C 5.8 (H) 09/07/2019 10:40 AM    Last diabetic Eye exam: No results found for: HMDIABEYEEXA  Last diabetic Foot exam: No results found for: HMDIABFOOTEX   Lab Results  Component Value Date   CHOL 238 (H) 09/11/2020   HDL 37 (L) 09/11/2020   LDLCALC 159 (H) 09/11/2020   TRIG 229 (H) 09/11/2020    Hepatic Function Latest Ref Rng & Units 09/11/2020 09/07/2019 08/22/2018  Total Protein 6.0 - 8.5 g/dL 6.9 6.6 6.5  Albumin 3.7 - 4.7 g/dL 4.5 4.1 4.3  AST 0 - 40 IU/L 18 18 18   ALT 0 - 32 IU/L 11 10 13   Alk Phosphatase 44 - 121 IU/L 90 78 82  Total Bilirubin 0.0 -  1.2 mg/dL 0.3 0.4 0.3    Lab Results  Component Value Date/Time   TSH 1.840 09/11/2020 09:24 AM   TSH 2.110 09/07/2019 10:40 AM   FREET4 1.34 09/11/2020 09:24 AM   FREET4 1.32 09/07/2019 10:40 AM    CBC Latest Ref Rng & Units 09/11/2020 09/07/2019 08/22/2018  WBC 3.4 - 10.8 x10E3/uL 6.7 6.7 6.5  Hemoglobin 11.1 - 15.9 g/dL 15.2 15.4 14.9  Hematocrit 34.0 - 46.6 % 46.3 45.9 45.4  Platelets 150 - 450 x10E3/uL 373 292 297    Lab Results  Component Value Date/Time   VD25OH 29.4 (L) 09/11/2020 09:24 AM   VD25OH 32.9 09/07/2019 10:40 AM     Clinical ASCVD: No  The 10-year ASCVD risk score (Arnett DK, et al., 2019) is: 23.7%   Values used to calculate the score:     Age: 70 years     Sex: Female     Is Non-Hispanic African American: No     Diabetic: No     Tobacco smoker: Yes     Systolic Blood Pressure: 643 mmHg     Is BP treated: Yes     HDL Cholesterol: 37 mg/dL     Total Cholesterol: 238 mg/dL    Depression screen [facility] 2/9 12/24/2020 09/09/2020 03/04/2020  Decreased Interest 0 0 0  Down, Depressed, Hopeless 0 0 0  PHQ - 2 Score 0 0 0     Social History   Tobacco Use  Smoking Status Every Day   Packs/day: 0.50   Years: 30.00   Pack years: 15.00   Types: Cigarettes  Smokeless Tobacco Never  Tobacco Comments   since age 19, pt trying to cut back   BP Readings from Last 3 Encounters:  02/14/21 128/75  02/06/21 (!) 152/73  12/24/20 130/72   Pulse Readings from Last 3 Encounters:  02/06/21 60  12/24/20 76  09/09/20 90   Wt Readings from Last 3 Encounters:  02/06/21 182 lb (82.6 kg)  12/24/20 181 lb (82.1 kg)  10/02/20 186 lb (84.4 kg)   BMI Readings from Last 3 Encounters:  02/06/21 27.67 kg/m  12/24/20 27.52 kg/m  10/02/20 28.28 kg/m    Assessment/Interventions: Review of patient past medical history, allergies, medications, health status, including review of consultants reports, laboratory and other test data, was performed as part of comprehensive evaluation and provision of chronic care management services.   SDOH:  (Social Determinants of Health) assessments and interventions performed: Yes  Financial Resource Strain: Low Risk    Difficulty of Paying Living Expenses: Not very hard    SDOH Screenings   Alcohol Screen: Low Risk    Last Alcohol Screening Score (AUDIT): 0  Depression (PHQ2-9): Low Risk    PHQ-2 Score: 0  Financial Resource Strain: Low Risk    Difficulty of Paying Living Expenses: Not very hard  Food Insecurity: Not on file  Housing: Not on file  Physical Activity:  Not on file  Social Connections: Not on file  Stress: Not on file  Tobacco Use: High Risk   Smoking Tobacco Use: Every Day   Smokeless Tobacco Use: Never   Passive Exposure: Not on file  Transportation Needs: Not on file    [place]  Allergies  Allergen Reactions   Bupropion Nausea And Vomiting   Penicillin V Potassium Other (See Comments)   Penicillins Hives    Medications Reviewed Today     Reviewed by Edythe Clarity, [facility] (Pharmacist) on 02/14/21 at 1426  Med List  Status: <None>   Medication Order Taking? Sig Documenting Provider Last Dose Status Informant  amLODipine (NORVASC) 5 MG tablet [id number] Yes Take 1 tablet (5 mg total) by mouth daily. Lavera Guise, MD Taking Active   bisoprolol-hydrochlorothiazide [facility]) 2.5-6.25 MG tablet [id number] Yes Take 1 tablet po qd for blood pressure Lavera Guise, MD Taking Active   DULoxetine (CYMBALTA) 30 MG capsule [id number] Yes TAKE 1 CAPSULE BY MOUTH EVERY DAY Lavera Guise, MD Taking Active   fluticasone furoate-vilanterol (BREO ELLIPTA) 100-25 MCG/ACT AEPB [id number] Yes Inhale 1 puff into the lungs daily. McDonough, Si Gaul, PA-C Taking Active   pantoprazole (PROTONIX) 40 MG tablet [id number] Yes Take 1 tablet two times daily Lavera Guise, MD Taking Active             Patient Active Problem List   Diagnosis Date Noted   Needs flu shot 03/04/2020   Cervical disc disease with myelopathy 10/28/2019   Degenerative disc disease, thoracic 10/28/2019   Gastroesophageal reflux disease without esophagitis 10/03/2018   Generalized anxiety disorder 10/03/2018   Chronic obstructive pulmonary disease ([place]) 09/18/2018   Acute upper respiratory infection 03/03/2018   Cough 03/03/2018   Bereavement due to life event 03/03/2018   Encounter for general adult medical examination with abnormal findings 08/24/2017   Dyspnea on exertion 08/24/2017   Dysuria 08/24/2017   Need for vaccination against Streptococcus pneumoniae using  pneumococcal conjugate vaccine 13 08/24/2017   Essential hypertension 06/02/2017   Inguinal abscess 05/23/2014   Cutaneous abscess of groin 05/23/2014   Cellulitis and abscess of trunk 05/23/2014   Ovarian cyst, complex 04/06/2012   Other ovarian cyst, unspecified side 04/06/2012   Ovarian retention cyst 04/06/2012    Immunization History  Administered Date(s) Administered   Influenza Inj Mdck Quad Pf 04/09/2017, 03/04/2020   Influenza, High Dose Seasonal PF 02/17/2019   Influenza,inj,Quad PF,6+ Mos 04/06/2018   Influenza-Unspecified 02/17/2019   Pneumococcal Conjugate-13 08/24/2017   Pneumococcal Polysaccharide-23 09/16/2018, 09/16/2018    Conditions to be addressed/monitored:  HTN, COPD, GERD, GAD  Care Plan : General Pharmacy (Adult)  Updates made by Edythe Clarity, RPH since 02/14/2021 12:00 AM     Problem: HTN, COPD, GERD, GAD, HLD   Priority: High  Onset Date: 10/15/2020     Long-Range Goal: Patient-Specific Goal   Start Date: 10/15/2020  Expected End Date: 04/16/2021  Recent Progress: On track  Priority: High  Note:   Current Barriers:  Unable to achieve control of cholesterol.   Pharmacist Clinical Goal(s):  Patient will achieve control of cholesterol as evidenced by lipid panel adhere to plan to optimize therapeutic regimen for lipids as evidenced by report of adherence to recommended medication management changes contact provider office for questions/concerns as evidenced notation of same in electronic health record through collaboration with PharmD and provider.   Interventions: 1:1 collaboration with Lavera Guise, MD regarding development and update of comprehensive plan of care as evidenced by provider attestation and co-signature Inter-disciplinary care team collaboration (see longitudinal plan of care) Comprehensive medication review performed; medication list updated in electronic medical record  Hypertension (BP goal  <140/90) -Controlled -Current treatment: Amlodipine 2.38m daily Bisoprolol/HCTZ 2.5-6.25mg daily -Medications previously tried: none noted  -Current home readings: 130s/80s -Current dietary habits: baked foods, water, lemonade with artificial sweetener, cereal -Current exercise habits: minimal besides yard work -Denies hypotensive/hypertensive symptoms -Educated on BP goals and benefits of medications for prevention of heart attack, stroke and kidney damage; Importance of home blood pressure monitoring; Symptoms  of hypotension and importance of maintaining adequate hydration; -Counseled to monitor BP at home a few times per week, document, and provide log at future appointments -Recommended to continue current medication  Update 02/14/21 128-135/75-76 - home readings Denies any symptoms Elevated reading in office due to shoulder pain. Continue to monitor at home, adjust meds as needed but no changes at this time.  Hyperlipidemia: (LDL goal < 100) -Uncontrolled -Current treatment: Rosuvastatin 89m daily -Medications previously tried: Lipitor (cramps)  -Current dietary patterns: see above -Current exercise habits: see above -Educated on Cholesterol goals;  Benefits of statin for ASCVD risk reduction; Importance of limiting foods high in cholesterol; -She still has not started her Crestor because she went to pick it up at the pharmacy and it was > $100 for a 90 days supply.   -Discussed alternative statins she could use, she is going to check on the price [facility] since she already gets another medication there using a coupon.   -Discussed the importance of taking something based on most recent LDL. -Counseled on diet and exercise extensively Recommended coupon, if that does not work will look to call in alternative statin.  Update 02/14/21 She stopped her Crestor due to some ongoing pain in her shoulder for which she received cortisone injection.  At that  time plan was to remain off, if pain did not improve while she was off the statin, then she could resume. Counseled on benefits of statin medication, LDL goal.  IF pain does not improve would recommend her to start statin back as unlikely due to statin.  Has FU appt in November.  At that time could consider switching to something like Zetia 164mdaiaily, or injectable medication such as Repatha or Leqvio if oral medication not an option. Continue current management for now, would recommend recheck lipids at next OV due to LDL at previous labs. Anxiety (Goal: Minimize symptoms) -Controlled -Current treatment: Duloxetine 3063maily -Medications previously tried/failed: Buspirone, escitalopram -PHQ9:  Depression [facility] 09/09/2020 03/04/2020 12/01/2019 10/20/2019 09/07/2019  Decreased Interest 0 0 0 1 0  Down, Depressed, Hopeless 0 0 0 0 0  PHQ - 2 Score 0 0 0 1 0  -GAD7: No flowsheet data found.  -Patient reports meed and sleep have been good lately -Duloxetine has really made a difference once she switched to this. -Educated on Benefits of medication for symptom control -Recommended to continue current medication  GERD (Goal: Minimize symptoms) -Controlled -Current treatment  Pantoprazole 66m61mice daily -Medications previously tried: none noted -Still having sporadic episodes of stomach pain/fullness.  She is scheduled to see a GI specialist about this ongoing issue. -Takes medication appropriately  -Recommended to continue current medication  Patient Goals/Self-Care Activities Patient will:  - take medications as prescribed focus on medication adherence by pill counts check blood pressure a few times weekly, document, and provide at future appointments target a minimum of 150 minutes of moderate intensity exercise weekly  Follow Up Plan: The care management team will reach out to the patient again over the next 120 days.             Medication Assistance: Application  for Breo  medication assistance program. in process.  Anticipated assistance start date unknown.  See plan of care for additional detail.  She is not taking her Breo every day due to high copay, will start PAP and mail it to her to try and alleviate this cost burden.  Compliance/Adherence/Medication fill history: Care Gaps:  Shingrix  Star-Rating Drugs: None yet, has not started statin  Patient's preferred pharmacy is:  [id number] [place] [place] [place]Phone: [phone]: [phone] [place]([facility] [place]: [phone]: [id number]on [place]Phone: [phone]: [phone] Uses pill box? No - takes them out of vials Pt endorses 100% compliance  We discussed: Benefits of medication synchronization, packaging and delivery as well as enhanced pharmacist oversight with [employer]. Patient decided to: Continue current medication management strategy  Care Plan and Follow Up Patient Decision:  Patient agrees to Care Plan and Follow-up.  Plan: The care management team will reach out to the patient again over the next 90 days.  CBeverly Milch PharmD Clinical Pharmacist NPome[phone]

## 2021-02-06 ENCOUNTER — Ambulatory Visit (INDEPENDENT_AMBULATORY_CARE_PROVIDER_SITE_OTHER): Payer: Medicare Other | Admitting: Physician Assistant

## 2021-02-06 ENCOUNTER — Encounter: Payer: Self-pay | Admitting: Physician Assistant

## 2021-02-06 ENCOUNTER — Other Ambulatory Visit: Payer: Self-pay

## 2021-02-06 DIAGNOSIS — M8588 Other specified disorders of bone density and structure, other site: Secondary | ICD-10-CM | POA: Diagnosis not present

## 2021-02-06 DIAGNOSIS — I10 Essential (primary) hypertension: Secondary | ICD-10-CM

## 2021-02-06 DIAGNOSIS — J432 Centrilobular emphysema: Secondary | ICD-10-CM | POA: Diagnosis not present

## 2021-02-06 DIAGNOSIS — M25512 Pain in left shoulder: Secondary | ICD-10-CM | POA: Diagnosis not present

## 2021-02-06 DIAGNOSIS — N83201 Unspecified ovarian cyst, right side: Secondary | ICD-10-CM

## 2021-02-06 DIAGNOSIS — I700 Atherosclerosis of aorta: Secondary | ICD-10-CM

## 2021-02-06 DIAGNOSIS — N1832 Chronic kidney disease, stage 3b: Secondary | ICD-10-CM

## 2021-02-06 MED ORDER — FLUTICASONE FUROATE-VILANTEROL 100-25 MCG/ACT IN AEPB: 1.0000 | INHALATION_SPRAY | Freq: Every day | RESPIRATORY_TRACT | 11 refills | Status: DC

## 2021-02-06 NOTE — Progress Notes (Signed)
[facility] [place], [place] [address]  Internal MEDICINE  Office Visit Note  Patient Name: Shannon Li  [id number]  [id number]  Date of Service: 02/07/2021  Chief Complaint  Patient presents with   Follow-up    Left shoulder pain, pt can feel pain down arm and up neck, arm/shoulder pops when she lifts it    Anxiety   Depression   Gastroesophageal Reflux   Hypertension   COPD    HPI Pt is here for routine follow up with acute concerns -Left shoulder limited ROM and painful for last 3-4 weeks. Tried aleve. Does not sleep on that side. No falls or known injury. Biofreeze helps a little. Poping sound. -will try voltaren gel to limit impact on kidney of oral NSAIDs -Was planning to order BMD, but will hold off while acute shoulder pain -Breo sample inhaler given last visit and it has helped some. Smokes 1/2 ppd which is an improvement. Will try sending script, but may need alt pending costs. Pt advised to discuss with pharmacy/insurance if cost is too high to determine preferred option. Smaple of anoro given just in case breo too expensive. -Reviewed PFT did show moderate obstructive lung disease -Also reviewed pelvic US which did show normal uterus and endometrium, but did show well-circumscribed simple appearing cystic development measuring 2.8 x 2.7cm exophytic to right ovarian tissue. If pelvic pain persists, may consider continued evaluation to exophytic adnexal cyst or consider follow up US to reevaluate right adnexal area. -Discussed these findings and pt denies any pelvic pain or discharge, will continue to monitor.  Current Medication: Outpatient Encounter Medications as of 02/06/2021  Medication Sig   amLODipine (NORVASC) 5 MG tablet Take 1 tablet (5 mg total) by mouth daily.   bisoprolol-hydrochlorothiazide (ZIAC) 2.5-6.25 MG tablet Take 1 tablet po qd for blood pressure   DULoxetine (CYMBALTA) 30 MG capsule TAKE 1 CAPSULE BY MOUTH EVERY DAY    fluticasone furoate-vilanterol (BREO ELLIPTA) 100-25 MCG/ACT AEPB Inhale 1 puff into the lungs daily.   pantoprazole (PROTONIX) 40 MG tablet Take 1 tablet two times daily   [DISCONTINUED] rosuvastatin (CRESTOR) 5 MG tablet Take 1 tablet by mouth at night (Patient not taking: Reported on 02/06/2021)   No facility-administered encounter medications on file as of 02/06/2021.    Surgical History: Past Surgical History:  Procedure Laterality Date   ABSCESS DRAINAGE  11/17/13   buttock right   BREAST BIOPSY Right    neg   CATARACT EXTRACTION W/PHACO Right 08/17/2019   Procedure: CATARACT EXTRACTION PHACO AND INTRAOCULAR LENS PLACEMENT (IOC) RIGHT VISION BLUE 5.32  00:42.7;  Surgeon: Marchia Meiers, MD;  Location: [place];  Service: Ophthalmology;  Laterality: Right;   CATARACT EXTRACTION W/PHACO Left 09/21/2019   Procedure: CATARACT EXTRACTION PHACO AND INTRAOCULAR LENS PLACEMENT ([place]) LEFT VISION BLUE;  Surgeon: Marchia Meiers, MD;  Location: [place];  Service: Ophthalmology;  Laterality: Left;  6.24 0:40.6   CHOLECYSTECTOMY  2008   COLONOSCOPY  2014   [facility] [place] N/A 08/26/2015   Procedure: COLONOSCOPY WITH PROPOFOL;  Surgeon: Hulen Luster, MD;  Location: [facility] ENDOSCOPY;  Service: Gastroenterology;  Laterality: N/A;   ESOPHAGOGASTRODUODENOSCOPY N/A 11/15/2014   Procedure: ESOPHAGOGASTRODUODENOSCOPY (EGD);  Surgeon: Hulen Luster, MD;  Location: [facility] ENDOSCOPY;  Service: Gastroenterology;  Laterality: N/A;   UPPER GI ENDOSCOPY  2014   [facility] [place] History: Past Medical History:  Diagnosis Date  Anxiety    Arthritis    Atrophic kidney    Chronic constipation    Colon polyp 2013   COPD (chronic obstructive pulmonary disease) (HCC)    Depression    Diverticulosis    Esophageal stricture    Esophagitis    Fibrocystic breast disease    GERD (gastroesophageal reflux disease)    History of hiatal  hernia    Hypertension    IBS (irritable bowel syndrome)    Menopause    Ovarian cyst    Plantar fasciitis    Postmenopausal    Renal artery occlusion (HCC)    Renal insufficiency    Right kidney is small and not functioning.    Family History: Family History  Problem Relation Age of Onset   Heart disease Mother    Heart attack Mother    Diabetes Mother    Alzheimer's disease Father    Breast cancer Sister 71   Lung cancer Brother    Brain cancer Brother    Lung cancer Sister    Bladder Cancer Sister    Lung cancer Brother    Leukemia Brother    Diabetes Brother     Social History   Socioeconomic History   Marital status: Widowed    Spouse name: Not on file   Number of children: Not on file   Years of education: Not on file   Highest education level: Not on file  Occupational History   Not on file  Tobacco Use   Smoking status: Every Day    Packs/day: 0.50    Years: 30.00    Pack years: 15.00    Types: Cigarettes   Smokeless tobacco: Never   Tobacco comments:    since age 57, pt trying to cut back  Vaping Use   Vaping Use: Never used  Substance and Sexual Activity   Alcohol use: No   Drug use: No   Sexual activity: Not on file  Other Topics Concern   Not on file  Social History Narrative   Not on file   Social Determinants of Health   Financial Resource Strain: Low Risk    Difficulty of Paying Living Expenses: Not very hard  Food Insecurity: Not on file  Transportation Needs: Not on file  Physical Activity: Not on file  Stress: Not on file  Social Connections: Not on file  Intimate Partner Violence: Not on file      Review of Systems  Constitutional:  Negative for chills, fatigue and unexpected weight change.  HENT:  Positive for postnasal drip. Negative for congestion, rhinorrhea, sneezing and sore throat.   Eyes:  Negative for redness.  Respiratory:  Negative for cough, chest tightness, shortness of breath and wheezing.   Cardiovascular:   Negative for chest pain and palpitations.  Gastrointestinal:  Negative for abdominal pain, constipation, diarrhea, nausea and vomiting.  Genitourinary:  Negative for dysuria, frequency, pelvic pain and vaginal discharge.  Musculoskeletal:  Positive for arthralgias. Negative for back pain, joint swelling and neck pain.       Left shoulder pain and decreased ROM  Skin:  Negative for rash.  Neurological: Negative.  Negative for tremors and numbness.  Hematological:  Negative for adenopathy. Does not bruise/bleed easily.  Psychiatric/Behavioral:  Negative for behavioral problems (Depression), sleep disturbance and suicidal ideas. The patient is not nervous/anxious.    Vital Signs: BP (!) 152/73   Pulse 60   Temp 98.2 F (36.8 C)   Resp 16   Ht 5\' 8"  (  1.727 m)   Wt 182 lb (82.6 kg)   SpO2 98%   BMI 27.67 kg/m    Physical Exam Vitals and nursing note reviewed.  Constitutional:      General: She is not in acute distress.    Appearance: She is well-developed. She is not diaphoretic.  HENT:     Head: Normocephalic and atraumatic.     Mouth/Throat:     Pharynx: No oropharyngeal exudate.  Eyes:     Pupils: Pupils are equal, round, and reactive to light.  Neck:     Thyroid: No thyromegaly.     Vascular: No JVD.     Trachea: No tracheal deviation.  Cardiovascular:     Rate and Rhythm: Normal rate and regular rhythm.     Heart sounds: Normal heart sounds. No murmur heard.   No friction rub. No gallop.  Pulmonary:     Effort: Pulmonary effort is normal. No respiratory distress.     Breath sounds: No wheezing or rales.  Chest:     Chest wall: No tenderness.  Abdominal:     General: Bowel sounds are normal.     Palpations: Abdomen is soft.  Musculoskeletal:        General: Tenderness present. Normal range of motion.     Cervical back: Normal range of motion and neck supple.     Comments: Tenderness on palpation of left shoulder, with limited ROM, +hawkins  Lymphadenopathy:      Cervical: No cervical adenopathy.  Skin:    General: Skin is warm and dry.  Neurological:     Mental Status: She is alert and oriented to person, place, and time.     Cranial Nerves: No cranial nerve deficit.  Psychiatric:        Behavior: Behavior normal.        Thought Content: Thought content normal.        Judgment: Judgment normal.       Assessment/Plan: 1. Benign hypertension Increased on exam today, likely due to pain in shoulder. Will monitor closely and call office if continues to be elevated and may need to titrate medication  2. Centrilobular emphysema ([place]) Reviewed PFT consistent with mod COPD. Will try sending script for breo, may need alternative.  Further discussed smoking cessation and patient is working on cutting back - fluticasone furoate-vilanterol (BREO ELLIPTA) 100-25 MCG/ACT AEPB; Inhale 1 puff into the lungs daily.  Dispense: 1 each; Refill: 11  3. Acute pain of left shoulder Advised to try Voltaren gel as needed rather than take aleve/oral NSAIDs and ice the shoulder.  Will refer to orthopedics as patient may need injection or further diagnostics - AMB referral to orthopedics  4. Aortic atherosclerosis ([place]) Patient had held statin the past few days to see if this was because of shoulder pain.  If shoulder pain persists while holding statin, as is likely, she will restart statin  5. Cyst of right ovary Patient declines any pelvic pain or discharge at this time.  Will monitor closely and may need further follow-up especially if symptoms occur  6. Stage 3b chronic kidney disease (HCC) Stable, continue to monitor  7. Osteopenia of lumbar spine Will hold off on ordering BMD due to current shoulder pain, but this will need to be ordered at next visit   General Counseling: Truitt Leep understanding of the findings of todays visit and agrees with plan of treatment. I have discussed any further diagnostic evaluation that may be needed or ordered  today. We  also reviewed her medications today. she has been encouraged to call the office with any questions or concerns that should arise related to todays visit.    Orders Placed This Encounter  Procedures   AMB referral to orthopedics    Meds ordered this encounter  Medications   fluticasone furoate-vilanterol (BREO ELLIPTA) 100-25 MCG/ACT AEPB    Sig: Inhale 1 puff into the lungs daily.    Dispense:  1 each    Refill:  11    This patient was seen by Drema Dallas, PA-C in collaboration with Dr. Clayborn Bigness as a part of collaborative care agreement.   Total time spent:30 Minutes Time spent includes review of chart, medications, test results, and follow up plan with the patient.      Dr Lavera Guise Internal medicine

## 2021-02-07 ENCOUNTER — Telehealth: Payer: Self-pay

## 2021-02-07 NOTE — Telephone Encounter (Signed)
Orthopedic referral manually faxed to [facility] [phone]

## 2021-02-08 DIAGNOSIS — M7542 Impingement syndrome of left shoulder: Secondary | ICD-10-CM | POA: Diagnosis not present

## 2021-02-14 ENCOUNTER — Ambulatory Visit: Payer: Medicare Other | Admitting: Pharmacist

## 2021-02-14 VITALS — BP 128/75

## 2021-02-14 DIAGNOSIS — E782 Mixed hyperlipidemia: Secondary | ICD-10-CM

## 2021-02-14 DIAGNOSIS — I10 Essential (primary) hypertension: Secondary | ICD-10-CM

## 2021-02-14 NOTE — Patient Instructions (Addendum)
Visit Information   Goals Addressed   None    Patient Care Plan: General Pharmacy (Adult)     Problem Identified: HTN, COPD, GERD, GAD, HLD   Priority: High  Onset Date: 10/15/2020     Long-Range Goal: Patient-Specific Goal   Start Date: 10/15/2020  Expected End Date: 04/16/2021  Recent Progress: On track  Priority: High  Note:   Current Barriers:  Unable to achieve control of cholesterol.   Pharmacist Clinical Goal(s):  Patient will achieve control of cholesterol as evidenced by lipid panel adhere to plan to optimize therapeutic regimen for lipids as evidenced by report of adherence to recommended medication management changes contact provider office for questions/concerns as evidenced notation of same in electronic health record through collaboration with PharmD and provider.   Interventions: 1:1 collaboration with Lavera Guise, MD regarding development and update of comprehensive plan of care as evidenced by provider attestation and co-signature Inter-disciplinary care team collaboration (see longitudinal plan of care) Comprehensive medication review performed; medication list updated in electronic medical record  Hypertension (BP goal <140/90) -Controlled -Current treatment: Amlodipine 2.5mg  daily Bisoprolol/HCTZ 2.5-6.25mg  daily -Medications previously tried: none noted  -Current home readings: 130s/80s -Current dietary habits: baked foods, water, lemonade with artificial sweetener, cereal -Current exercise habits: minimal besides yard work -Denies hypotensive/hypertensive symptoms -Educated on BP goals and benefits of medications for prevention of heart attack, stroke and kidney damage; Importance of home blood pressure monitoring; Symptoms of hypotension and importance of maintaining adequate hydration; -Counseled to monitor BP at home a few times per week, document, and provide log at future appointments -Recommended to continue current medication  Update  02/14/21 128-135/75-76 - home readings Denies any symptoms Elevated reading in office due to shoulder pain. Continue to monitor at home, adjust meds as needed but no changes at this time.  Hyperlipidemia: (LDL goal < 100) -Uncontrolled -Current treatment: Rosuvastatin 5mg  daily -Medications previously tried: Lipitor (cramps)  -Current dietary patterns: see above -Current exercise habits: see above -Educated on Cholesterol goals;  Benefits of statin for ASCVD risk reduction; Importance of limiting foods high in cholesterol; -She still has not started her Crestor because she went to pick it up at the pharmacy and it was > $100 for a 90 days supply.   -Discussed alternative statins she could use, she is going to check on the price using [facility] at [facility], since she already gets another medication there using a coupon.   -Discussed the importance of taking something based on most recent LDL. -Counseled on diet and exercise extensively Recommended coupon, if that does not work will look to call in alternative statin.  Update 02/14/21 She stopped her Crestor due to some ongoing pain in her shoulder for which she received cortisone injection.  At that time plan was to remain off, if pain did not improve while she was off the statin, then she could resume. Counseled on benefits of statin medication, LDL goal.  IF pain does not improve would recommend her to start statin back as unlikely due to statin.  Has FU appt in November.  At that time could consider switching to something like Zetia 10mg  daiaily, or injectable medication such as Repatha or Leqvio if oral medication not an option. Continue current management for now, would recommend recheck lipids at next OV due to LDL at previous labs. Anxiety (Goal: Minimize symptoms) -Controlled -Current treatment: Duloxetine 30mg  daily -Medications previously tried/failed: Buspirone, escitalopram -PHQ9:  Depression screen [facility] 2/9 09/09/2020  03/04/2020 12/01/2019 10/20/2019 09/07/2019  Decreased Interest 0 0 0 1 0  Down, Depressed, Hopeless 0 0 0 0 0  PHQ - 2 Score 0 0 0 1 0  -GAD7: No flowsheet data found.  -Patient reports meed and sleep have been good lately -Duloxetine has really made a difference once she switched to this. -Educated on Benefits of medication for symptom control -Recommended to continue current medication  GERD (Goal: Minimize symptoms) -Controlled -Current treatment  Pantoprazole 40mg  twice daily -Medications previously tried: none noted -Still having sporadic episodes of stomach pain/fullness.  She is scheduled to see a GI specialist about this ongoing issue. -Takes medication appropriately  -Recommended to continue current medication  Patient Goals/Self-Care Activities Patient will:  - take medications as prescribed focus on medication adherence by pill counts check blood pressure a few times weekly, document, and provide at future appointments target a minimum of 150 minutes of moderate intensity exercise weekly  Follow Up Plan: The care management team will reach out to the patient again over the next 120 days.            Patient verbalizes understanding of instructions provided today and agrees to view in [place].  Telephone follow up appointment with pharmacy team member scheduled for: 4 months  Edythe Clarity, [place]

## 2021-02-17 ENCOUNTER — Telehealth: Payer: Self-pay | Admitting: Pharmacist

## 2021-02-17 DIAGNOSIS — I10 Essential (primary) hypertension: Secondary | ICD-10-CM | POA: Diagnosis not present

## 2021-02-17 DIAGNOSIS — J449 Chronic obstructive pulmonary disease, unspecified: Secondary | ICD-10-CM | POA: Diagnosis not present

## 2021-02-17 NOTE — Progress Notes (Signed)
ERROR

## 2021-02-17 NOTE — Progress Notes (Signed)
    Chronic Care Management Pharmacy Assistant   Name: NAUREEN BENTON  MRN: [id number] DOB: 09-07-1950  Reason for Encounter: PAP  Medications: Outpatient Encounter Medications as of 02/17/2021  Medication Sig   amLODipine (NORVASC) 5 MG tablet Take 1 tablet (5 mg total) by mouth daily.   bisoprolol-hydrochlorothiazide (ZIAC) 2.5-6.25 MG tablet Take 1 tablet po qd for blood pressure   DULoxetine (CYMBALTA) 30 MG capsule TAKE 1 CAPSULE BY MOUTH EVERY DAY   fluticasone furoate-vilanterol (BREO ELLIPTA) 100-25 MCG/ACT AEPB Inhale 1 puff into the lungs daily.   pantoprazole (PROTONIX) 40 MG tablet Take 1 tablet two times daily   No facility-administered encounter medications on file as of 02/17/2021.   New patient assistance application form filled out to [place] for [employer].. Waiting for patient and provider to complete and sign documentation. Called patient to inquire if they wanted the application mailed to them or if they wanted to come into the office. Patient is required to sign application and to bring/have proof of income. She stated she would be willing to come into office to bring proof of income and sign application once all the paperwork came in the mail she would like it mailed to their residence address [address]  [place] [address]  [place], [place] Pharmacist Assistant [phone]

## 2021-02-28 ENCOUNTER — Encounter: Payer: Self-pay | Admitting: *Deleted

## 2021-03-03 ENCOUNTER — Encounter: Payer: Self-pay | Admitting: *Deleted

## 2021-03-03 ENCOUNTER — Ambulatory Visit: Payer: Medicare Other | Admitting: Anesthesiology

## 2021-03-03 ENCOUNTER — Encounter
Admission: RE | Disposition: A | Discharge status: Home / Self Care | Payer: Self-pay | Source: Ambulatory Visit | Attending: Gastroenterology

## 2021-03-03 ENCOUNTER — Ambulatory Visit
Admission: RE | Admit: 2021-03-03 | Discharge: 2021-03-03 | Disposition: A | Discharge status: Home / Self Care | Payer: Medicare Other | Source: Ambulatory Visit | Attending: Gastroenterology | Admitting: Gastroenterology

## 2021-03-03 DIAGNOSIS — K2950 Unspecified chronic gastritis without bleeding: Secondary | ICD-10-CM | POA: Insufficient documentation

## 2021-03-03 DIAGNOSIS — Z6827 Body mass index (BMI) 27.0-27.9, adult: Secondary | ICD-10-CM | POA: Diagnosis not present

## 2021-03-03 DIAGNOSIS — K2970 Gastritis, unspecified, without bleeding: Secondary | ICD-10-CM | POA: Diagnosis not present

## 2021-03-03 DIAGNOSIS — K449 Diaphragmatic hernia without obstruction or gangrene: Secondary | ICD-10-CM | POA: Diagnosis not present

## 2021-03-03 DIAGNOSIS — R634 Abnormal weight loss: Secondary | ICD-10-CM | POA: Diagnosis not present

## 2021-03-03 DIAGNOSIS — K2100 Gastro-esophageal reflux disease with esophagitis, without bleeding: Secondary | ICD-10-CM | POA: Diagnosis not present

## 2021-03-03 DIAGNOSIS — K222 Esophageal obstruction: Secondary | ICD-10-CM | POA: Diagnosis not present

## 2021-03-03 DIAGNOSIS — F419 Anxiety disorder, unspecified: Secondary | ICD-10-CM | POA: Diagnosis not present

## 2021-03-03 DIAGNOSIS — K529 Noninfective gastroenteritis and colitis, unspecified: Secondary | ICD-10-CM | POA: Diagnosis present

## 2021-03-03 DIAGNOSIS — Z9049 Acquired absence of other specified parts of digestive tract: Secondary | ICD-10-CM | POA: Insufficient documentation

## 2021-03-03 DIAGNOSIS — K5730 Diverticulosis of large intestine without perforation or abscess without bleeding: Secondary | ICD-10-CM | POA: Insufficient documentation

## 2021-03-03 DIAGNOSIS — R6881 Early satiety: Secondary | ICD-10-CM | POA: Diagnosis not present

## 2021-03-03 DIAGNOSIS — F17210 Nicotine dependence, cigarettes, uncomplicated: Secondary | ICD-10-CM | POA: Diagnosis not present

## 2021-03-03 DIAGNOSIS — R1013 Epigastric pain: Secondary | ICD-10-CM | POA: Diagnosis not present

## 2021-03-03 DIAGNOSIS — M1990 Unspecified osteoarthritis, unspecified site: Secondary | ICD-10-CM | POA: Insufficient documentation

## 2021-03-03 DIAGNOSIS — R197 Diarrhea, unspecified: Secondary | ICD-10-CM | POA: Diagnosis not present

## 2021-03-03 DIAGNOSIS — K634 Enteroptosis: Secondary | ICD-10-CM | POA: Diagnosis not present

## 2021-03-03 DIAGNOSIS — K2289 Other specified disease of esophagus: Secondary | ICD-10-CM | POA: Diagnosis not present

## 2021-03-03 HISTORY — DX: Rectal abscess: K61.1

## 2021-03-03 HISTORY — PX: COLONOSCOPY WITH PROPOFOL: SHX5780

## 2021-03-03 HISTORY — PX: ESOPHAGOGASTRODUODENOSCOPY (EGD) WITH PROPOFOL: SHX5813

## 2021-03-03 SURGERY — ESOPHAGOGASTRODUODENOSCOPY (EGD) WITH PROPOFOL
Anesthesia: General

## 2021-03-03 MED ORDER — SODIUM CHLORIDE 0.9 % IV SOLN: INTRAVENOUS | Status: DC

## 2021-03-03 MED ORDER — MIDAZOLAM HCL 2 MG/2ML IJ SOLN
INTRAMUSCULAR | Status: AC
  Filled 2021-03-03: qty 2

## 2021-03-03 MED ORDER — PROPOFOL 500 MG/50ML IV EMUL
INTRAVENOUS | Status: AC
  Filled 2021-03-03: qty 50

## 2021-03-03 MED ORDER — LIDOCAINE HCL (PF) 2 % IJ SOLN
INTRAMUSCULAR | Status: AC
  Filled 2021-03-03: qty 5

## 2021-03-03 MED ORDER — PROPOFOL 10 MG/ML IV BOLUS
INTRAVENOUS | Status: DC | PRN
  Administered 2021-03-03: 10 mg via INTRAVENOUS
  Administered 2021-03-03: 40 mg via INTRAVENOUS

## 2021-03-03 MED ORDER — FENTANYL CITRATE (PF) 100 MCG/2ML IJ SOLN
INTRAMUSCULAR | Status: DC | PRN
  Administered 2021-03-03 (×4): 25 ug via INTRAVENOUS

## 2021-03-03 MED ORDER — STERILE WATER FOR IRRIGATION IR SOLN
Status: DC | PRN
  Administered 2021-03-03: 5 mL

## 2021-03-03 MED ORDER — EPHEDRINE SULFATE 50 MG/ML IJ SOLN
INTRAMUSCULAR | Status: DC | PRN
  Administered 2021-03-03: 10 mg via INTRAVENOUS
  Administered 2021-03-03: 5 mg via INTRAVENOUS

## 2021-03-03 MED ORDER — PROPOFOL 500 MG/50ML IV EMUL
INTRAVENOUS | Status: DC | PRN
  Administered 2021-03-03: 50 ug/kg/min via INTRAVENOUS

## 2021-03-03 MED ORDER — MIDAZOLAM HCL 2 MG/2ML IJ SOLN
INTRAMUSCULAR | Status: DC | PRN
  Administered 2021-03-03: 2 mg via INTRAVENOUS

## 2021-03-03 MED ORDER — FENTANYL CITRATE (PF) 100 MCG/2ML IJ SOLN
INTRAMUSCULAR | Status: AC
  Filled 2021-03-03: qty 2

## 2021-03-03 MED ORDER — LIDOCAINE HCL (CARDIAC) PF 100 MG/5ML IV SOSY
PREFILLED_SYRINGE | INTRAVENOUS | Status: DC | PRN
  Administered 2021-03-03: 50 mg via INTRAVENOUS

## 2021-03-03 NOTE — Transfer of Care (Signed)
Immediate Anesthesia Transfer of Care Note  Patient: Shannon Li  Procedure(s) Performed: ESOPHAGOGASTRODUODENOSCOPY (EGD) WITH PROPOFOL COLONOSCOPY WITH PROPOFOL  Patient Location: PACU  Anesthesia Type:General  Level of Consciousness: sedated  Airway & Oxygen Therapy: Patient Spontanous Breathing and Patient connected to nasal cannula oxygen  Post-op Assessment: Report given to RN and Post -op Vital signs reviewed and stable  Post vital signs: Reviewed and stable  Last Vitals:  Vitals Value Taken Time  BP 105/48 03/03/21 1014  Temp    Pulse 76 03/03/21 1014  Resp 18 03/03/21 1014  SpO2 99 % 03/03/21 1014  Vitals shown include unvalidated device data.  Last Pain:  Vitals:   03/03/21 1014  TempSrc:   PainSc: 0-No pain         Complications: No notable events documented.

## 2021-03-03 NOTE — Interval H&P Note (Signed)
History and Physical Interval Note:  03/03/2021 9:38 AM  Shannon Li  has presented today for surgery, with the diagnosis of GERD k21.9, weight loss r63.4, early satiety r68.81, diarrhea r19.7.  The various methods of treatment have been discussed with the patient and family. After consideration of risks, benefits and other options for treatment, the patient has consented to  Procedure(s): ESOPHAGOGASTRODUODENOSCOPY (EGD) WITH PROPOFOL (N/A) COLONOSCOPY WITH PROPOFOL (N/A) as a surgical intervention.  The patient's history has been reviewed, patient examined, no change in status, stable for surgery.  I have reviewed the patient's chart and labs.  Questions were answered to the patient's satisfaction.     Lesly Rubenstein  Ok to proceed with EGD/Colonoscopy

## 2021-03-03 NOTE — H&P (Signed)
Outpatient short stay form Pre-procedure 03/03/2021  Lesly Rubenstein, MD  Primary Physician: Lavera Guise, MD  Reason for visit:  Dyspepsia/GERD/Weight loss/Diarrhea  History of present illness:   70 y/o lady with chronic GI issues here for worsening dyspeptic symptoms and occasional diarrhea. Symptoms are intermittent but are more severe recently. She smokes 1 ppd. No marijuana use. No blood thinners. No dysphagia. History of cholecystectomy.    Current Facility-Administered Medications:    0.9 %  sodium chloride infusion, , Intravenous, Continuous, Liah Morr, Hilton Cork, MD, Last Rate: 20 mL/hr at 03/03/21 0909, New Bag at 03/03/21 0909  Medications Prior to Admission  Medication Sig Dispense Refill Last Dose   albuterol (VENTOLIN HFA) 108 (90 Base) MCG/ACT inhaler Inhale into the lungs every 6 (six) hours as needed for wheezing or shortness of breath.   03/02/2021   amLODipine (NORVASC) 5 MG tablet Take 1 tablet (5 mg total) by mouth daily. 90 tablet 1 03/03/2021   bisoprolol-hydrochlorothiazide (ZIAC) 2.5-6.25 MG tablet Take 1 tablet po qd for blood pressure 90 tablet 1 03/02/2021   DULoxetine (CYMBALTA) 30 MG capsule TAKE 1 CAPSULE BY MOUTH EVERY DAY 90 capsule 3 03/02/2021   escitalopram (LEXAPRO) 20 MG tablet Take 20 mg by mouth daily.      famotidine (PEPCID) 40 MG tablet Take 40 mg by mouth daily.   Past Week   fluticasone furoate-vilanterol (BREO ELLIPTA) 100-25 MCG/ACT AEPB Inhale 1 puff into the lungs daily. 1 each 11 Past Week   pravastatin (PRAVACHOL) 20 MG tablet Take 20 mg by mouth daily.      ranitidine (ZANTAC) 150 MG capsule Take 150 mg by mouth 2 (two) times daily.      rosuvastatin (CRESTOR) 5 MG tablet Take 5 mg by mouth daily.   03/02/2021   eszopiclone (LUNESTA) 2 MG TABS tablet Take 2 mg by mouth at bedtime as needed for sleep. Take immediately before bedtime (Patient not taking: Reported on 03/03/2021)   Not Taking   HYDROcodone-acetaminophen (NORCO/VICODIN)  5-325 MG tablet Take 1 tablet by mouth every 6 (six) hours as needed for moderate pain. (Patient not taking: Reported on 03/03/2021)   Not Taking   pantoprazole (PROTONIX) 40 MG tablet Take 1 tablet two times daily 180 tablet 1      Allergies  Allergen Reactions   Bupropion Nausea And Vomiting   Penicillin V Potassium Other (See Comments)   Penicillins Hives     Past Medical History:  Diagnosis Date   Anxiety    Arthritis    Atrophic kidney    Chronic constipation    Colon polyp 2013   COPD (chronic obstructive pulmonary disease) (HCC)    Depression    Diverticulosis    Esophageal stricture    Esophagitis    Fibrocystic breast disease    GERD (gastroesophageal reflux disease)    History of hiatal hernia    Hypertension    IBS (irritable bowel syndrome)    Menopause    Ovarian cyst    Perirectal abscess    Plantar fasciitis    Postmenopausal    Renal artery occlusion (HCC)    Renal insufficiency    Right kidney is small and not functioning.    Review of systems:  Otherwise negative.    Physical Exam  Gen: Alert, oriented. Appears stated age.  HEENT: PERRLA. Lungs: No respiratory distress CV: RRR Abd: soft, benign, no masses Ext: No edema    Planned procedures: Proceed with EGD/colonoscopy. The patient understands the nature  of the planned procedure, indications, risks, alternatives and potential complications including but not limited to bleeding, infection, perforation, damage to internal organs and possible oversedation/side effects from anesthesia. The patient agrees and gives consent to proceed.  Please refer to procedure notes for findings, recommendations and patient disposition/instructions.     Lesly Rubenstein, MD [facility]

## 2021-03-03 NOTE — Op Note (Signed)
[facility]astroenterology Patient Name: Shannon Li Procedure Date: 03/03/2021 9:33 AM MRN: [id number] Account #: [id number] Date of Birth: 08-04-1950 Admit Type: Outpatient Age: 70 Room: [facility] ENDO ROOM 3 Gender: Female Note Status: Finalized Instrument Name: Upper Endoscope [id number] Procedure:             Upper GI endoscopy Indications:           Gastro-esophageal reflux disease, Early satiety,                         Weight loss Providers:             Andrey Farmer MD, MD Referring MD:          Lavera Guise, MD (Referring MD) Medicines:             Monitored Anesthesia Care Complications:         No immediate complications. Estimated blood loss:                         Minimal. Procedure:             Pre-Anesthesia Assessment:                        - Prior to the procedure, a History and Physical was                         performed, and patient medications and allergies were                         reviewed. The patient is competent. The risks and                         benefits of the procedure and the sedation options and                         risks were discussed with the patient. All questions                         were answered and informed consent was obtained.                         Patient identification and proposed procedure were                         verified by the physician, the nurse, the anesthetist                         and the technician in the endoscopy suite. Mental                         Status Examination: alert and oriented. Airway                         Examination: normal oropharyngeal airway and neck                         mobility. Respiratory Examination: clear to  auscultation. CV Examination: normal. Prophylactic                         Antibiotics: The patient does not require prophylactic                         antibiotics. Prior Anticoagulants: The patient has                          taken no previous anticoagulant or antiplatelet                         agents. ASA Grade Assessment: III - A patient with                         severe systemic disease. After reviewing the risks and                         benefits, the patient was deemed in satisfactory                         condition to undergo the procedure. The anesthesia                         plan was to use monitored anesthesia care (MAC).                         Immediately prior to administration of medications,                         the patient was re-assessed for adequacy to receive                         sedatives. The heart rate, respiratory rate, oxygen                         saturations, blood pressure, adequacy of pulmonary                         ventilation, and response to care were monitored                         throughout the procedure. The physical status of the                         patient was re-assessed after the procedure.                        After obtaining informed consent, the endoscope was                         passed under direct vision. Throughout the procedure,                         the patient's blood pressure, pulse, and oxygen                         saturations were monitored continuously. The Endoscope  was introduced through the mouth, and advanced to the                         second part of duodenum. The upper GI endoscopy was                         accomplished without difficulty. The patient tolerated                         the procedure well. Findings:      A hypertonic lower esophageal sphincter was found.      A widely patent Schatzki ring was found in the lower third of the       esophagus.      A 3 cm hiatal hernia was present.      The exam of the esophagus was otherwise normal.      Patchy minimal inflammation characterized by erythema was found in the       gastric antrum. Biopsies were taken with a cold forceps for  histology.       Estimated blood loss was minimal.      The examined duodenum was normal. Impression:            - Hypertonic lower esophageal sphincter.                        - Widely patent Schatzki ring.                        - 3 cm hiatal hernia.                        - Gastritis. Biopsied.                        - Normal examined duodenum. Recommendation:        - Discharge patient to home.                        - Resume previous diet.                        - Continue present medications.                        - Await pathology results.                        - Return to referring physician as previously                         scheduled. Procedure Code(s):     --- Professional ---                        [phone], Esophagogastroduodenoscopy, flexible,                         transoral; with biopsy, single or multiple Diagnosis Code(s):     --- Professional ---                        K22.8, Other specified diseases of esophagus  K22.2, Esophageal obstruction                        K44.9, Diaphragmatic hernia without obstruction or                         gangrene                        K29.70, Gastritis, unspecified, without bleeding                        K21.9, Gastro-esophageal reflux disease without                         esophagitis                        R68.81, Early satiety                        R63.4, Abnormal weight loss CPT copyright 2019 American [employer]. All rights reserved. The codes documented in this report are preliminary and upon coder review may  be revised to meet current compliance requirements. Andrey Farmer MD, MD 03/03/2021 10:33:35 AM Number of Addenda: 0 Note Initiated On: 03/03/2021 9:33 AM Estimated Blood Loss:  Estimated blood loss was minimal.      [facility]

## 2021-03-03 NOTE — Anesthesia Postprocedure Evaluation (Signed)
Anesthesia Post Note  Patient: Shannon Li  Procedure(s) Performed: ESOPHAGOGASTRODUODENOSCOPY (EGD) WITH PROPOFOL COLONOSCOPY WITH PROPOFOL  Patient location during evaluation: Endoscopy Anesthesia Type: General Level of consciousness: awake and alert Pain management: pain level controlled Vital Signs Assessment: post-procedure vital signs reviewed and stable Respiratory status: spontaneous breathing, nonlabored ventilation, respiratory function stable and patient connected to nasal cannula oxygen Cardiovascular status: blood pressure returned to baseline and stable Postop Assessment: no apparent nausea or vomiting Anesthetic complications: no   No notable events documented.   Last Vitals:  Vitals:   03/03/21 1024 03/03/21 1034  BP: (!) 113/59 128/60  Pulse: 77 62  Resp: 17 18  Temp:    SpO2: 98% 97%    Last Pain:  Vitals:   03/03/21 1034  TempSrc:   PainSc: 0-No pain                 Precious Haws Renita Brocks

## 2021-03-03 NOTE — Op Note (Signed)
[facility] Gastroenterology Patient Name: Shannon Li Procedure Date: 03/03/2021 9:33 AM MRN: [id number] Account #: [id number] Date of Birth: 03-Jul-1950 Admit Type: Outpatient Age: 70 Room: [facility] ENDO ROOM 3 Gender: Female Note Status: Finalized Instrument Name: Jasper Riling [id number] Procedure:             Colonoscopy Indications:           Chronic diarrhea, Weight loss Providers:             Andrey Farmer MD, MD Referring MD:          Lavera Guise, MD (Referring MD) Medicines:             Monitored Anesthesia Care Complications:         No immediate complications. Estimated blood loss:                         Minimal. Procedure:             Pre-Anesthesia Assessment:                        - Prior to the procedure, a History and Physical was                         performed, and patient medications and allergies were                         reviewed. The patient is competent. The risks and                         benefits of the procedure and the sedation options and                         risks were discussed with the patient. All questions                         were answered and informed consent was obtained.                         Patient identification and proposed procedure were                         verified by the physician, the nurse, the anesthetist                         and the technician in the endoscopy suite. Mental                         Status Examination: alert and oriented. Airway                         Examination: normal oropharyngeal airway and neck                         mobility. Respiratory Examination: clear to                         auscultation. CV Examination: normal. Prophylactic  Antibiotics: The patient does not require prophylactic                         antibiotics. Prior Anticoagulants: The patient has                         taken no previous anticoagulant or antiplatelet                          agents. ASA Grade Assessment: III - A patient with                         severe systemic disease. After reviewing the risks and                         benefits, the patient was deemed in satisfactory                         condition to undergo the procedure. The anesthesia                         plan was to use monitored anesthesia care (MAC).                         Immediately prior to administration of medications,                         the patient was re-assessed for adequacy to receive                         sedatives. The heart rate, respiratory rate, oxygen                         saturations, blood pressure, adequacy of pulmonary                         ventilation, and response to care were monitored                         throughout the procedure. The physical status of the                         patient was re-assessed after the procedure.                        After obtaining informed consent, the colonoscope was                         passed under direct vision. Throughout the procedure,                         the patient's blood pressure, pulse, and oxygen                         saturations were monitored continuously. The                         Colonoscope was introduced through the anus and  advanced to the the ileocecal valve. The colonoscopy                         was somewhat difficult due to poor bowel prep. The                         patient tolerated the procedure well. The quality of                         the bowel preparation was poor. Findings:      The perianal and digital rectal examinations were normal.      Normal mucosa was found in the entire colon. Biopsies for histology were       taken with a cold forceps from the entire colon for evaluation of       microscopic colitis. Estimated blood loss was minimal.      Due to poor prep in ascending colon, TI intubation was not attempted.      Multiple small-mouthed  diverticula were found in the sigmoid colon.      The exam was otherwise without abnormality on direct and retroflexion       views. Impression:            - Preparation of the colon was poor.                        - Normal mucosa in the entire examined colon. Biopsied.                        - Diverticulosis in the sigmoid colon.                        - The examination was otherwise normal on direct and                         retroflexion views. Recommendation:        - Discharge patient to home.                        - Resume previous diet.                        - Continue present medications.                        - Await pathology results.                        - Return to referring physician as previously                         scheduled. Consider CTA to evaluate for mesenteric                         ischemia and Fecal calprotectin to assess for any                         small bowel inflammation. Procedure Code(s):     --- Professional ---  45380, Colonoscopy, flexible; with biopsy, single or                         multiple Diagnosis Code(s):     --- Professional ---                        K52.9, Noninfective gastroenteritis and colitis,                         unspecified                        R63.4, Abnormal weight loss                        K57.30, Diverticulosis of large intestine without                         perforation or abscess without bleeding CPT copyright 2019 American Medical Association. All rights reserved. The codes documented in this report are preliminary and upon coder review may  be revised to meet current compliance requirements. Andrey Farmer MD, MD 03/03/2021 10:37:27 AM Number of Addenda: 0 Note Initiated On: 03/03/2021 9:33 AM Scope Withdrawal Time: 0 hours 5 minutes 50 seconds  Total Procedure Duration: 0 hours 15 minutes 33 seconds  Estimated Blood Loss:  Estimated blood loss was minimal.      [facility]

## 2021-03-03 NOTE — Anesthesia Preprocedure Evaluation (Signed)
Anesthesia Evaluation  Patient identified by MRN, date of birth, ID band Patient awake    Reviewed: Allergy & Precautions, H&P , NPO status , Patient's Chart, lab work & pertinent test results  History of Anesthesia Complications Negative for: history of anesthetic complications  Airway Mallampati: III  TM Distance: <3 FB Neck ROM: limited  Mouth opening: Limited Mouth Opening  Dental  (+) Poor Dentition, Chipped, Missing   Pulmonary neg shortness of breath, COPD, Current Smoker and Patient abstained from smoking.,    Pulmonary exam normal        Cardiovascular Exercise Tolerance: Good hypertension, (-) angina+ Peripheral Vascular Disease  (-) Past MI and (-) DOE Normal cardiovascular exam     Neuro/Psych PSYCHIATRIC DISORDERS Anxiety Depression negative neurological ROS     GI/Hepatic Neg liver ROS, hiatal hernia, GERD  Controlled,  Endo/Other  negative endocrine ROS  Renal/GU Renal disease  negative genitourinary   Musculoskeletal  (+) Arthritis ,   Abdominal   Peds  Hematology negative hematology ROS (+)   Anesthesia Other Findings Past Medical History:   Ovarian cyst                                                 Menopause                                                    Plantar fasciitis                                            Renal artery occlusion (HCC)                                 Hypertension                                                 IBS (irritable bowel syndrome)                               Colon polyp                                     2013         Renal insufficiency                                            Comment:Right kidney is small and not functioning.   Anxiety  Arthritis                                                    Atrophic kidney                                              Esophageal stricture                                          Chronic constipation                                         Depression                                                   COPD (chronic obstructive pulmonary disease) (*              Diverticulosis                                               Esophagitis                                                  History of hiatal hernia                                     Fibrocystic breast disease                                   GERD (gastroesophageal reflux disease)                       Postmenopausal                                              Past Surgical History:   CHOLECYSTECTOMY                                  2008         ABSCESS DRAINAGE                                 11/17/13        Comment:buttock right   COLONOSCOPY  2014           Comment:[facility] in [place]                               2014           Comment:[facility] in [place]   ESOPHAGOGASTRODUODENOSCOPY                      N/A 11/15/2014      Comment:Procedure: ESOPHAGOGASTRODUODENOSCOPY (EGD);                Surgeon: Hulen Luster, MD;  Location: [facility]               ENDOSCOPY;  Service: Gastroenterology;                Laterality: N/A;   BREAST BIOPSY                                   Right                Comment:neg  BMI    Body Mass Index   29.65 kg/m 2      Reproductive/Obstetrics negative OB ROS                             Anesthesia Physical  Anesthesia Plan  ASA: III  Anesthesia Plan: General   Post-op Pain Management:    Induction: Intravenous  PONV Risk Score and Plan: Propofol infusion and TIVA  Airway Management Planned: Natural Airway and Nasal Cannula  Additional Equipment:   Intra-op Plan:   Post-operative Plan:   Informed Consent: I have reviewed the patients History and Physical, chart, labs and discussed the procedure including the risks, benefits and alternatives  for the proposed anesthesia with the patient or authorized representative who has indicated his/her understanding and acceptance.     Dental Advisory Given  Plan Discussed with: Anesthesiologist, CRNA and Surgeon  Anesthesia Plan Comments: (Patient consented for risks of anesthesia including but not limited to:  - adverse reactions to medications - risk of airway placement if required - damage to eyes, teeth, lips or other oral mucosa - nerve damage due to positioning  - sore throat or hoarseness - Damage to heart, brain, nerves, lungs, other parts of body or loss of life  Patient voiced understanding.)        Anesthesia Quick Evaluation

## 2021-03-04 ENCOUNTER — Encounter: Payer: Self-pay | Admitting: Gastroenterology

## 2021-03-04 ENCOUNTER — Other Ambulatory Visit: Payer: Self-pay | Admitting: Internal Medicine

## 2021-03-04 DIAGNOSIS — K219 Gastro-esophageal reflux disease without esophagitis: Secondary | ICD-10-CM

## 2021-03-04 DIAGNOSIS — I10 Essential (primary) hypertension: Secondary | ICD-10-CM

## 2021-03-04 LAB — SURGICAL PATHOLOGY

## 2021-03-07 ENCOUNTER — Other Ambulatory Visit: Payer: Self-pay | Admitting: Physician Assistant

## 2021-03-07 ENCOUNTER — Other Ambulatory Visit: Payer: Self-pay | Admitting: Internal Medicine

## 2021-03-07 ENCOUNTER — Telehealth: Payer: Self-pay

## 2021-03-07 DIAGNOSIS — I10 Essential (primary) hypertension: Secondary | ICD-10-CM

## 2021-03-07 DIAGNOSIS — J0100 Acute maxillary sinusitis, unspecified: Secondary | ICD-10-CM

## 2021-03-07 MED ORDER — SULFAMETHOXAZOLE-TRIMETHOPRIM 800-160 MG PO TABS: 1.0000 | ORAL_TABLET | Freq: Two times a day (BID) | ORAL | 0 refills | Status: DC

## 2021-03-10 NOTE — Telephone Encounter (Signed)
Spoke to pt, she picked up meds on Friday and is slowly starting to feel better. She will finish meds.

## 2021-03-14 ENCOUNTER — Ambulatory Visit: Payer: Medicare Other | Admitting: Physician Assistant

## 2021-03-17 ENCOUNTER — Ambulatory Visit (INDEPENDENT_AMBULATORY_CARE_PROVIDER_SITE_OTHER): Payer: Medicare Other | Admitting: Physician Assistant

## 2021-03-17 ENCOUNTER — Encounter: Payer: Self-pay | Admitting: Physician Assistant

## 2021-03-17 ENCOUNTER — Other Ambulatory Visit: Payer: Self-pay

## 2021-03-17 DIAGNOSIS — M79632 Pain in left forearm: Secondary | ICD-10-CM | POA: Diagnosis not present

## 2021-03-17 DIAGNOSIS — I10 Essential (primary) hypertension: Secondary | ICD-10-CM | POA: Diagnosis not present

## 2021-03-17 DIAGNOSIS — E2839 Other primary ovarian failure: Secondary | ICD-10-CM | POA: Diagnosis not present

## 2021-03-17 DIAGNOSIS — M7989 Other specified soft tissue disorders: Secondary | ICD-10-CM

## 2021-03-17 DIAGNOSIS — K219 Gastro-esophageal reflux disease without esophagitis: Secondary | ICD-10-CM

## 2021-03-17 DIAGNOSIS — J432 Centrilobular emphysema: Secondary | ICD-10-CM | POA: Diagnosis not present

## 2021-03-17 NOTE — Progress Notes (Signed)
[facility] [place], [place] [address]  Internal MEDICINE  Office Visit Note  Patient Name: Shannon Li  [id number]  [id number]  Date of Service: 03/17/2021  Chief Complaint  Patient presents with   Follow-up   Depression   Hypertension   Hyperlipidemia   COPD    HPI Pt is here for routine follow up -Had endoscopy and colonoscopy--does not have results and has not heard from their office still. Taking pepcid and protonix, but she gets nauseous and has been losing weight so needs to follow up on this. She will contact their office if she has not heard anything by end of week. -no appetite because of the nausea, will try boost or ensure shakes to keep protein intake up -cortisone shot in left arm with ortho has helped -some swelling and tenderness along left forearm from IV placement from procedure 2 weeks ago. States swelling has improved some, but still tender. Will order [place] to investigate further -Due for BMD and will order this now -Does report her sister passed away recently, but is doing ok knowing she isnt suffering anymore -Sinuses improving since ABX  Current Medication: Outpatient Encounter Medications as of 03/17/2021  Medication Sig   amLODipine (NORVASC) 5 MG tablet Take 1 tablet (5 mg total) by mouth daily.   bisoprolol-hydrochlorothiazide (ZIAC) 2.5-6.25 MG tablet TAKE 1 TABLET BY MOUTH EVERY DAY FOR BLOOD PRESSURE   DULoxetine (CYMBALTA) 30 MG capsule TAKE 1 CAPSULE BY MOUTH EVERY DAY   famotidine (PEPCID) 40 MG tablet Take 40 mg by mouth daily.   fluticasone furoate-vilanterol (BREO ELLIPTA) 100-25 MCG/ACT AEPB Inhale 1 puff into the lungs daily.   pantoprazole (PROTONIX) 40 MG tablet TAKE 1 TABLET BY MOUTH TWICE A DAY   sulfamethoxazole-trimethoprim (BACTRIM DS) 800-160 MG tablet Take 1 tablet by mouth 2 (two) times daily. (Patient not taking: Reported on 03/17/2021)   [DISCONTINUED] albuterol (VENTOLIN HFA) 108 (90 Base) MCG/ACT  inhaler Inhale into the lungs every 6 (six) hours as needed for wheezing or shortness of breath. (Patient not taking: Reported on 03/17/2021)   [DISCONTINUED] escitalopram (LEXAPRO) 20 MG tablet Take 20 mg by mouth daily.   [DISCONTINUED] eszopiclone (LUNESTA) 2 MG TABS tablet Take 2 mg by mouth at bedtime as needed for sleep. Take immediately before bedtime (Patient not taking: Reported on 03/03/2021)   [DISCONTINUED] HYDROcodone-acetaminophen (NORCO/VICODIN) 5-325 MG tablet Take 1 tablet by mouth every 6 (six) hours as needed for moderate pain. (Patient not taking: Reported on 03/03/2021)   [DISCONTINUED] pravastatin (PRAVACHOL) 20 MG tablet Take 20 mg by mouth daily. (Patient not taking: Reported on 03/17/2021)   [DISCONTINUED] ranitidine (ZANTAC) 150 MG capsule Take 150 mg by mouth 2 (two) times daily. (Patient not taking: Reported on 03/17/2021)   [DISCONTINUED] rosuvastatin (CRESTOR) 5 MG tablet Take 5 mg by mouth daily. (Patient not taking: Reported on 03/17/2021)   No facility-administered encounter medications on file as of 03/17/2021.    Surgical History: Past Surgical History:  Procedure Laterality Date   ABSCESS DRAINAGE  11/17/2013   buttock right   BREAST BIOPSY Right    neg   CATARACT EXTRACTION W/PHACO Right 08/17/2019   Procedure: CATARACT EXTRACTION PHACO AND INTRAOCULAR LENS PLACEMENT (IOC) RIGHT VISION BLUE 5.32  00:42.7;  Surgeon: Marchia Meiers, MD;  Location: [facility];  Service: Ophthalmology;  Laterality: Right;   CATARACT EXTRACTION W/PHACO Left 09/21/2019   Procedure: CATARACT EXTRACTION PHACO AND INTRAOCULAR LENS PLACEMENT ([place]) LEFT VISION BLUE;  Surgeon: Marchia Meiers,  MD;  Location: [place];  Service: Ophthalmology;  Laterality: Left;  6.24 0:40.6   CHOLECYSTECTOMY  2008   COLONOSCOPY  2014   [facility] [place] N/A 08/26/2015   Procedure: COLONOSCOPY WITH PROPOFOL;  Surgeon: Hulen Luster, MD;  Location:  [facility] ENDOSCOPY;  Service: Gastroenterology;  Laterality: N/A;   COLONOSCOPY WITH PROPOFOL N/A 03/03/2021   Procedure: COLONOSCOPY WITH PROPOFOL;  Surgeon: Lesly Rubenstein, MD;  Location: [facility] ENDOSCOPY;  Service: Endoscopy;  Laterality: N/A;   DILATION AND CURETTAGE OF UTERUS     ESOPHAGOGASTRODUODENOSCOPY N/A 11/15/2014   Procedure: ESOPHAGOGASTRODUODENOSCOPY (EGD);  Surgeon: Hulen Luster, MD;  [facility] END[facility]COPY;  Service: Gastroenterology;  Laterality: N/A;   ESOPHAGOGASTRODUODENOSCOPY (EGD) WITH PROPOFOL N/A 03/03/2021   Procedure: ESOPHAGOGASTRODUODENOSCOPY (EGD) WITH PROPOFOL;  Surgeon: Lesly Rubenstein, MD;  Location: [facility] ENDOSCOPY;  Service: Endoscopy;  Laterality: N/A;   EYE SURGERY     GASTROSTOMY     UPPER GI ENDOSCOPY  2014   [facility] [place] History: Past Medical History:  Diagnosis Date   Anxiety    Arthritis    Atrophic kidney    Chronic constipation    Colon polyp 2013   COPD (chronic obstructive pulmonary disease) (HCC)    Depression    Diverticulosis    Esophageal stricture    Esophagitis    Fibrocystic breast disease    GERD (gastroesophageal reflux disease)    History of hiatal hernia    Hypertension    IBS (irritable bowel syndrome)    Menopause    Ovarian cyst    Perirectal abscess    Plantar fasciitis    Postmenopausal    Renal artery occlusion (HCC)    Renal insufficiency    Right kidney is small and not functioning.    Family History: Family History  Problem Relation Age of Onset   Heart disease Mother    Heart attack Mother    Diabetes Mother    Alzheimer's disease Father    Breast cancer Sister 100   Lung cancer Brother    Brain cancer Brother    Lung cancer Sister    Bladder Cancer Sister    Lung cancer Brother    Leukemia Brother    Diabetes Brother     Social History   Socioeconomic History   Marital status: Widowed    Spouse name: Not on file   Number of children: Not on file   Years of  education: Not on file   Highest education level: Not on file  Occupational History   Not on file  Tobacco Use   Smoking status: Every Day    Packs/day: 0.50    Years: 30.00    Pack years: 15.00    Types: Cigarettes   Smokeless tobacco: Never   Tobacco comments:    since age 21, pt trying to cut back  Vaping Use   Vaping Use: Never used  Substance and Sexual Activity   Alcohol use: No   Drug use: No   Sexual activity: Not on file  Other Topics Concern   Not on file  Social History Narrative   Not on file   Social Determinants of Health   Financial Resource Strain: Low Risk    Difficulty of Paying Living Expenses: Not very hard  Food Insecurity: Not on file  Transportation Needs: Not on file  Physical Activity: Not on file  Stress: Not on file  Social  Connections: Not on file  Intimate Partner Violence: Not on file      Review of Systems  Constitutional:  Positive for appetite change. Negative for chills, fatigue and unexpected weight change.  HENT:  Negative for congestion, postnasal drip, rhinorrhea, sneezing and sore throat.   Eyes:  Negative for redness.  Respiratory:  Negative for cough, chest tightness and shortness of breath.   Cardiovascular:  Negative for chest pain and palpitations.  Gastrointestinal:  Positive for nausea. Negative for abdominal pain, constipation, diarrhea and vomiting.  Genitourinary:  Negative for dysuria and frequency.  Musculoskeletal:  Positive for myalgias. Negative for arthralgias, back pain, joint swelling and neck pain.       Pain and swelling along left forearm since IV  Skin:  Negative for rash.  Neurological: Negative.  Negative for tremors and numbness.  Hematological:  Negative for adenopathy. Does not bruise/bleed easily.  Psychiatric/Behavioral:  Negative for behavioral problems (Depression), sleep disturbance and suicidal ideas. The patient is not nervous/anxious.    Vital Signs: BP (!) 142/82   Pulse 89   Temp 97.8  F (36.6 C)   Resp 16   Ht 5\' 8"  (1.727 m)   Wt 169 lb (76.7 kg)   SpO2 96%   BMI 25.70 kg/m    Physical Exam Vitals and nursing note reviewed.  Constitutional:      General: She is not in acute distress.    Appearance: She is well-developed and normal weight. She is not diaphoretic.  HENT:     Head: Normocephalic and atraumatic.     Mouth/Throat:     Pharynx: No oropharyngeal exudate.  Eyes:     Pupils: Pupils are equal, round, and reactive to light.  Neck:     Thyroid: No thyromegaly.     Vascular: No JVD.     Trachea: No tracheal deviation.  Cardiovascular:     Rate and Rhythm: Normal rate and regular rhythm.     Heart sounds: Normal heart sounds. No murmur heard.   No friction rub. No gallop.  Pulmonary:     Effort: Pulmonary effort is normal. No respiratory distress.     Breath sounds: No wheezing or rales.  Chest:     Chest wall: No tenderness.  Abdominal:     General: Bowel sounds are normal.     Palpations: Abdomen is soft.  Musculoskeletal:        General: Tenderness present. Normal range of motion.     Cervical back: Normal range of motion and neck supple.     Comments: Tenderness to palpation from cubital fossa down forearm  Lymphadenopathy:     Cervical: No cervical adenopathy.  Skin:    General: Skin is warm and dry.  Neurological:     Mental Status: She is alert and oriented to person, place, and time.     Cranial Nerves: No cranial nerve deficit.  Psychiatric:        Behavior: Behavior normal.        Thought Content: Thought content normal.        Judgment: Judgment normal.       Assessment/Plan: 1. Essential hypertension Slightly elevated, continue to monitor and continue current medications  2. Pain and swelling of forearm, left Will order [place] to evaluate further - VAS [place] UPPER EXTREMITY VENOUS DUPLEX; Future  3. Primary ovarian failure - DG Bone Density; Future  4. Centrilobular emphysema (HCC) Continue inhaler as prescribed  5.  Gastroesophageal reflux disease without esophagitis Follow up with  GI for test results and further management and continue PPI, advised to try protein shakes to help with protein intake despite nausea   General Counseling: harli engelken understanding of the findings of todays visit and agrees with plan of treatment. I have discussed any further diagnostic evaluation that may be needed or ordered today. We also reviewed her medications today. she has been encouraged to call the office with any questions or concerns that should arise related to todays visit.    Orders Placed This Encounter  Procedures   DG Bone Density   VAS [place] UPPER EXTREMITY VENOUS DUPLEX    No orders of the defined types were placed in this encounter.   This patient was seen by Drema Dallas, PA-C in collaboration with Dr. Clayborn Bigness as a part of collaborative care agreement.   Total time spent:30 Minutes Time spent includes review of chart, medications, test results, and follow up plan with the patient.      Dr Lavera Guise Internal medicine

## 2021-03-18 ENCOUNTER — Other Ambulatory Visit: Payer: Self-pay | Admitting: Gastroenterology

## 2021-03-18 DIAGNOSIS — R1114 Bilious vomiting: Secondary | ICD-10-CM

## 2021-03-19 ENCOUNTER — Ambulatory Visit: Payer: Medicare Other

## 2021-03-19 ENCOUNTER — Other Ambulatory Visit: Payer: Self-pay

## 2021-03-19 DIAGNOSIS — M7989 Other specified soft tissue disorders: Secondary | ICD-10-CM

## 2021-03-19 DIAGNOSIS — M79632 Pain in left forearm: Secondary | ICD-10-CM

## 2021-03-21 ENCOUNTER — Telehealth: Payer: Self-pay | Admitting: Physician Assistant

## 2021-03-21 DIAGNOSIS — I82612 Acute embolism and thrombosis of superficial veins of left upper extremity: Secondary | ICD-10-CM

## 2021-03-21 NOTE — Telephone Encounter (Signed)
Patient called to discuss [place] results. Reviewed results showing SVT of cephalic vein of left upper extremity. Advised to take 325mg  ASA and use warm compresses. Patient advised to monitor symptoms and states the swelling has been improving since IV procedure a few weeks ago. A vascular referral will be placed as well and patient will contact office next week if not improved. Patient understood instructions.

## 2021-03-24 ENCOUNTER — Ambulatory Visit: Payer: Self-pay | Admitting: Student-PharmD

## 2021-03-24 DIAGNOSIS — K219 Gastro-esophageal reflux disease without esophagitis: Secondary | ICD-10-CM

## 2021-03-24 DIAGNOSIS — I10 Essential (primary) hypertension: Secondary | ICD-10-CM

## 2021-03-24 NOTE — Chronic Care Management (AMB) (Signed)
General Review Call   Mogensen,Shannon Li  [id number] 50 years, Female  DOB: 12-Jun-1950  M: [phone]   General Review  Completed by [name] on 03/24/2021 Chart Review What recent interventions/DTPs have been made by any provider to improve the patient's conditions in the last 3 months?:  03/17/21 McDonough, Si Gaul, PA-C For Hypertension. STOPPED Albuterol, Escitalopram, Hydrocodone, Pravastatin, Ranitidine, and Rosuvastatin.  Any recent hospitalizations or ED visits since last visit with CPP?: Yes Brief Summary (including Medication and/or Diagnosis changes):: 03/03/21 [place] (2 Hours) Lesly Rubenstein, MD. For ESOPHAGOGASTRODUODENOSCOPY (EGD) WITH PROPOFOL COLONOSCOPY WITH PROPOFOL. No medication changes.   Adherence Review Details Adherence rates for STAR metric medications: N/A Adherence rates for medications indicated for disease state being reviewed: N/A. Does the patient have >5 day gap between last estimated fill dates for any of the above medications?: No  Disease State Questions Able to connect with the Patient?: Yes Did patient have any problems with their health recently?: Yes Note Problems and Concerns:: Patient stated she has knot/blood clot on her left arm that came from the procedure done on 03/03/21 but since then Mylinda Latina, PA-C has her taking aspirin and putting warm compresses on it and she stated its getting better and feels better as well.  Did patient have any problems with their pharmacy?: No Does patient have any issues or side effects with their medications?: No Additional  information to pass to Patient's CPP?: No Anything we can do to help take better care of Patient?: No Misc. Response/Information:: Patient stated she does not want to start or restart any statin drugs, She stated she just wants to wait till its time to redraw her blood and see if she even needs a statin drug.    Pharmacist Review   Adherence gaps identified?: No Drug Therapy Problems identified?: Yes Details: Resistant to starting statin for cholesterol, will need an updated Lipid Panel to evaulate need Assessment: Controlled  11 minutes spent in review, coordination, and documentation.  Reviewed by: Alena Bills, PharmD Clinical Pharmacist [phone]

## 2021-03-26 DIAGNOSIS — Z23 Encounter for immunization: Secondary | ICD-10-CM | POA: Diagnosis not present

## 2021-04-02 ENCOUNTER — Other Ambulatory Visit: Payer: Medicare Other

## 2021-04-07 ENCOUNTER — Other Ambulatory Visit: Payer: Self-pay

## 2021-04-07 ENCOUNTER — Ambulatory Visit (INDEPENDENT_AMBULATORY_CARE_PROVIDER_SITE_OTHER): Payer: Medicare Other | Admitting: Nurse Practitioner

## 2021-04-07 VITALS — BP 134/82 | HR 71 | Ht 68.0 in | Wt 168.0 lb

## 2021-04-07 DIAGNOSIS — I10 Essential (primary) hypertension: Secondary | ICD-10-CM

## 2021-04-07 DIAGNOSIS — R1114 Bilious vomiting: Secondary | ICD-10-CM

## 2021-04-07 DIAGNOSIS — I82612 Acute embolism and thrombosis of superficial veins of left upper extremity: Secondary | ICD-10-CM

## 2021-04-08 ENCOUNTER — Other Ambulatory Visit: Payer: Self-pay

## 2021-04-08 ENCOUNTER — Ambulatory Visit
Admission: RE | Admit: 2021-04-08 | Discharge: 2021-04-08 | Disposition: A | Discharge status: Home / Self Care | Payer: Medicare Other | Source: Ambulatory Visit | Attending: Gastroenterology | Admitting: Gastroenterology

## 2021-04-08 DIAGNOSIS — R1114 Bilious vomiting: Secondary | ICD-10-CM | POA: Insufficient documentation

## 2021-04-08 LAB — POCT I-STAT CREATININE: Creatinine, Ser: 2 mg/dL — ABNORMAL HIGH (ref 0.44–1.00)

## 2021-04-08 MED ORDER — IOHEXOL 350 MG/ML SOLN: 100.0000 mL | Freq: Once | INTRAVENOUS | Status: DC | PRN

## 2021-04-11 ENCOUNTER — Other Ambulatory Visit: Payer: Self-pay | Admitting: Internal Medicine

## 2021-04-14 ENCOUNTER — Encounter (INDEPENDENT_AMBULATORY_CARE_PROVIDER_SITE_OTHER): Payer: Self-pay | Admitting: Nurse Practitioner

## 2021-04-14 NOTE — Progress Notes (Signed)
Subjective:    Patient ID: Shannon Li, female    DOB: July 05, 1950, 70 y.o.   MRN: [id number] Chief Complaint  Patient presents with   New Patient (Initial Visit)    consult    Shannon Li is a 70 year old female that presents today for evaluation of superficial thrombophlebitis in her left upper extremity.  She noticed tenderness with some redness and discomfort following an IV placement after a work-up for some nausea and vomiting that she has been having.  She notes that the swelling and tenderness began to spread somewhat and she went to her PCP for evaluation.  After a upper extremity ultrasound was found that she had a superficial thrombophlebitis in the area of IV placement.  Since that time she has been taking 325 mg of aspirin and warm compresses and has noted that the swelling and pain is subsided.  It has been approximately a month since the initial diagnosis.  She notes that overall it is doing much better.   Review of Systems  Constitutional:  Positive for unexpected weight change.  Gastrointestinal:  Positive for nausea.  All other systems reviewed and are negative.     Objective:   Physical Exam Vitals reviewed.  HENT:     Head: Normocephalic.  Cardiovascular:     Rate and Rhythm: Normal rate.     Pulses: Normal pulses.  Pulmonary:     Effort: Pulmonary effort is normal.  Musculoskeletal:        General: No tenderness.  Skin:    General: Skin is warm and dry.  Neurological:     Mental Status: She is alert and oriented to person, place, and time.  Psychiatric:        Mood and Affect: Mood normal.        Behavior: Behavior normal.        Thought Content: Thought content normal.        Judgment: Judgment normal.    BP 134/82    Pulse 71    Ht 5\' 8"  (1.727 m)    Wt 168 lb (76.2 kg)    BMI 25.54 kg/m   Past Medical History:  Diagnosis Date   Anxiety    Arthritis    Atrophic kidney    Chronic constipation    Colon polyp 2013   COPD (chronic  obstructive pulmonary disease) (HCC)    Depression    Diverticulosis    Esophageal stricture    Esophagitis    Fibrocystic breast disease    GERD (gastroesophageal reflux disease)    History of hiatal hernia    Hypertension    IBS (irritable bowel syndrome)    Menopause    Ovarian cyst    Perirectal abscess    Plantar fasciitis    Postmenopausal    Renal artery occlusion (HCC)    Renal insufficiency    Right kidney is small and not functioning.    Social History   Socioeconomic History   Marital status: Widowed    Spouse name: Not on file   Number of children: Not on file   Years of education: Not on file   Highest education level: Not on file  Occupational History   Not on file  Tobacco Use   Smoking status: Every Day    Packs/day: 0.50    Years: 30.00    Pack years: 15.00    Types: Cigarettes   Smokeless tobacco: Never   Tobacco comments:    since age 46, pt trying  to cut back  Vaping Use   Vaping Use: Never used  Substance and Sexual Activity   Alcohol use: No   Drug use: No   Sexual activity: Not on file  Other Topics Concern   Not on file  Social History Narrative   Not on file   Social Determinants of Health   Financial Resource Strain: Low Risk    Difficulty of Paying Living Expenses: Not very hard  Food Insecurity: Not on file  Transportation Needs: Not on file  Physical Activity: Not on file  Stress: Not on file  Social Connections: Not on file  Intimate Partner Violence: Not on file    Past Surgical History:  Procedure Laterality Date   ABSCESS DRAINAGE  11/17/2013   buttock right   BREAST BIOPSY Right    neg   CATARACT EXTRACTION W/PHACO Right 08/17/2019   Procedure: CATARACT EXTRACTION PHACO AND INTRAOCULAR LENS PLACEMENT ([place]) RIGHT VISION BLUE 5.32  00:42.7;  Surgeon: Marchia Meiers, MD;  Location: [facility];  Service: Ophthalmology;  Laterality: Right;   CATARACT EXTRACTION W/PHACO Left 09/21/2019   Procedure: CATARACT  EXTRACTION PHACO AND INTRAOCULAR LENS PLACEMENT ([place]) LEFT VISION BLUE;  Surgeon: Marchia Meiers, MD;  Location: [facility];  Service: Ophthalmology;  Laterality: Left;  6.24 0:40.6   CHOLECYSTECTOMY  2008   COLONOSCOPY  2014   [facility] [place] N/A 08/26/2015   Procedure: COLONOSCOPY WITH PROPOFOL;  Surgeon: Hulen Luster, MD;  [facility] END[facility]COPY;  Service: Gastroenterology;  Laterality: N/A;   COLONOSCOPY WITH PROPOFOL N/A 03/03/2021   Procedure: COLONOSCOPY WITH PROPOFOL;  Surgeon: Lesly Rubenstein, MD;  Location: [facility] ENDOSCOPY;  Service: Endoscopy;  Laterality: N/A;   DILATION AND CURETTAGE OF UTERUS     ESOPHAGOGASTRODUODENOSCOPY N/A 11/15/2014   Procedure: ESOPHAGOGASTRODUODENOSCOPY (EGD);  Surgeon: Hulen Luster, MD;  Location: [facility] ENDOSCOPY;  Service: Gastroenterology;  Laterality: N/A;   ESOPHAGOGASTRODUODENOSCOPY (EGD) WITH PROPOFOL N/A 03/03/2021   Procedure: ESOPHAGOGASTRODUODENOSCOPY (EGD) WITH PROPOFOL;  Surgeon: Lesly Rubenstein, MD;  Location: [facility] ENDOSCOPY;  Service: Endoscopy;  Laterality: N/A;   EYE SURGERY     GASTROSTOMY     UPPER GI ENDOSCOPY  2014   [facility] [place]    Family History  Problem Relation Age of Onset   Heart disease Mother    Heart attack Mother    Diabetes Mother    Alzheimer's disease Father    Breast cancer Sister 3   Lung cancer Brother    Brain cancer Brother    Lung cancer Sister    Bladder Cancer Sister    Lung cancer Brother    Leukemia Brother    Diabetes Brother     Allergies  Allergen Reactions   Bupropion Nausea And Vomiting   Penicillin V Potassium Other (See Comments)   Penicillins Hives    CBC Latest Ref Rng & Units 09/11/2020 09/07/2019 08/22/2018  WBC 3.4 - 10.8 x10E3/uL 6.7 6.7 6.5  Hemoglobin 11.1 - 15.9 g/dL 15.2 15.4 14.9  Hematocrit 34.0 - 46.6 % 46.3 45.9 45.4  Platelets 150 - 450 x10E3/uL 373 292 297      CMP     Component Value Date/Time    NA 140 09/11/2020 0924   NA 136 11/17/2013 1038   K 4.8 09/11/2020 0924   K 3.4 (L) 11/17/2013 1038   CL 99 09/11/2020 0924   CL 101 11/17/2013 1038   CO2 26 09/11/2020 0924   CO2 28  11/17/2013 1038   GLUCOSE 103 (H) 09/11/2020 0924   GLUCOSE 117 (H) 11/17/2013 1038   BUN 20 09/11/2020 0924   BUN 16 11/17/2013 1038   CREATININE 2.00 (H) 04/08/2021 1422   CREATININE 1.62 (H) 11/17/2013 1038   CALCIUM 9.7 09/11/2020 0924   CALCIUM 8.5 11/17/2013 1038   PROT 6.9 09/11/2020 0924   ALBUMIN 4.5 09/11/2020 0924   AST 18 09/11/2020 0924   ALT 11 09/11/2020 0924   ALKPHOS 90 09/11/2020 0924   BILITOT 0.3 09/11/2020 0924   GFRNONAA 38 (L) 09/07/2019 1040   GFRNONAA 33 (L) 11/17/2013 1038   GFRAA 44 (L) 09/07/2019 1040   GFRAA 38 (L) 11/17/2013 1038     No results found.     Assessment & Plan:   1. Superficial venous thrombosis of arm, left It has been approximately a month since the patient's initial referral.  Since that time she has been using warm compresses and 325 mg of aspirin.  This is typically the indicated treatment for superficial thrombophlebitis.  At this time the pain and tenderness in the area is nearly gone.  We typically recommend this treatment for approximately 6 weeks.  Because this is essentially resolved we will have the patient follow-up with [place] on an as-needed basis if symptoms recur or if she begins to have worsening swelling in her upper extremity.  2. Essential hypertension Continue antihypertensive medications as already ordered, these medications have been reviewed and there are no changes at this time.   3. Bilious vomiting with nausea The patient symptoms do not sound entirely consistent with mesenteric artery stenosis however cannot be fully ruled out.  However she is currently undergoing work-up by her PCP and has an upcoming CT angio of her abdomen and pelvis.  We will be happy to assist if necessary based on the results of the scan.   Current  Outpatient Medications on File Prior to Visit  Medication Sig Dispense Refill   amLODipine (NORVASC) 5 MG tablet Take 1 tablet (5 mg total) by mouth daily. 90 tablet 1   bisoprolol-hydrochlorothiazide (ZIAC) 2.5-6.25 MG tablet TAKE 1 TABLET BY MOUTH EVERY DAY FOR BLOOD PRESSURE 90 tablet 1   DULoxetine (CYMBALTA) 30 MG capsule TAKE 1 CAPSULE BY MOUTH EVERY DAY 90 capsule 3   famotidine (PEPCID) 40 MG tablet Take 40 mg by mouth daily.     fluticasone furoate-vilanterol (BREO ELLIPTA) 100-25 MCG/ACT AEPB Inhale 1 puff into the lungs daily. 1 each 11   pantoprazole (PROTONIX) 40 MG tablet TAKE 1 TABLET BY MOUTH TWICE A DAY 180 tablet 1   sulfamethoxazole-trimethoprim (BACTRIM DS) 800-160 MG tablet Take 1 tablet by mouth 2 (two) times daily. (Patient not taking: Reported on 03/17/2021) 20 tablet 0   No current facility-administered medications on file prior to visit.    There are no Patient Instructions on file for this visit. No follow-ups on file.   Kris Hartmann, NP

## 2021-04-15 ENCOUNTER — Telehealth: Payer: Self-pay

## 2021-04-15 ENCOUNTER — Ambulatory Visit
Admission: RE | Admit: 2021-04-15 | Discharge: 2021-04-15 | Disposition: A | Discharge status: Home / Self Care | Payer: Medicare Other | Source: Ambulatory Visit | Attending: Gastroenterology | Admitting: Gastroenterology

## 2021-04-15 ENCOUNTER — Other Ambulatory Visit: Payer: Self-pay

## 2021-04-15 DIAGNOSIS — R1114 Bilious vomiting: Secondary | ICD-10-CM | POA: Insufficient documentation

## 2021-04-15 DIAGNOSIS — Z539 Procedure and treatment not carried out, unspecified reason: Secondary | ICD-10-CM | POA: Diagnosis not present

## 2021-04-15 LAB — POCT I-STAT CREATININE: Creatinine, Ser: 1.9 mg/dL — ABNORMAL HIGH (ref 0.44–1.00)

## 2021-04-15 NOTE — Telephone Encounter (Signed)
Spoke with pt she said she going wait for GI dr and let is know we will make appt

## 2021-04-18 DIAGNOSIS — J449 Chronic obstructive pulmonary disease, unspecified: Secondary | ICD-10-CM | POA: Diagnosis not present

## 2021-04-18 DIAGNOSIS — I10 Essential (primary) hypertension: Secondary | ICD-10-CM | POA: Diagnosis not present

## 2021-04-24 DIAGNOSIS — Z85828 Personal history of other malignant neoplasm of skin: Secondary | ICD-10-CM | POA: Diagnosis not present

## 2021-04-24 DIAGNOSIS — L570 Actinic keratosis: Secondary | ICD-10-CM | POA: Diagnosis not present

## 2021-04-24 DIAGNOSIS — D2261 Melanocytic nevi of right upper limb, including shoulder: Secondary | ICD-10-CM | POA: Diagnosis not present

## 2021-04-24 DIAGNOSIS — D485 Neoplasm of uncertain behavior of skin: Secondary | ICD-10-CM | POA: Diagnosis not present

## 2021-04-24 DIAGNOSIS — D2262 Melanocytic nevi of left upper limb, including shoulder: Secondary | ICD-10-CM | POA: Diagnosis not present

## 2021-04-24 DIAGNOSIS — B078 Other viral warts: Secondary | ICD-10-CM | POA: Diagnosis not present

## 2021-04-24 DIAGNOSIS — D2271 Melanocytic nevi of right lower limb, including hip: Secondary | ICD-10-CM | POA: Diagnosis not present

## 2021-04-24 DIAGNOSIS — X32XXXA Exposure to sunlight, initial encounter: Secondary | ICD-10-CM | POA: Diagnosis not present

## 2021-04-24 DIAGNOSIS — D2371 Other benign neoplasm of skin of right lower limb, including hip: Secondary | ICD-10-CM | POA: Diagnosis not present

## 2021-04-24 DIAGNOSIS — C44722 Squamous cell carcinoma of skin of right lower limb, including hip: Secondary | ICD-10-CM | POA: Diagnosis not present

## 2021-04-27 ENCOUNTER — Encounter: Payer: Self-pay | Admitting: Emergency Medicine

## 2021-04-27 ENCOUNTER — Ambulatory Visit
Admission: EM | Admit: 2021-04-27 | Discharge: 2021-04-27 | Disposition: A | Discharge status: Home / Self Care | Payer: Medicare HMO | Attending: Medical Oncology | Admitting: Medical Oncology

## 2021-04-27 DIAGNOSIS — R0981 Nasal congestion: Secondary | ICD-10-CM

## 2021-04-27 DIAGNOSIS — Z8709 Personal history of other diseases of the respiratory system: Secondary | ICD-10-CM

## 2021-04-27 DIAGNOSIS — R051 Acute cough: Secondary | ICD-10-CM | POA: Diagnosis not present

## 2021-04-27 DIAGNOSIS — H66003 Acute suppurative otitis media without spontaneous rupture of ear drum, bilateral: Secondary | ICD-10-CM | POA: Diagnosis not present

## 2021-04-27 MED ORDER — DOXYCYCLINE HYCLATE 100 MG PO CAPS: 100.0000 mg | ORAL_CAPSULE | Freq: Two times a day (BID) | ORAL | 0 refills | Status: DC

## 2021-04-27 MED ORDER — FLUTICASONE PROPIONATE 50 MCG/ACT NA SUSP: 2.0000 | Freq: Every day | NASAL | 0 refills | Status: DC

## 2021-04-27 MED ORDER — PREDNISONE 10 MG PO TABS: ORAL_TABLET | ORAL | 0 refills | Status: DC

## 2021-04-27 NOTE — Discharge Instructions (Addendum)
You have an ear infection You do not have a sinus infection but have some sinus irritation and inflammation from your viral illness.  The prednisone will help with your cough and sinus inflammation

## 2021-04-27 NOTE — ED Provider Notes (Signed)
[facility]    CSN: [id number] Arrival date & time: 04/27/21  1031      History   Chief Complaint Chief Complaint  Patient presents with   Cough   Otalgia   Sinus Pressure    HPI Shannon Li is a 71 y.o. female.   HPI  Cold Symptoms: Patient reports that they have had symptoms of productive yellow/green cough, right ear pain, bilateral ear fullness, sinus congestion for the past 3 days. Symptoms are stable. They deny SOB, chest pain, fever or vomiting. They have tried OTC sinus medication for symptoms. No known sick contacts. Of note she does have COPD. She declines viral testing.    Past Medical History:  Diagnosis Date   Anxiety    Arthritis    Atrophic kidney    Chronic constipation    Colon polyp 2013   COPD (chronic obstructive pulmonary disease) (HCC)    Depression    Diverticulosis    Esophageal stricture    Esophagitis    Fibrocystic breast disease    GERD (gastroesophageal reflux disease)    History of hiatal hernia    Hypertension    IBS (irritable bowel syndrome)    Menopause    Ovarian cyst    Perirectal abscess    Plantar fasciitis    Postmenopausal    Renal artery occlusion (HCC)    Renal insufficiency    Right kidney is small and not functioning.    Patient Active Problem List   Diagnosis Date Noted   Impingement syndrome of left shoulder region 02/08/2021   Needs flu shot 03/04/2020   Cervical disc disease with myelopathy 10/28/2019   Degenerative disc disease, thoracic 10/28/2019   Gastroesophageal reflux disease without esophagitis 10/03/2018   Generalized anxiety disorder 10/03/2018   Chronic obstructive pulmonary disease ([place]) 09/18/2018   Acute upper respiratory infection 03/03/2018   Cough 03/03/2018   Bereavement due to life event 03/03/2018   Encounter for general adult medical examination with abnormal findings 08/24/2017   Dyspnea on exertion 08/24/2017   Dysuria 08/24/2017   Need for vaccination against  Streptococcus pneumoniae using pneumococcal conjugate vaccine 13 08/24/2017   Essential hypertension 06/02/2017   Inguinal abscess 05/23/2014   Cutaneous abscess of groin 05/23/2014   Cellulitis and abscess of trunk 05/23/2014   Ovarian cyst, complex 04/06/2012   Other ovarian cyst, unspecified side 04/06/2012   Ovarian retention cyst 04/06/2012    Past Surgical History:  Procedure Laterality Date   ABSCESS DRAINAGE  11/17/2013   buttock right   BREAST BIOPSY Right    neg   CATARACT EXTRACTION W/PHACO Right 08/17/2019   Procedure: CATARACT EXTRACTION PHACO AND INTRAOCULAR LENS PLACEMENT ([place]) RIGHT VISION BLUE 5.32  00:42.7;  Surgeon: Marchia Meiers, MD;  Location: [facility];  Service: Ophthalmology;  Laterality: Right;   CATARACT EXTRACTION W/PHACO Left 09/21/2019   Procedure: CATARACT EXTRACTION PHACO AND INTRAOCULAR LENS PLACEMENT ([place]) LEFT VISION BLUE;  Surgeon: Marchia Meiers, MD;  Location: [place];  Service: Ophthalmology;  Laterality: Left;  6.24 0:40.6   CHOLECYSTECTOMY  2008   COLONOSCOPY  2014   [facility] [place] N/A 08/26/2015   Procedure: COLONOSCOPY WITH PROPOFOL;  Surgeon: Hulen Luster, MD;  Location: [facility] ENDOSCOPY;  Service: Gastroenterology;  Laterality: N/A;   COLONOSCOPY WITH PROPOFOL N/A 03/03/2021   Procedure: COLONOSCOPY WITH PROPOFOL;  Surgeon: Lesly Rubenstein, MD;  Location: [facility] ENDOSCOPY;  Service: Endoscopy;  Laterality: N/A;   DILATION  AND CURETTAGE OF UTERUS     ESOPHAGOGASTRODUODENOSCOPY N/A 11/15/2014   Procedure: ESOPHAGOGASTRODUODENOSCOPY (EGD);  Surgeon: Hulen Luster, MD;  Location: [facility] ENDOSCOPY;  Service: Gastroenterology;  Laterality: N/A;   ESOPHAGOGASTRODUODENOSCOPY (EGD) WITH PROPOFOL N/A 03/03/2021   Procedure: ESOPHAGOGASTRODUODENOSCOPY (EGD) WITH PROPOFOL;  Surgeon: Lesly Rubenstein, MD;  Location: [facility] ENDOSCOPY;  Service: Endoscopy;  Laterality: N/A;   EYE SURGERY      GASTROSTOMY     UPPER GI ENDOSCOPY  2014   [facility] [place] History   No obstetric history on file.      Home Medications    Prior to Admission medications   Medication Sig Start Date End Date Taking? Authorizing Provider  amLODipine (NORVASC) 5 MG tablet Take 1 tablet (5 mg total) by mouth daily. 12/24/20   Lavera Guise, MD  bisoprolol-hydrochlorothiazide [facility]) 2.5-6.25 MG tablet TAKE 1 TABLET BY MOUTH EVERY DAY FOR BLOOD PRESSURE 03/09/21   Jonetta Osgood, NP  DULoxetine (CYMBALTA) 30 MG capsule TAKE 1 CAPSULE BY MOUTH EVERY DAY 09/11/20   Lavera Guise, MD  famotidine (PEPCID) 40 MG tablet Take 40 mg by mouth daily.    [provider]  fluticasone furoate-vilanterol (BREO ELLIPTA) 100-25 MCG/ACT AEPB Inhale 1 puff into the lungs daily. 02/06/21   McDonough, Si Gaul, PA-C  pantoprazole (PROTONIX) 40 MG tablet TAKE 1 TABLET BY MOUTH TWICE A DAY 03/04/21   Lavera Guise, MD  rosuvastatin (CRESTOR) 5 MG tablet TAKE ONE TABLET BY MOUTH EVERY EVENING 04/11/21   Jonetta Osgood, NP  sulfamethoxazole-trimethoprim (BACTRIM DS) 800-160 MG tablet Take 1 tablet by mouth 2 (two) times daily. Patient not taking: Reported on 03/17/2021 03/07/21   Mylinda Latina, PA-C    Family History Family History  Problem Relation Age of Onset   Heart disease Mother    Heart attack Mother    Diabetes Mother    Alzheimer's disease Father    Breast cancer Sister 39   Lung cancer Brother    Brain cancer Brother    Lung cancer Sister    Bladder Cancer Sister    Lung cancer Brother    Leukemia Brother    Diabetes Brother     Social History Social History   Tobacco Use   Smoking status: Every Day    Packs/day: 0.50    Years: 30.00    Pack years: 15.00    Types: Cigarettes   Smokeless tobacco: Never   Tobacco comments:    since age 51, pt trying to cut back  Vaping Use   Vaping Use: Never used  Substance Use Topics   Alcohol use: No   Drug use: No      Allergies   Bupropion, Penicillin v potassium, and Penicillins   Review of Systems Review of Systems  As stated above in HPI  Physical Exam Triage Vital Signs ED Triage Vitals  Enc Vitals Group     BP 04/27/21 1145 123/81     Pulse Rate 04/27/21 1145 81     Resp 04/27/21 1145 16     Temp 04/27/21 1145 99.2 F (37.3 C)     Temp Source 04/27/21 1145 Oral     SpO2 04/27/21 1145 92 %     Weight --      Height --      Head Circumference --      Peak Flow --      Pain Score 04/27/21 1149 0     Pain  Loc --      Pain Edu? --      Excl. in [place]? --    No data found.  Updated Vital Signs BP 123/81 (BP Location: Left Arm)    Pulse 81    Temp 99.2 F (37.3 C) (Oral)    Resp 16    SpO2 92%   Physical Exam Vitals and nursing note reviewed.  Constitutional:      General: She is not in acute distress.    Appearance: Normal appearance. She is not ill-appearing, toxic-appearing or diaphoretic.  HENT:     Head: Normocephalic and atraumatic.     Ears:     Comments: Bilateral Tms with erythema and edema (right worse than the left)    Nose: Congestion (No sinus bogginess) and rhinorrhea (mild clear) present.     Mouth/Throat:     Mouth: Mucous membranes are moist.     Pharynx: Oropharynx is clear. No oropharyngeal exudate or posterior oropharyngeal erythema.     Comments: Mild post nasal drainage Eyes:     General:        Right eye: No discharge.        Left eye: No discharge.     Extraocular Movements: Extraocular movements intact.     Pupils: Pupils are equal, round, and reactive to light.  Cardiovascular:     Rate and Rhythm: Normal rate and regular rhythm.     Heart sounds: Normal heart sounds.  Pulmonary:     Effort: Pulmonary effort is normal.     Breath sounds: No wheezing or rhonchi.  Musculoskeletal:     Cervical back: Normal range of motion and neck supple.  Lymphadenopathy:     Cervical: No cervical adenopathy.  Skin:    General: Skin is warm.   Neurological:     General: No focal deficit present.     Mental Status: She is alert and oriented to person, place, and time.     UC Treatments / Results  Labs (all labs ordered are listed, but only abnormal results are displayed) Labs Reviewed - No data to display  EKG   Radiology No results found.  Procedures Procedures (including critical care time)  Medications Ordered in UC Medications - No data to display  Initial Impression / Assessment and Plan / UC Course  I have reviewed the triage vital signs and the nursing notes.  Pertinent labs & imaging results that were available during my care of the patient were reviewed by me and considered in my medical decision making (see chart for details).     New.  Attempted to educate patient on her symptoms and diagnosis along with medications. She still contends that she has a sinus infection which I discussed. Sinus rinse and flonase encouraged. Given her COPD and AOM sending in prednisone and doxycyline. Red flag signs and symptoms discussed. Follow up PRN.    Final Clinical Impressions(s) / UC Diagnoses   Final diagnoses:  None   Discharge Instructions   None    ED Prescriptions   None    PDMP not reviewed this encounter.   Hughie Closs, [place] 04/27/21 1221

## 2021-04-27 NOTE — ED Triage Notes (Signed)
Pt presents with sinus pressure/pain, cough, right side ear feels clogged x 3 days

## 2021-05-05 ENCOUNTER — Ambulatory Visit (INDEPENDENT_AMBULATORY_CARE_PROVIDER_SITE_OTHER): Payer: Medicare HMO | Admitting: Physician Assistant

## 2021-05-05 ENCOUNTER — Other Ambulatory Visit: Payer: Self-pay

## 2021-05-05 ENCOUNTER — Encounter: Payer: Self-pay | Admitting: Physician Assistant

## 2021-05-05 VITALS — BP 138/72 | HR 68 | Temp 98.4°F | Resp 16 | Ht 68.0 in | Wt 162.0 lb

## 2021-05-05 DIAGNOSIS — H6693 Otitis media, unspecified, bilateral: Secondary | ICD-10-CM | POA: Diagnosis not present

## 2021-05-05 DIAGNOSIS — J0100 Acute maxillary sinusitis, unspecified: Secondary | ICD-10-CM

## 2021-05-05 MED ORDER — SULFAMETHOXAZOLE-TRIMETHOPRIM 800-160 MG PO TABS: 1.0000 | ORAL_TABLET | Freq: Two times a day (BID) | ORAL | 0 refills | Status: DC

## 2021-05-05 MED ORDER — AZITHROMYCIN 250 MG PO TABS: ORAL_TABLET | ORAL | 0 refills | Status: DC

## 2021-05-05 NOTE — Progress Notes (Signed)
[facility] [place], [place] [address]  Internal MEDICINE  Office Visit Note  Patient Name: Shannon Li  [id number]  [id number]  Date of Service: 05/09/2021  Chief Complaint  Patient presents with   Ear Pain    Having ongoing pain in both ears and started last Saturday - was prescribed doxycycline for ear infection, but was not effective - causing weakness and some dizziness     HPI Pt is here for a sick visit. Martin Majestic to walk in clinic and diagnosed with bilateral ear infections and started on Doxycycline last week -She states it has not helped at all and now she feels nauseous and dizzy as well -using her flonase -will give her mucinex samples to start on to help with congestion as well -Discussed change to bactrim as she has done well with this to try with sinus/ear infection and see if nausea/dizziness improves after stopping doxycycline or if byproduct of infection/congestion.  Current Medication:  Outpatient Encounter Medications as of 05/05/2021  Medication Sig   amLODipine (NORVASC) 5 MG tablet Take 1 tablet (5 mg total) by mouth daily.   bisoprolol-hydrochlorothiazide (ZIAC) 2.5-6.25 MG tablet TAKE 1 TABLET BY MOUTH EVERY DAY FOR BLOOD PRESSURE   DULoxetine (CYMBALTA) 30 MG capsule TAKE 1 CAPSULE BY MOUTH EVERY DAY   famotidine (PEPCID) 40 MG tablet Take 40 mg by mouth daily.   fluticasone (FLONASE) 50 MCG/ACT nasal spray Place 2 sprays into both nostrils daily.   fluticasone furoate-vilanterol (BREO ELLIPTA) 100-25 MCG/ACT AEPB Inhale 1 puff into the lungs daily.   pantoprazole (PROTONIX) 40 MG tablet TAKE 1 TABLET BY MOUTH TWICE A DAY   predniSONE (DELTASONE) 10 MG tablet Take 4 tablets by mouth with breakfast for 2 days, 2 tablets by mouth for 2 days and 1 tablet by mouth for 2 days.   rosuvastatin (CRESTOR) 5 MG tablet TAKE ONE TABLET BY MOUTH EVERY EVENING   sulfamethoxazole-trimethoprim (BACTRIM DS) 800-160 MG tablet Take 1 tablet by  mouth 2 (two) times daily.   [DISCONTINUED] azithromycin (ZITHROMAX) 250 MG tablet Take one tab a day for 10 days for uri   [DISCONTINUED] doxycycline (VIBRAMYCIN) 100 MG capsule Take 1 capsule (100 mg total) by mouth 2 (two) times daily.   No facility-administered encounter medications on file as of 05/05/2021.      Medical History: Past Medical History:  Diagnosis Date   Anxiety    Arthritis    Atrophic kidney    Chronic constipation    Colon polyp 2013   COPD (chronic obstructive pulmonary disease) (HCC)    Depression    Diverticulosis    Esophageal stricture    Esophagitis    Fibrocystic breast disease    GERD (gastroesophageal reflux disease)    History of hiatal hernia    Hypertension    IBS (irritable bowel syndrome)    Menopause    Ovarian cyst    Perirectal abscess    Plantar fasciitis    Postmenopausal    Renal artery occlusion (HCC)    Renal insufficiency    Right kidney is small and not functioning.     Vital Signs: BP 138/72 Comment: 135/96   Pulse 68    Temp 98.4 F (36.9 C)    Resp 16    Ht 5\' 8"  (1.727 m)    Wt 162 lb (73.5 kg)    SpO2 97%    BMI 24.63 kg/m    Review of Systems  Constitutional:  Negative for fatigue  and fever.  HENT:  Positive for congestion, ear pain, postnasal drip and sinus pressure. Negative for mouth sores.   Respiratory:  Negative for cough.   Cardiovascular:  Negative for chest pain.  Gastrointestinal:  Positive for nausea.  Genitourinary:  Negative for flank pain.  Neurological:  Positive for dizziness.  Psychiatric/Behavioral: Negative.     Physical Exam Vitals and nursing note reviewed.  Constitutional:      General: She is not in acute distress.    Appearance: She is well-developed and normal weight. She is not diaphoretic.  HENT:     Head: Normocephalic and atraumatic.     Ears:     Comments: TM with erythema    Nose: Congestion present.     Mouth/Throat:     Pharynx: No oropharyngeal exudate.  Eyes:      Pupils: Pupils are equal, round, and reactive to light.  Neck:     Thyroid: No thyromegaly.     Vascular: No JVD.     Trachea: No tracheal deviation.  Cardiovascular:     Rate and Rhythm: Normal rate and regular rhythm.     Heart sounds: Normal heart sounds. No murmur heard.   No friction rub. No gallop.  Pulmonary:     Effort: Pulmonary effort is normal. No respiratory distress.     Breath sounds: No wheezing or rales.  Chest:     Chest wall: No tenderness.  Abdominal:     General: Bowel sounds are normal.     Palpations: Abdomen is soft.  Musculoskeletal:        General: Normal range of motion.     Cervical back: Normal range of motion and neck supple.  Lymphadenopathy:     Cervical: No cervical adenopathy.  Skin:    General: Skin is warm and dry.  Neurological:     Mental Status: She is alert and oriented to person, place, and time.     Cranial Nerves: No cranial nerve deficit.  Psychiatric:        Behavior: Behavior normal.        Thought Content: Thought content normal.        Judgment: Judgment normal.      Assessment/Plan: 1. Acute ear infection, bilateral Will stop doxycycline and start bactrim. Will start mucinex as well and continue flonase. May need ENT referral if not improving still. - sulfamethoxazole-trimethoprim (BACTRIM DS) 800-160 MG tablet; Take 1 tablet by mouth 2 (two) times daily.  Dispense: 14 tablet; Refill: 0  2. Acute non-recurrent maxillary sinusitis Will stop doxycycline and start bactrim. Will start mucinex as well and continue flonase   General Counseling: talani brazee understanding of the findings of todays visit and agrees with plan of treatment. I have discussed any further diagnostic evaluation that may be needed or ordered today. We also reviewed her medications today. she has been encouraged to call the office with any questions or concerns that should arise related to todays visit.    Counseling:    No orders of the  defined types were placed in this encounter.   Meds ordered this encounter  Medications   DISCONTD: azithromycin (ZITHROMAX) 250 MG tablet    Sig: Take one tab a day for 10 days for uri    Dispense:  10 tablet    Refill:  0   sulfamethoxazole-trimethoprim (BACTRIM DS) 800-160 MG tablet    Sig: Take 1 tablet by mouth 2 (two) times daily.    Dispense:  14 tablet  Refill:  0    Time spent:25 Minutes

## 2021-05-07 ENCOUNTER — Ambulatory Visit: Payer: Medicare HMO | Admitting: Student-PharmD

## 2021-05-07 ENCOUNTER — Other Ambulatory Visit: Payer: Self-pay

## 2021-05-07 DIAGNOSIS — E782 Mixed hyperlipidemia: Secondary | ICD-10-CM

## 2021-05-07 DIAGNOSIS — I10 Essential (primary) hypertension: Secondary | ICD-10-CM

## 2021-05-07 DIAGNOSIS — K219 Gastro-esophageal reflux disease without esophagitis: Secondary | ICD-10-CM

## 2021-05-07 NOTE — Progress Notes (Signed)
Follow Up Pharmacist Visit   Plitt,Shannon Li  [id number] 35 years, Female  DOB: August 08, 1950  M: [phone]  Clinical Summary Situation: Patient presents via telephone for CCM f/u visit Background: Patient is currently battling an ongoing ear infection and is not feeling well today. Reports home blood pressure values are all less than 140/90. Assessment: -Patient's BP is well-controlled -Patient reports long-term use of pantoprazole (will monitor and may attempt to taper in the future) -Patient is not currently taking anything for her cholesterol, which was elevated at last check in 08/2020 (stopped rosuvastatin due to arm muscle aches) Recommendations: -Continue current medication therapy -Will consult with PCP about cholesterol medication options  Pre-Call Questions  Completed by [name] on 05/05/2021  Are you able to connect with Patient?: Yes Visit Type: Phone Call May we confirm what is the best phone # for the pharmacist to call you?: [phone]  Have you been in the hospital or emergency room recently?: No Do you have any questions or concerns that you want to make sure your pharmacist addresses with your during your appointment?: No What, if any, problems do you have getting your medications from the pharmacy?: None Is there anything else that you would like me to pass along to your pharmacist or PCP?: Yes Details: Patient stated her ears are still hurting from the 8th of JAN, they told her she has a ear infection. She stated her medication that the urgent care gave her is not helping at all. Patient reminded to bring medication bottles to visit (not a list of meds) OR have medication bottles pulled out prior to appointment time: Done  Patient Chart Prep   Chronic Conditions Patient's Chronic Conditions: Hypertension (HTN), Chronic Obstructive Pulmonary Disease (COPD), Gastroesophageal Reflux Disease (GERD), Anxiety, Hyperlipidemia/Dyslipidemia (HLD)  Doctor and  Hospital Visits Were there PCP Visits since last visit with the Pharmacist?: No Were there Specialist Visits since last visit with the Pharmacist?: Yes Visit #1: 04/07/21 Vascular Surgery Kris Hartmann, NP. For Superficial venous thrombosis of arm/Initial visit. No medication changes. Was there a Hospital Visit in last 30 days?: Yes Reason for admission: Non-recurrent acute suppurative otitis media of both ears without spontaneous rupture of tympanic membranes  Admit Date: 04/27/2021 Discharge Date: 04/27/2021 Location Discharged from: [facility] Medications Started at hospital discharge (list medications started): STARTED Doxycycline Hyclate 100 mg 2 times daily, Fluticasone Propionate 50 MCG/ACT 2 sprays each nare daily, Prednisone 10 mg dose pack. Medication Changes at hospital discharge (list medications changed): None,. Medications Discontinued at hospital discharge (list medications discontinued): None. Medications that remain the same after Hospital Discharge (list medications continued): SAME. Were there other Hospital Visits since last visit with the Pharmacist?: No  Medication Information Have there been any medication changes from PCP or Specialist since last visit with the Pharmacist?: No Are there any Medication adherence gaps (beyond 5 days past due)?: Yes Details: Rosuvastatin 5 mg 90 DS 10/11/20 Medication adherence rates for the STAR rating drugs: Rosuvastatin 5 mg 90 DS 10/11/20 List Patient's current Care Gaps: No current Care Gaps identified  Disease Assessments  Subjective Information Current BP: 134/82 Current HR: 71 taken on: 04/07/2021 Weight: 168 BMI: 25.54 Last GFR: 36 taken on: 09/10/2020 Why did the patient present?: CCM F/u Visit Is Patient using [facility]?: No Name and location of Current pharmacy: [facility] Current Rx insurance plan: Blue Medicare Are meds synced by current pharmacy?: No Are meds delivered by  current pharmacy?: No - delivery  available but patient prefers to not use Would patient benefit from direct intervention of clinical lead in dispensing process to optimize clinical outcomes?: Yes Are [facility] pharmacy services available where patient lives?: Yes Is patient disadvantaged to use [facility]?: No [facility] services reviewed with patient and patient wishes to change pharmacy?: No Select reason patient declined to change pharmacies: Other Details: Will discuss at next appt Does patient experience delays in picking up medications due to transportation concerns (getting to pharmacy)?: No Any additional demeanor/mood notes?: Patient is still not feeling well from an ongoing ear infection, started a new antibiotic on Monday  Hypertension (HTN) Assess this condition today?: Yes Is patient able to obtain BP reading today?: No Goal: <130/80 mmHG Hypertension Stage: Stage 1 (SBP: 130-139 or DBP: 80-89) Is Patient checking BP at home?: Yes Patient home BP readings are ranging: Not able to recall specifics but states all SBP less than 140 How often does patient miss taking their blood pressure medications?: None Has patient experienced hypotension, dizziness, falls or bradycardia?: No Does Patient use RPM device?: No BP RPM device: Does patient qualify?: No We discussed: DASH diet:  following a diet emphasizing fruits and vegetables and low-fat dairy products along with whole grains, fish, poultry, and nuts. Reducing red meats and sugars., Targeting 150 minutes of aerobic activity per week, Proper Home BP Measurement Assessment:: Controlled Drug: Amlodipine 5mg  1 tablet daily Assessment: Appropriate, Effective, Safe, Accessible Additional Info: Patient currently checks blood pressure 2-3 times per week. Plan to: Continue current medication therapy HC Follow up: 2 months Pharmacist Follow up: 5 months  Hyperlipidemia/Dyslipidemia (HLD) Last Lipid panel on: 09/10/2020 TC  (Goal<200): 238 LDL: 146 HDL (Goal>40): 37 TG (Goal<150): 229 ASCVD 10-year risk?is:: Intermediate (7.5%-20%) ASCVD Risk Score: 15.6 Assess this condition today?: Yes LDL Goal: <100 Has patient tried and failed any HLD Medications?: Yes Medications failed: pravastatin, rosuvastatin pravastatin: Muscle aches rosuvastatin:  Muscle aches Check present secondary causes (below) that can lead to increased cholesterol levels (multi-choice optional): Thiazide diuretics We discussed: How to reduce cholesterol through diet/weight management and physical activity. Assessment:: Controlled Drug: Rosuvastatin 5mg  1 tablet daily Assessment: Appropriate, Effective, Safe, Accessible Additional Info: Patient stopped herself, due to arm muscle aches Plan to (other): Consult with PCP about zetia/fish oil HC Follow up: 2 months Pharmacist Follow up: 5 months  GERD Assessment Assess this condition today?: Yes How frequently do you have symptoms of GERD or reflux?: weekly When does patient experience reflux symptoms?: After meals, When lying down Does patient experience difficulty or pain with swallowing?: No How long have you taken prescription medications for GERD?: Many years What potential triggering foods have you identified and tried to limit intake in your diet?: none We discussed: Eating smaller, more frequent meals Assessment:: Controlled Drug: Pantoprazole 40mg  1 tablet twice daily Assessment: Appropriate, Effective, Safe, Accessible Drug: Famotidine 20mg  1 tablet daily Assessment: Appropriate, Effective, Safe, Accessible Additional Info: Patient has been taking pantoprazole for quite some time and feels both medications help. Will continue to monitor for now but would like to taper off pantoprazole in the future to relieve pill burden and avoid side effects of long-term use. Plan to (other): Continue current medicaiton therapy HC Follow up: 2 months Pharmacist Follow up: 5  months  Exercise, Diet and Non-Drug Coordination Needs Additional exercise counseling points. We discussed: targeting at least 151 minutes per week of moderate-intensity aerobic exercise. Additional diet counseling points. We discussed: key components of the DASH diet Discussed Non-Drug Care Coordination Needs:  Yes Does Patient have Medication financial barriers?: No  Accountable Health Communities Health-Related Social Needs Screening Tool -  SDOH  ([email or web address]) What is your living situation today? (ref #1): I have a steady place to live Think about the place you live. Do you have problems with any of the following? (ref #2): None of the above Within the past 12 months, you worried that your food would run out before you got money to buy more (ref #3): Never true Within the past 12 months, the food you bought just didn't last and you didn't have money to get more (ref #4): Never true In the past 12 months, has lack of reliable transportation kept you from medical appointments, meetings, work or from getting things needed for daily living? (ref #5): No How often does anyone, including family and friends, physically hurt you? (ref #7): Never (1) How often does anyone, including family and friends, insult or talk down to you? (ref #8): Never (1) How often does anyone, including friends and family, threaten you with harm? (ref #9): Never (1) How often does anyone, including family and friends, scream or curse at you? (ref #10): Never (1)  Fat and Cholesterol Restricted Eating Plan Getting too much fat and cholesterol in your diet may cause health problems. Choosing the right foods helps keep your fat and cholesterol at normal levels. This can keep you from getting certain diseases. Your doctor may recommend an eating plan that includes: Total fat: ______% or less of total calories a day. This is ______g of fat a day. Saturated fat:  ______% or less of total calories a day. This is ______g of saturated fat a day. Cholesterol: less than _________mg a day. Fiber: ______g a day. What are tips for following this plan? General tips Work with your doctor to lose weight if you need to. Avoid: Foods with added sugar. Fried foods. Foods with trans fat or partially hydrogenated oils. This includes some margarines and baked goods. If you drink alcohol: Limit how much you have to: 0-1 drink a day for women who are not pregnant. 0-2 drinks a day for men. Know how much alcohol is in a drink. In the [place] one drink equals one 12 oz bottle of beer (355 mL), one 5 oz glass of wine (148 mL), or one 1 oz glass of hard liquor (44 mL). Reading food labels Check food labels for: Trans fats. Partially hydrogenated oils. Saturated fat (g) in each serving. Cholesterol (mg) in each serving. Fiber (g) in each serving. Choose foods with healthy fats, such as: Monounsaturated fats and polyunsaturated fats. These include olive and canola oil, flaxseeds, walnuts, almonds, and seeds. Omega-3 fats. These are found in certain fish, flaxseed oil, and ground flaxseeds. Choose grain products that have whole grains. Look for the word "whole" as the first word in the ingredient list. Cooking Cook foods using low-fat methods. These include baking, boiling, grilling, and broiling. Eat more home-cooked foods. Eat at restaurants and buffets less often. Eat less fast food. Avoid cooking using saturated fats, such as butter, cream, palm oil, palm kernel oil, and coconut oil. Meal planning A plate with examples of foods in a healthy diet.  At meals, divide your plate into four equal parts: Fill one-half of your plate with vegetables, green salads, and fruit. Fill one-fourth of your plate with whole grains. Fill one-fourth of your plate with low-fat (lean) protein foods. Eat fish that is high in omega-3 fats at least two times a week. This includes  mackerel, tuna, sardines, and salmon. Eat foods that are high in fiber, such as whole grains, beans, apples, pears, berries, broccoli, carrots, peas, and barley. What foods should I eat? Fruits All fresh, canned (in natural juice), or frozen fruits. Vegetables Fresh or frozen vegetables (raw, steamed, roasted, or grilled). Green salads. Grains Whole grains, such as whole wheat or whole grain breads, crackers, cereals, and pasta. Unsweetened oatmeal, bulgur, barley, quinoa, or brown rice. Corn or whole wheat flour tortillas. Meats and other protein foods Ground beef (85% or leaner), grass-fed beef, or beef trimmed of fat. Skinless chicken or [place]. Ground chicken or [place]. Pork trimmed of fat. All fish and seafood. Egg whites. Dried beans, peas, or lentils. Unsalted nuts or seeds. Unsalted canned beans. Nut butters without added sugar or oil. Dairy Low-fat or nonfat dairy products, such as skim or 1% milk, 2% or reduced-fat cheeses, low-fat and fat-free ricotta or cottage cheese, or plain low-fat and nonfat yogurt. Fats and oils Tub margarine without trans fats. Light or reduced-fat mayonnaise and salad dressings. Avocado. Olive, canola, sesame, or safflower oils. The items listed above may not be a complete list of foods and beverages you can eat. Contact a dietitian for more information. What foods should I avoid? Fruits Canned fruit in heavy syrup. Fruit in cream or butter sauce. Fried fruit. Vegetables Vegetables cooked in cheese, cream, or butter sauce. Fried vegetables. Grains White bread. White pasta. White rice. Cornbread. Bagels, pastries, and croissants. Crackers and snack foods that contain trans fat and hydrogenated oils. Meats and other protein foods Fatty cuts of meat. Ribs, chicken wings, bacon, sausage, bologna, salami, chitterlings, fatback, hot dogs, bratwurst, and packaged lunch meats. Liver and organ meats. Whole eggs and egg yolks. Chicken and [place] with skin. Fried  meat. Dairy Whole or 2% milk, cream, half-and-half, and cream cheese. Whole milk cheeses. Whole-fat or sweetened yogurt. Full-fat cheeses. Nondairy creamers and whipped toppings. Processed cheese, cheese spreads, and cheese curds. Fats and oils Butter, stick margarine, lard, shortening, ghee, or bacon fat. Coconut, palm kernel, and palm oils. Beverages Alcohol. Sugar-sweetened drinks such as sodas, lemonade, and fruit drinks. Sweets and desserts Corn syrup, sugars, honey, and molasses. Candy. Jam and jelly. Syrup. Sweetened cereals. Cookies, pies, cakes, donuts, muffins, and ice cream. The items listed above may not be a complete list of foods and beverages you should avoid. Contact a dietitian for more information. Summary Choosing the right foods helps keep your fat and cholesterol at normal levels. This can keep you from getting certain diseases. At meals, fill one-half of your plate with vegetables, green salads, and fruits. Eat high fiber foods, like whole grains, beans, apples, pears, berries, carrots, peas, and barley. Limit added sugar, saturated fats, alcohol, and fried foods. This information is not intended to replace advice given to you by your health care provider. Make sure you discuss any questions you have with your health care provider       [place] Pharmacist [phone]

## 2021-05-09 ENCOUNTER — Telehealth: Payer: Self-pay

## 2021-05-09 ENCOUNTER — Other Ambulatory Visit: Payer: Self-pay | Admitting: Physician Assistant

## 2021-05-09 DIAGNOSIS — H6693 Otitis media, unspecified, bilateral: Secondary | ICD-10-CM

## 2021-05-09 DIAGNOSIS — R42 Dizziness and giddiness: Secondary | ICD-10-CM

## 2021-05-09 MED ORDER — MECLIZINE HCL 12.5 MG PO TABS: ORAL_TABLET | ORAL | 1 refills | Status: DC

## 2021-05-09 NOTE — Telephone Encounter (Signed)
Ent appointment> 05/13/21 @ 10:00-Shannon Li

## 2021-05-09 NOTE — Telephone Encounter (Signed)
ENT referral sent

## 2021-05-09 NOTE — Telephone Encounter (Signed)
Otolaryngology referral sent urgent via [facility] to [facility]

## 2021-05-09 NOTE — Telephone Encounter (Signed)
error 

## 2021-05-13 DIAGNOSIS — H66001 Acute suppurative otitis media without spontaneous rupture of ear drum, right ear: Secondary | ICD-10-CM | POA: Diagnosis not present

## 2021-05-13 DIAGNOSIS — H6121 Impacted cerumen, right ear: Secondary | ICD-10-CM | POA: Diagnosis not present

## 2021-05-16 IMAGING — RF DG UGI W/ SMALL BOWEL
15 of 20 series · 15 of 20 positions shown · non-contrast
Comparison: CT 11/29/2017.

CLINICAL DATA: Persistent epigastric pain.

EXAM:
UPPER GI SERIES WITH SMALL BOWEL FOLLOW-THROUGH
FLUOROSCOPY TIME:  Fluoroscopy Time:  2 minutes 48 seconds
Radiation Exposure Index (if provided by the fluoroscopic device):
72.0 mGy
TECHNIQUE: Combined double contrast and single contrast upper GI series using
effervescent crystals, thick barium, and thin barium. Subsequently,
serial images of the small bowel were obtained including spot views
of the terminal ileum.

[Series 1: t abdomen barium ap · 0.14mm/px · 1 of 1 slices shown]
[im 1/1]
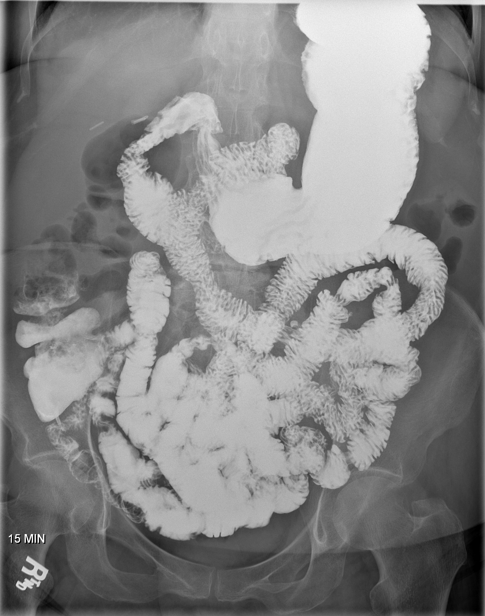

[Series 2: t abdomen supine · 0.15mm/px · 1 of 1 slices shown]
[im 1/1]
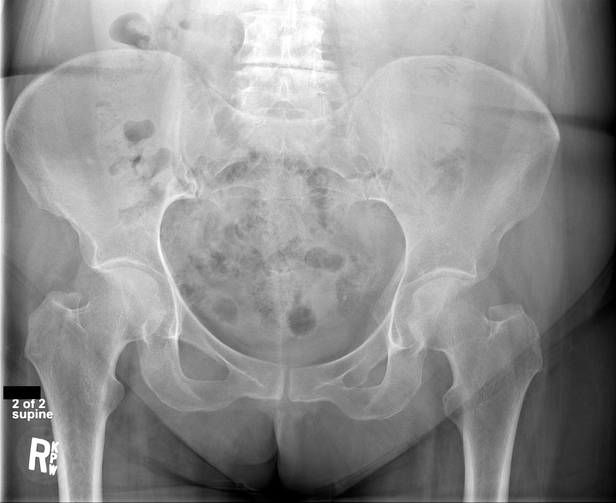

[Series 3: fluoro_barium singleshot_bw · 0.18mm/px · 1 of 1 slices shown (1 of 13)]
[im 1/1]
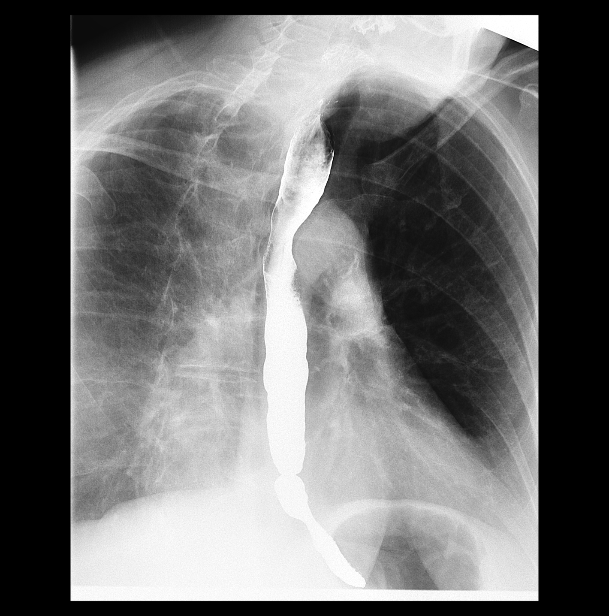

[Series 4: fluoro_barium singleshot_bw · 0.18mm/px · 1 of 1 slices shown (2 of 13)]
[im 1/1]
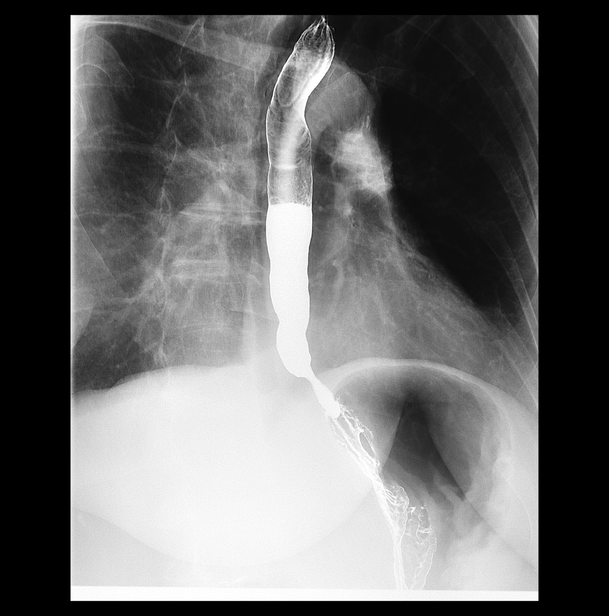

[Series 6: fluoro_barium singleshot_bw · 0.19mm/px · 1 of 1 slices shown (3 of 13)]
[im 1/1]
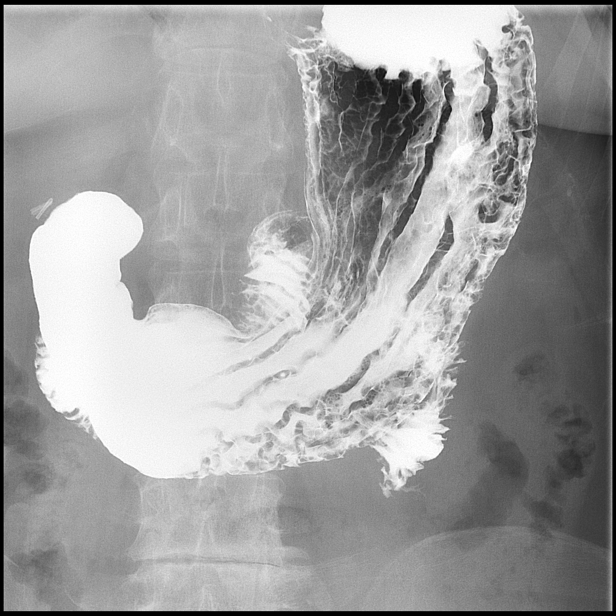

[Series 7: fluoro_barium singleshot_bw · 0.19mm/px · 1 of 1 slices shown (4 of 13)]
[im 1/1]
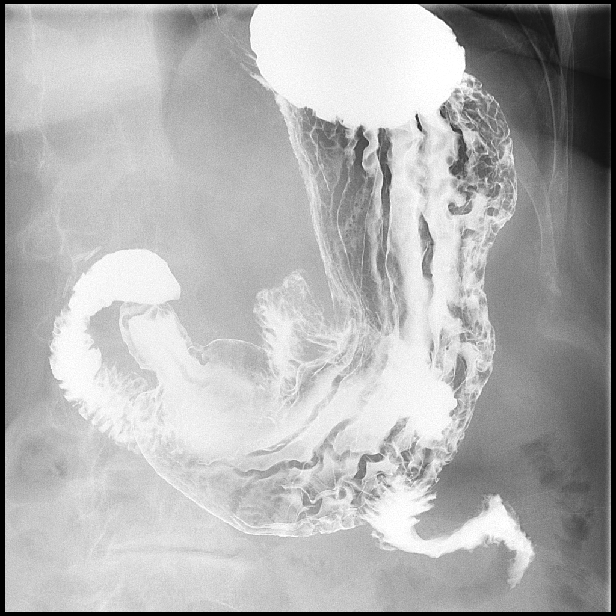

[Series 8: fluoro_barium singleshot_bw · 0.19mm/px · 1 of 1 slices shown (5 of 13)]
[im 1/1]
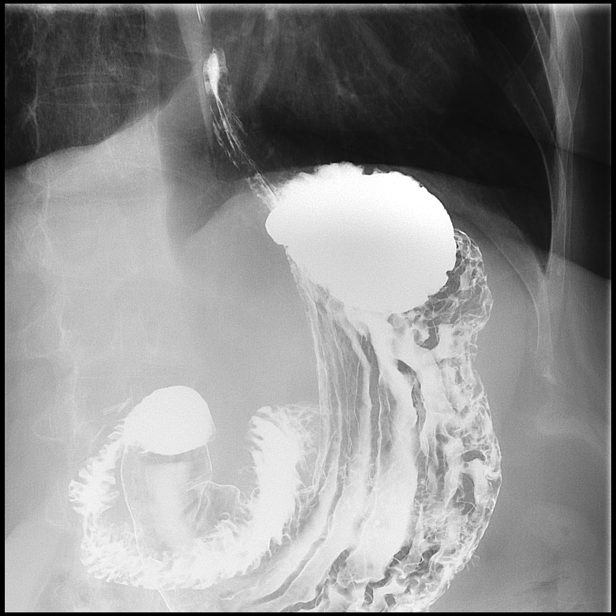

[Series 10: fluoro_barium singleshot_bw · 0.19mm/px · 1 of 1 slices shown (6 of 13)]
[im 1/1]
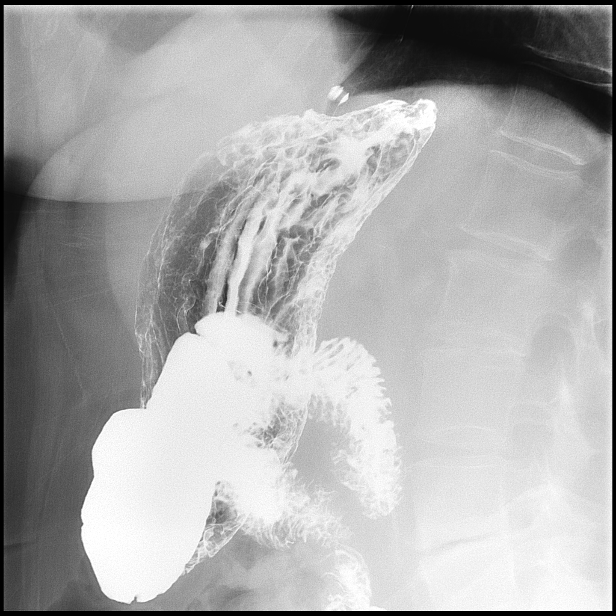

[Series 11: fluoro_barium singleshot_bw · 0.19mm/px · 1 of 1 slices shown (7 of 13)]
[im 1/1]
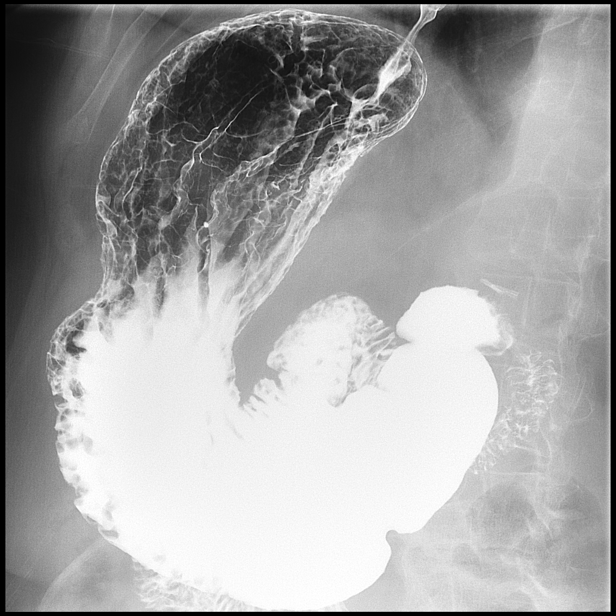

[Series 12: fluoro_barium singleshot_bw · 0.19mm/px · 1 of 1 slices shown (8 of 13)]
[im 1/1]
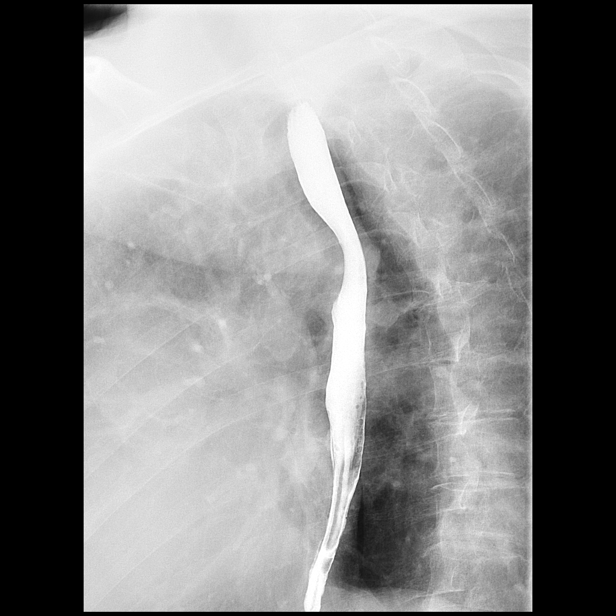

[Series 14: fluoro_barium singleshot_bw · 0.19mm/px · 1 of 1 slices shown (9 of 13)]
[im 1/1]
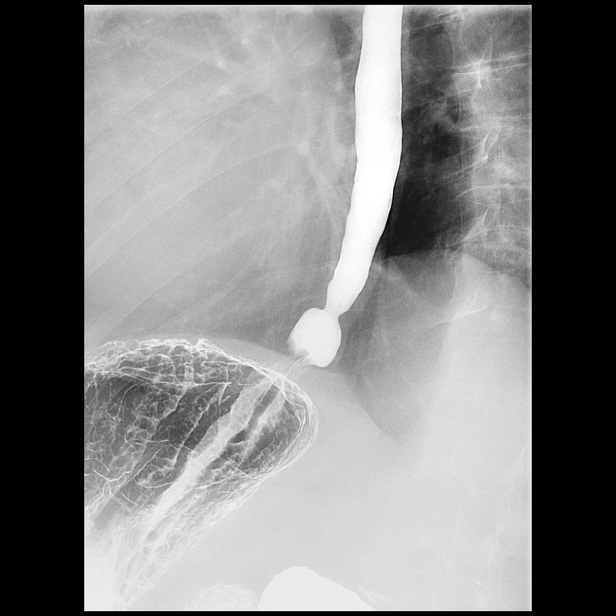

[Series 15: fluoro_barium singleshot_bw · 0.19mm/px · 1 of 1 slices shown (10 of 13)]
[im 1/1]
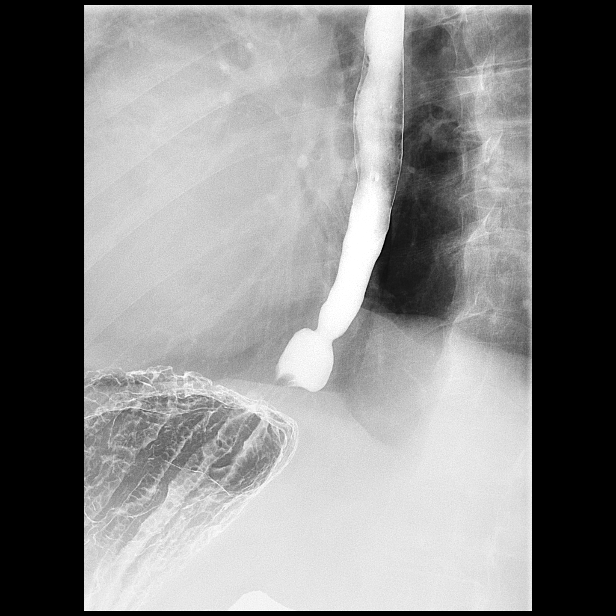

[Series 16: fluoro_barium singleshot_bw · 0.18mm/px · 1 of 1 slices shown (11 of 13)]
[im 1/1]
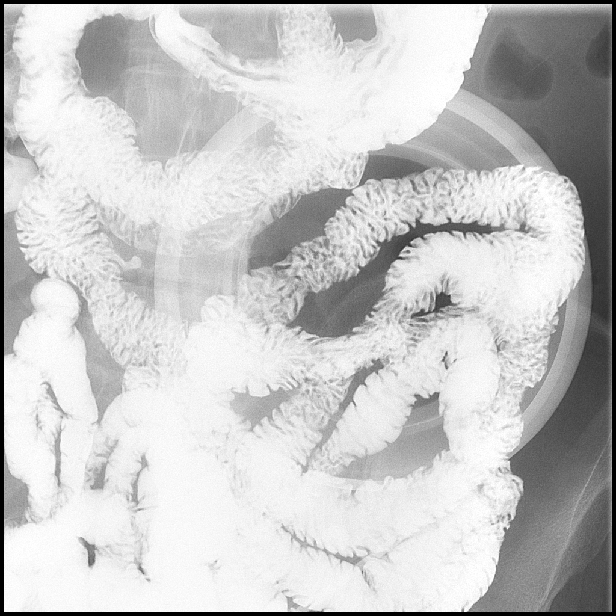

[Series 18: fluoro_barium singleshot_bw · 0.18mm/px · 1 of 1 slices shown (12 of 13)]
[im 1/1]
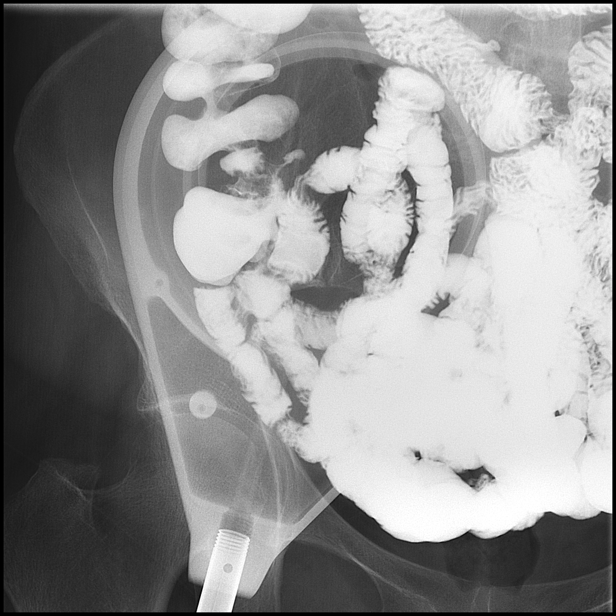

[Series 19: fluoro_barium singleshot_bw · 0.18mm/px · 1 of 1 slices shown (13 of 13)]
[im 1/1]
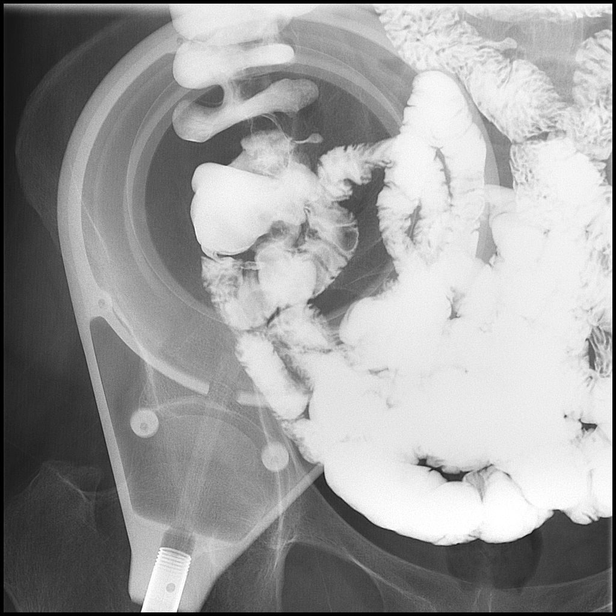

[15 of 20 positions shown; findings below may reference images not displayed]

FINDINGS: Surgical clips right upper quadrant. Pelvic calcifications
phleboliths. No bowel distention. Scout film is unremarkable.

Standard upper GI and small-bowel follow-through performed.
Esophagus is widely patent. No reflux noted. Stomach and duodenum
appear normal. No evidence of mass or ulceration. C-loop normal.
Small bowel appears normal. Small-bowel fold thickness and caliber
normal. Terminal ileum appears normal.
IMPRESSION: Normal upper GI and small-bowel follow-through.

## 2021-05-18 DIAGNOSIS — J449 Chronic obstructive pulmonary disease, unspecified: Secondary | ICD-10-CM | POA: Diagnosis not present

## 2021-05-18 DIAGNOSIS — I10 Essential (primary) hypertension: Secondary | ICD-10-CM | POA: Diagnosis not present

## 2021-05-27 DIAGNOSIS — H66001 Acute suppurative otitis media without spontaneous rupture of ear drum, right ear: Secondary | ICD-10-CM | POA: Diagnosis not present

## 2021-05-27 DIAGNOSIS — R42 Dizziness and giddiness: Secondary | ICD-10-CM | POA: Diagnosis not present

## 2021-06-16 ENCOUNTER — Telehealth: Payer: Self-pay

## 2021-06-16 ENCOUNTER — Other Ambulatory Visit: Payer: Self-pay | Admitting: Internal Medicine

## 2021-06-16 ENCOUNTER — Ambulatory Visit (INDEPENDENT_AMBULATORY_CARE_PROVIDER_SITE_OTHER): Payer: Medicare HMO | Admitting: Physician Assistant

## 2021-06-16 ENCOUNTER — Other Ambulatory Visit: Payer: Self-pay

## 2021-06-16 ENCOUNTER — Encounter: Payer: Self-pay | Admitting: Physician Assistant

## 2021-06-16 DIAGNOSIS — I10 Essential (primary) hypertension: Secondary | ICD-10-CM

## 2021-06-16 DIAGNOSIS — N1832 Chronic kidney disease, stage 3b: Secondary | ICD-10-CM | POA: Diagnosis not present

## 2021-06-16 DIAGNOSIS — R1013 Epigastric pain: Secondary | ICD-10-CM

## 2021-06-16 DIAGNOSIS — N261 Atrophy of kidney (terminal): Secondary | ICD-10-CM

## 2021-06-16 NOTE — Progress Notes (Signed)
[facility] [place], [place] [address]  Internal MEDICINE  Office Visit Note  Patient Name: Shannon Li  [id number]  [id number]  Date of Service: 06/16/2021  Chief Complaint  Patient presents with   Follow-up   Depression   Hypertension   Gastroesophageal Reflux    HPI Patient is here for routine follow-up -Pt had visit with ENT and states he found something on her ear drum and had to drill into eardrum.  She states she still unclear what this was however her ears have improved -Patient was unable to have CT angio of abdomen due to elevated creatinine. She has hx of elevated creatine due to renal atrophy and this has been chronic and stable, up until recently prior to procedure. Per pt, GI would like her to see nephrology but states she had not heard anything further about this. Will go ahead and refer today. In the meantime she will follow up with GI to discuss alternatives as she continues to have significant GI upset with some diarrhea and vomiting that limits her intake and epigastric pain. She has been losing weight due to this.  Current Medication: Outpatient Encounter Medications as of 06/16/2021  Medication Sig   amLODipine (NORVASC) 5 MG tablet Take 1 tablet (5 mg total) by mouth daily.   bisoprolol-hydrochlorothiazide (ZIAC) 2.5-6.25 MG tablet TAKE 1 TABLET BY MOUTH EVERY DAY FOR BLOOD PRESSURE   DULoxetine (CYMBALTA) 30 MG capsule TAKE 1 CAPSULE BY MOUTH EVERY DAY   famotidine (PEPCID) 40 MG tablet Take 40 mg by mouth daily.   fluticasone (FLONASE) 50 MCG/ACT nasal spray Place 2 sprays into both nostrils daily.   fluticasone furoate-vilanterol (BREO ELLIPTA) 100-25 MCG/ACT AEPB Inhale 1 puff into the lungs daily.   meclizine (ANTIVERT) 12.5 MG tablet One tab po tid prn for dizziness   pantoprazole (PROTONIX) 40 MG tablet TAKE 1 TABLET BY MOUTH TWICE A DAY   predniSONE (DELTASONE) 10 MG tablet Take 4 tablets by mouth with breakfast for 2 days,  2 tablets by mouth for 2 days and 1 tablet by mouth for 2 days.   rosuvastatin (CRESTOR) 5 MG tablet TAKE ONE TABLET BY MOUTH EVERY EVENING   sulfamethoxazole-trimethoprim (BACTRIM DS) 800-160 MG tablet Take 1 tablet by mouth 2 (two) times daily.   No facility-administered encounter medications on file as of 06/16/2021.    Surgical History: Past Surgical History:  Procedure Laterality Date   ABSCESS DRAINAGE  11/17/2013   buttock right   BREAST BIOPSY Right    neg   CATARACT EXTRACTION W/PHACO Right 08/17/2019   Procedure: CATARACT EXTRACTION PHACO AND INTRAOCULAR LENS PLACEMENT (IOC) RIGHT VISION BLUE 5.32  00:42.7;  Surgeon: Marchia Meiers, MD;  Location: [facility];  Service: Ophthalmology;  Laterality: Right;   CATARACT EXTRACTION W/PHACO Left 09/21/2019   Procedure: CATARACT EXTRACTION PHACO AND INTRAOCULAR LENS PLACEMENT ([place]) LEFT VISION BLUE;  Surgeon: Marchia Meiers, MD;  Location: [facility];  Service: Ophthalmology;  Laterality: Left;  6.24 0:40.6   CHOLECYSTECTOMY  2008   COLONOSCOPY  2014   [facility] [place] N/A 08/26/2015   Procedure: COLONOSCOPY WITH PROPOFOL;  Surgeon: Hulen Luster, MD;  Location: [facility] ENDOSCOPY;  Service: Gastroenterology;  Laterality: N/A;   COLONOSCOPY WITH PROPOFOL N/A 03/03/2021   Procedure: COLONOSCOPY WITH PROPOFOL;  Surgeon: Lesly Rubenstein, MD;  Location: [facility] ENDOSCOPY;  Service: Endoscopy;  Laterality: N/A;   DILATION AND CURETTAGE OF UTERUS  ESOPHAGOGASTRODUODENOSCOPY N/A 11/15/2014   Procedure: ESOPHAGOGASTRODUODENOSCOPY (EGD);  Surgeon: Hulen Luster, MD;  Location: [facility] ENDOSCOPY;  Service: Gastroenterology;  Laterality: N/A;   ESOPHAGOGASTRODUODENOSCOPY (EGD) WITH PROPOFOL N/A 03/03/2021   Procedure: ESOPHAGOGASTRODUODENOSCOPY (EGD) WITH PROPOFOL;  Surgeon: Lesly Rubenstein, MD;  Location: [facility] ENDOSCOPY;  Service: Endoscopy;  Laterality: N/A;   EYE SURGERY     GASTROSTOMY      UPPER GI ENDOSCOPY  2014   [facility] [place] History: Past Medical History:  Diagnosis Date   Anxiety    Arthritis    Atrophic kidney    Chronic constipation    Colon polyp 2013   COPD (chronic obstructive pulmonary disease) (HCC)    Depression    Diverticulosis    Esophageal stricture    Esophagitis    Fibrocystic breast disease    GERD (gastroesophageal reflux disease)    History of hiatal hernia    Hypertension    IBS (irritable bowel syndrome)    Menopause    Ovarian cyst    Perirectal abscess    Plantar fasciitis    Postmenopausal    Renal artery occlusion (HCC)    Renal insufficiency    Right kidney is small and not functioning.    Family History: Family History  Problem Relation Age of Onset   Heart disease Mother    Heart attack Mother    Diabetes Mother    Alzheimer's disease Father    Breast cancer Sister 44   Lung cancer Brother    Brain cancer Brother    Lung cancer Sister    Bladder Cancer Sister    Lung cancer Brother    Leukemia Brother    Diabetes Brother     Social History   Socioeconomic History   Marital status: Widowed    Spouse name: Not on file   Number of children: Not on file   Years of education: Not on file   Highest education level: Not on file  Occupational History   Not on file  Tobacco Use   Smoking status: Every Day    Packs/day: 0.50    Years: 30.00    Pack years: 15.00    Types: Cigarettes   Smokeless tobacco: Never   Tobacco comments:    1/2 Pack Daily  Vaping Use   Vaping Use: Never used  Substance and Sexual Activity   Alcohol use: No   Drug use: No   Sexual activity: Not on file  Other Topics Concern   Not on file  Social History Narrative   Not on file   Social Determinants of Health   Financial Resource Strain: Low Risk    Difficulty of Paying Living Expenses: Not very hard  Food Insecurity: Not on file  Transportation Needs: Not on file  Physical Activity: Not on file   Stress: Not on file  Social Connections: Not on file  Intimate Partner Violence: Not on file      Review of Systems  Constitutional:  Negative for chills, fatigue and unexpected weight change.  HENT:  Positive for postnasal drip. Negative for congestion, rhinorrhea, sneezing and sore throat.   Eyes:  Negative for redness.  Respiratory:  Negative for cough, chest tightness and shortness of breath.   Cardiovascular:  Negative for chest pain and palpitations.  Gastrointestinal:  Positive for abdominal pain and nausea. Negative for constipation and vomiting.  Genitourinary:  Negative for dysuria and frequency.  Musculoskeletal:  Negative for arthralgias, back pain, joint  swelling and neck pain.  Skin:  Negative for rash.  Neurological: Negative.  Negative for tremors and numbness.  Hematological:  Negative for adenopathy. Does not bruise/bleed easily.  Psychiatric/Behavioral:  Negative for behavioral problems (Depression), sleep disturbance and suicidal ideas. The patient is not nervous/anxious.    Vital Signs: BP 133/73    Pulse 73    Temp 98.1 F (36.7 C)    Resp 16    Ht 5\' 8"  (1.727 m)    Wt 159 lb 3.2 oz (72.2 kg)    SpO2 95%    BMI 24.21 kg/m    Physical Exam Vitals and nursing note reviewed.  Constitutional:      General: She is not in acute distress.    Appearance: She is well-developed. She is not diaphoretic.  HENT:     Head: Normocephalic and atraumatic.     Mouth/Throat:     Pharynx: No oropharyngeal exudate.  Eyes:     Pupils: Pupils are equal, round, and reactive to light.  Neck:     Thyroid: No thyromegaly.     Vascular: No JVD.     Trachea: No tracheal deviation.  Cardiovascular:     Rate and Rhythm: Normal rate and regular rhythm.     Heart sounds: Normal heart sounds. No murmur heard.   No friction rub. No gallop.  Pulmonary:     Effort: Pulmonary effort is normal. No respiratory distress.     Breath sounds: No wheezing or rales.  Chest:     Chest  wall: No tenderness.  Abdominal:     General: Bowel sounds are normal.     Palpations: Abdomen is soft.  Musculoskeletal:        General: Normal range of motion.     Cervical back: Normal range of motion and neck supple.  Lymphadenopathy:     Cervical: No cervical adenopathy.  Skin:    General: Skin is warm and dry.  Neurological:     Mental Status: She is alert and oriented to person, place, and time.     Cranial Nerves: No cranial nerve deficit.  Psychiatric:        Behavior: Behavior normal.        Thought Content: Thought content normal.        Judgment: Judgment normal.       Assessment/Plan: 1. Essential hypertension Stable, continue current medication  2. Renal atrophy Renal atrophy with chronically stable labs up until recently.  Will refer to nephrology for further evaluation and management - Ambulatory referral to Nephrology  3. Stage 3b chronic kidney disease ([place]) Labs previously stable up until recently with worsening creatinine and GFR.  Will refer to nephrology for further evaluation and management especially given patient was supposed to have CT angio of abdomen and was unable to do so due to high creatinine levels - Ambulatory referral to Nephrology  4. Epigastric pain Will follow-up with GI to discuss alternative plans if CT angio was unable to be performed and to notify them of plan to see nephrology to consult about whether testing can be done or not   General Counseling: Truitt Leep understanding of the findings of todays visit and agrees with plan of treatment. I have discussed any further diagnostic evaluation that may be needed or ordered today. We also reviewed her medications today. she has been encouraged to call the office with any questions or concerns that should arise related to todays visit.    Orders Placed This Encounter  Procedures  Ambulatory referral to Nephrology    No orders of the defined types were placed in this  encounter.   This patient was seen by Drema Dallas, PA-C in collaboration with Dr. Clayborn Bigness as a part of collaborative care agreement.   Total time spent:30 Minutes Time spent includes review of chart, medications, test results, and follow up plan with the patient.      Dr Lavera Guise Internal medicine

## 2021-06-16 NOTE — Telephone Encounter (Signed)
Awaiting 06/16/21 office notes for nephrology referral-Toni

## 2021-06-17 DIAGNOSIS — L820 Inflamed seborrheic keratosis: Secondary | ICD-10-CM | POA: Diagnosis not present

## 2021-06-17 DIAGNOSIS — D485 Neoplasm of uncertain behavior of skin: Secondary | ICD-10-CM | POA: Diagnosis not present

## 2021-06-17 DIAGNOSIS — C44722 Squamous cell carcinoma of skin of right lower limb, including hip: Secondary | ICD-10-CM | POA: Diagnosis not present

## 2021-06-17 DIAGNOSIS — L905 Scar conditions and fibrosis of skin: Secondary | ICD-10-CM | POA: Diagnosis not present

## 2021-06-18 DIAGNOSIS — K219 Gastro-esophageal reflux disease without esophagitis: Secondary | ICD-10-CM | POA: Diagnosis not present

## 2021-06-18 DIAGNOSIS — Z008 Encounter for other general examination: Secondary | ICD-10-CM | POA: Diagnosis not present

## 2021-06-18 DIAGNOSIS — Z720 Tobacco use: Secondary | ICD-10-CM | POA: Diagnosis not present

## 2021-06-18 DIAGNOSIS — Z833 Family history of diabetes mellitus: Secondary | ICD-10-CM | POA: Diagnosis not present

## 2021-06-18 DIAGNOSIS — Z880 Allergy status to penicillin: Secondary | ICD-10-CM | POA: Diagnosis not present

## 2021-06-18 DIAGNOSIS — I10 Essential (primary) hypertension: Secondary | ICD-10-CM | POA: Diagnosis not present

## 2021-06-18 DIAGNOSIS — Z85828 Personal history of other malignant neoplasm of skin: Secondary | ICD-10-CM | POA: Diagnosis not present

## 2021-06-18 DIAGNOSIS — Z8249 Family history of ischemic heart disease and other diseases of the circulatory system: Secondary | ICD-10-CM | POA: Diagnosis not present

## 2021-06-18 DIAGNOSIS — R69 Illness, unspecified: Secondary | ICD-10-CM | POA: Diagnosis not present

## 2021-06-18 DIAGNOSIS — Z803 Family history of malignant neoplasm of breast: Secondary | ICD-10-CM | POA: Diagnosis not present

## 2021-06-18 NOTE — Telephone Encounter (Signed)
Sent via proficient to [place] ?

## 2021-06-20 ENCOUNTER — Telehealth: Payer: Medicare HMO

## 2021-06-25 ENCOUNTER — Other Ambulatory Visit: Payer: Self-pay | Admitting: Nephrology

## 2021-06-25 DIAGNOSIS — N261 Atrophy of kidney (terminal): Secondary | ICD-10-CM | POA: Diagnosis not present

## 2021-06-25 DIAGNOSIS — J449 Chronic obstructive pulmonary disease, unspecified: Secondary | ICD-10-CM | POA: Diagnosis not present

## 2021-06-25 DIAGNOSIS — E7800 Pure hypercholesterolemia, unspecified: Secondary | ICD-10-CM

## 2021-06-25 DIAGNOSIS — I10 Essential (primary) hypertension: Secondary | ICD-10-CM | POA: Diagnosis not present

## 2021-06-25 DIAGNOSIS — N270 Small kidney, unilateral: Secondary | ICD-10-CM | POA: Diagnosis not present

## 2021-06-25 DIAGNOSIS — N1832 Chronic kidney disease, stage 3b: Secondary | ICD-10-CM | POA: Diagnosis not present

## 2021-06-25 DIAGNOSIS — R69 Illness, unspecified: Secondary | ICD-10-CM | POA: Diagnosis not present

## 2021-06-25 DIAGNOSIS — F419 Anxiety disorder, unspecified: Secondary | ICD-10-CM | POA: Diagnosis not present

## 2021-06-25 DIAGNOSIS — K219 Gastro-esophageal reflux disease without esophagitis: Secondary | ICD-10-CM | POA: Diagnosis not present

## 2021-07-03 ENCOUNTER — Telehealth: Payer: Self-pay | Admitting: Student-PharmD

## 2021-07-03 NOTE — Progress Notes (Addendum)
Hypertension (HTN) Review Call ? ?Long,Shannon Li ?36 years, Female  DOB: Jul 31, 1950  M: [phone] ? ?Hypertension Review (HC) ?Completed by [name] on 07/03/2021 ? ?Chart Review ?Is the patient enrolled in RPM with BP Monitor?: No ? ?BP #1 reading (last): 126/80 ?on: 06/25/2021 ? ?BP #2 reading: 133/73 ?on: 06/16/2021 ? ?BP #3 reading: 138/72 ?on: 05/05/2021 ? ?Any of the last 3 BP > 140/90 mmHg?: No ? ?What recent interventions have been made by any provider to improve the patient's conditions in the last 3 months?: Consults: ?05/13/21 Otolaryngology Shannon Li. For Acute suppurative otitis media without spontaneous rupture of ear drum, right ear. No other information given. ?06/25/21 Nephrology Shannon Son, MD. For Atrophy of kidney. No medication changes. ? ?Office Visit: ?06/16/21 Shannon Latina, PA-C. For Essential hypertension. No medication changes. ? ?Any recent hospitalizations or ED visits since last visit with CPP?: No ? ?Adherence Review ?Adherence rates for STAR metric medications: Rosuvastatin 5 mg 10/11/20 90 DS ? ?Adherence rates for medications indicated for disease state being reviewed: None. ? ?Does the patient have >5 day gap between last estimated fill dates for any of the above medications?: Yes ? ?Reasons for medication gaps: Rosuvastatin 5 mg 10/11/20 90 DS - Patient stated she doesn't take this medication. ? ?Disease State Questions ?Able to connect with the Patient?: Yes ? ?Is the patient monitoring his/her BP?: Yes ? ?How often are you checking your BP?: occasionally ? ?Home BP Reading #1 (most recent): 137/84 ? ?Is the patient having any low BP Readings <90/60?: No ? ?Is the patient having any BP readings above >180/100?: No ? ?Is the patient's average BP>140/90?: No ? ?What is your blood pressure goal?: 120/80 ?Educate patient to inform proper points on checking BP at home:: Do not drink caffeine or smoke a cigarette at least 30 min. prior to  checking., Sit with feet flat on the floor, arm at heart level., When taking resting blood pressure: sit quietly for 5 minutes, not within 30 min. of exercising, no talking. ? ?What diet changes have you made to improve your Blood Pressure Control?: eating more home-cooked meals ? ?What exercise are you doing to improve your Blood Pressure Control?: no formal exercise ? ?Misc. Response/Information:: Patient stated she lost weight with her stomach issue. She stated her stomach concern is still up in the air. ? ?Engagement Notes ?HC Chart Review: 13 min 07/03/21  ?[facility] Assessment call time spent: 10 min 07/03/21 ? ?[name], RMA ?Healthcare Concierge  ?[phone] ? ?Pharmacist Review ?Adherence gaps identified?: No ?Drug Therapy Problems identified?: No ?Assessment: Controlled ? ?Shannon Li ?Clinical Pharmacist ? ?

## 2021-07-16 ENCOUNTER — Ambulatory Visit
Admission: RE | Admit: 2021-07-16 | Discharge: 2021-07-16 | Disposition: A | Discharge status: Home / Self Care | Payer: Medicare HMO | Source: Ambulatory Visit | Attending: Nephrology | Admitting: Nephrology

## 2021-07-16 DIAGNOSIS — N270 Small kidney, unilateral: Secondary | ICD-10-CM | POA: Diagnosis present

## 2021-07-16 DIAGNOSIS — N261 Atrophy of kidney (terminal): Secondary | ICD-10-CM | POA: Diagnosis present

## 2021-07-16 DIAGNOSIS — E7800 Pure hypercholesterolemia, unspecified: Secondary | ICD-10-CM | POA: Insufficient documentation

## 2021-07-23 DIAGNOSIS — N261 Atrophy of kidney (terminal): Secondary | ICD-10-CM | POA: Diagnosis not present

## 2021-07-23 DIAGNOSIS — R69 Illness, unspecified: Secondary | ICD-10-CM | POA: Diagnosis not present

## 2021-07-23 DIAGNOSIS — N270 Small kidney, unilateral: Secondary | ICD-10-CM | POA: Diagnosis not present

## 2021-07-23 DIAGNOSIS — J449 Chronic obstructive pulmonary disease, unspecified: Secondary | ICD-10-CM | POA: Diagnosis not present

## 2021-07-23 DIAGNOSIS — K219 Gastro-esophageal reflux disease without esophagitis: Secondary | ICD-10-CM | POA: Diagnosis not present

## 2021-07-23 DIAGNOSIS — I10 Essential (primary) hypertension: Secondary | ICD-10-CM | POA: Diagnosis not present

## 2021-07-23 DIAGNOSIS — E7800 Pure hypercholesterolemia, unspecified: Secondary | ICD-10-CM | POA: Diagnosis not present

## 2021-07-23 DIAGNOSIS — N1832 Chronic kidney disease, stage 3b: Secondary | ICD-10-CM | POA: Diagnosis not present

## 2021-08-12 DIAGNOSIS — R69 Illness, unspecified: Secondary | ICD-10-CM | POA: Diagnosis not present

## 2021-08-12 DIAGNOSIS — K219 Gastro-esophageal reflux disease without esophagitis: Secondary | ICD-10-CM | POA: Diagnosis not present

## 2021-08-12 DIAGNOSIS — E7800 Pure hypercholesterolemia, unspecified: Secondary | ICD-10-CM | POA: Diagnosis not present

## 2021-08-12 DIAGNOSIS — N1832 Chronic kidney disease, stage 3b: Secondary | ICD-10-CM | POA: Diagnosis not present

## 2021-08-12 DIAGNOSIS — N270 Small kidney, unilateral: Secondary | ICD-10-CM | POA: Diagnosis not present

## 2021-08-12 DIAGNOSIS — F419 Anxiety disorder, unspecified: Secondary | ICD-10-CM | POA: Diagnosis not present

## 2021-08-12 DIAGNOSIS — J449 Chronic obstructive pulmonary disease, unspecified: Secondary | ICD-10-CM | POA: Diagnosis not present

## 2021-08-12 DIAGNOSIS — N261 Atrophy of kidney (terminal): Secondary | ICD-10-CM | POA: Diagnosis not present

## 2021-08-12 DIAGNOSIS — I10 Essential (primary) hypertension: Secondary | ICD-10-CM | POA: Diagnosis not present

## 2021-08-20 DIAGNOSIS — N270 Small kidney, unilateral: Secondary | ICD-10-CM | POA: Diagnosis not present

## 2021-08-20 DIAGNOSIS — I10 Essential (primary) hypertension: Secondary | ICD-10-CM | POA: Diagnosis not present

## 2021-08-20 DIAGNOSIS — N261 Atrophy of kidney (terminal): Secondary | ICD-10-CM | POA: Diagnosis not present

## 2021-08-20 DIAGNOSIS — J449 Chronic obstructive pulmonary disease, unspecified: Secondary | ICD-10-CM | POA: Diagnosis not present

## 2021-08-20 DIAGNOSIS — R69 Illness, unspecified: Secondary | ICD-10-CM | POA: Diagnosis not present

## 2021-08-20 DIAGNOSIS — K219 Gastro-esophageal reflux disease without esophagitis: Secondary | ICD-10-CM | POA: Diagnosis not present

## 2021-08-20 DIAGNOSIS — E7800 Pure hypercholesterolemia, unspecified: Secondary | ICD-10-CM | POA: Diagnosis not present

## 2021-08-20 DIAGNOSIS — N1832 Chronic kidney disease, stage 3b: Secondary | ICD-10-CM | POA: Diagnosis not present

## 2021-08-24 ENCOUNTER — Other Ambulatory Visit: Payer: Self-pay | Admitting: Internal Medicine

## 2021-08-24 DIAGNOSIS — K219 Gastro-esophageal reflux disease without esophagitis: Secondary | ICD-10-CM

## 2021-08-28 ENCOUNTER — Other Ambulatory Visit: Payer: Self-pay | Admitting: Nurse Practitioner

## 2021-08-28 DIAGNOSIS — I10 Essential (primary) hypertension: Secondary | ICD-10-CM

## 2021-09-02 ENCOUNTER — Other Ambulatory Visit: Payer: Self-pay | Admitting: Internal Medicine

## 2021-09-02 DIAGNOSIS — F411 Generalized anxiety disorder: Secondary | ICD-10-CM

## 2021-09-03 DIAGNOSIS — R6889 Other general symptoms and signs: Secondary | ICD-10-CM | POA: Diagnosis not present

## 2021-09-11 ENCOUNTER — Ambulatory Visit (INDEPENDENT_AMBULATORY_CARE_PROVIDER_SITE_OTHER): Payer: Medicare HMO | Admitting: Physician Assistant

## 2021-09-11 ENCOUNTER — Encounter: Payer: Self-pay | Admitting: Physician Assistant

## 2021-09-11 ENCOUNTER — Telehealth: Payer: Self-pay

## 2021-09-11 VITALS — BP 128/71 | HR 59 | Temp 97.8°F | Resp 16 | Ht 68.0 in | Wt 155.0 lb

## 2021-09-11 DIAGNOSIS — J432 Centrilobular emphysema: Secondary | ICD-10-CM

## 2021-09-11 DIAGNOSIS — N1832 Chronic kidney disease, stage 3b: Secondary | ICD-10-CM

## 2021-09-11 DIAGNOSIS — R0602 Shortness of breath: Secondary | ICD-10-CM

## 2021-09-11 DIAGNOSIS — R5383 Other fatigue: Secondary | ICD-10-CM | POA: Diagnosis not present

## 2021-09-11 DIAGNOSIS — M79605 Pain in left leg: Secondary | ICD-10-CM

## 2021-09-11 DIAGNOSIS — E559 Vitamin D deficiency, unspecified: Secondary | ICD-10-CM | POA: Diagnosis not present

## 2021-09-11 DIAGNOSIS — R7989 Other specified abnormal findings of blood chemistry: Secondary | ICD-10-CM

## 2021-09-11 DIAGNOSIS — M79604 Pain in right leg: Secondary | ICD-10-CM | POA: Diagnosis not present

## 2021-09-11 DIAGNOSIS — E782 Mixed hyperlipidemia: Secondary | ICD-10-CM

## 2021-09-11 DIAGNOSIS — F17219 Nicotine dependence, cigarettes, with unspecified nicotine-induced disorders: Secondary | ICD-10-CM | POA: Diagnosis not present

## 2021-09-11 DIAGNOSIS — R1013 Epigastric pain: Secondary | ICD-10-CM

## 2021-09-11 DIAGNOSIS — I10 Essential (primary) hypertension: Secondary | ICD-10-CM

## 2021-09-11 DIAGNOSIS — E538 Deficiency of other specified B group vitamins: Secondary | ICD-10-CM

## 2021-09-11 DIAGNOSIS — R69 Illness, unspecified: Secondary | ICD-10-CM | POA: Diagnosis not present

## 2021-09-11 NOTE — Progress Notes (Signed)
[facility] [place], [place] [address]  Internal MEDICINE  Office Visit Note  Patient Name: Shannon Li  [id number]  [id number]  Date of Service: 09/17/2021  Chief Complaint  Patient presents with   Follow-up    Memory Dance is expensive    Anxiety   Hypertension   Left leg     And foot swelling     HPI Pt is here for routine follow up -States breo is expensive and wants to try alternative. She is going to call her insurance and let us know which inhaler is preferred -Seeing nephrology now due to hx of atrophic kidney and CKD that had been stable until recently declining. -Had colonoscopy and endoscopy with GI -Dr. Haig Prophet wants to check CT with contrast, but creatinine high and nephrology didn't want to do that unless absolutely possibl and use least amount of contrast possible. -He instead is ordering an alterative test to check for bacteria. Has had one more episode with nausea and vomiting. She is losing weight still due to these episodes -Dr. Haig Prophet is also sending her to cardiology for further evaluation -left side ankle swelling that fluctuates and will check ABI in office -smoking less than 1/2 ppd, due for repeat CT chest -Due for routine fasting labs prior to CPE  Current Medication: Outpatient Encounter Medications as of 09/11/2021  Medication Sig   amLODipine (NORVASC) 5 MG tablet TAKE 1 TABLET (5 MG TOTAL) BY MOUTH DAILY.   bisoprolol-hydrochlorothiazide (ZIAC) 2.5-6.25 MG tablet TAKE 1 TABLET BY MOUTH EVERY DAY FOR BLOOD PRESSURE   DULoxetine (CYMBALTA) 30 MG capsule TAKE ONE CAPSULE BY MOUTH DAILY   famotidine (PEPCID) 40 MG tablet Take 40 mg by mouth daily.   fluticasone (FLONASE) 50 MCG/ACT nasal spray Place 2 sprays into both nostrils daily.   meclizine (ANTIVERT) 12.5 MG tablet One tab po tid prn for dizziness   pantoprazole (PROTONIX) 40 MG tablet TAKE 1 TABLET BY MOUTH TWICE A DAY   rosuvastatin (CRESTOR) 5 MG tablet TAKE ONE  TABLET BY MOUTH EVERY EVENING   [DISCONTINUED] fluticasone furoate-vilanterol (BREO ELLIPTA) 100-25 MCG/ACT AEPB Inhale 1 puff into the lungs daily.   [DISCONTINUED] predniSONE (DELTASONE) 10 MG tablet Take 4 tablets by mouth with breakfast for 2 days, 2 tablets by mouth for 2 days and 1 tablet by mouth for 2 days.   [DISCONTINUED] sulfamethoxazole-trimethoprim (BACTRIM DS) 800-160 MG tablet Take 1 tablet by mouth 2 (two) times daily.   No facility-administered encounter medications on file as of 09/11/2021.    Surgical History: Past Surgical History:  Procedure Laterality Date   ABSCESS DRAINAGE  11/17/2013   buttock right   BREAST BIOPSY Right    neg   CATARACT EXTRACTION W/PHACO Right 08/17/2019   Procedure: CATARACT EXTRACTION PHACO AND INTRAOCULAR LENS PLACEMENT (IOC) RIGHT VISION BLUE 5.32  00:42.7;  Surgeon: Marchia Meiers, MD;  Location: [facility];  Service: Ophthalmology;  Laterality: Right;   CATARACT EXTRACTION W/PHACO Left 09/21/2019   Procedure: CATARACT EXTRACTION PHACO AND INTRAOCULAR LENS PLACEMENT ([place]) LEFT VISION BLUE;  Surgeon: Marchia Meiers, MD;  Location: [facility];  Service: Ophthalmology;  Laterality: Left;  6.24 0:40.6   CHOLECYSTECTOMY  2008   COLONOSCOPY  2014   [facility] [place] N/A 08/26/2015   Procedure: COLONOSCOPY WITH PROPOFOL;  Surgeon: Hulen Luster, MD;  Location: [facility] ENDOSCOPY;  Service: Gastroenterology;  Laterality: N/A;   COLONOSCOPY WITH PROPOFOL N/A 03/03/2021   Procedure: COLONOSCOPY  WITH PROPOFOL;  Surgeon: Lesly Rubenstein, MD;  Location[facility] ENDOSCOPY;  Service: Endoscopy;  Laterality: N/A;   DILATION AND CURETTAGE OF UTERUS     ESOPHAGOGASTRODUODENOSCOPY N/A 11/15/2014   Procedure: ESOPHAGOGASTRODUODENOSCOPY (EGD);  Surgeon: Hulen Luster, MD;  Location: [facility] ENDOSCOPY;  Service: Gastroenterology;  Laterality: N/A;   ESOPHAGOGASTRODUODENOSCOPY (EGD) WITH PROPOFOL N/A 03/03/2021    Procedure: ESOPHAGOGASTRODUODENOSCOPY (EGD) WITH PROPOFOL;  Surgeon: Lesly Rubenstein, MD;  Location: [facility] ENDOSCOPY;  Service: Endoscopy;  Laterality: N/A;   EYE SURGERY     GASTROSTOMY     UPPER GI ENDOSCOPY  2014   [facility] [place] History: Past Medical History:  Diagnosis Date   Anxiety    Arthritis    Atrophic kidney    Chronic constipation    Colon polyp 2013   COPD (chronic obstructive pulmonary disease) (HCC)    Depression    Diverticulosis    Esophageal stricture    Esophagitis    Fibrocystic breast disease    GERD (gastroesophageal reflux disease)    History of hiatal hernia    Hypertension    IBS (irritable bowel syndrome)    Menopause    Ovarian cyst    Perirectal abscess    Plantar fasciitis    Postmenopausal    Renal artery occlusion (HCC)    Renal insufficiency    Right kidney is small and not functioning.    Family History: Family History  Problem Relation Age of Onset   Heart disease Mother    Heart attack Mother    Diabetes Mother    Alzheimer's disease Father    Breast cancer Sister 47   Lung cancer Brother    Brain cancer Brother    Lung cancer Sister    Bladder Cancer Sister    Lung cancer Brother    Leukemia Brother    Diabetes Brother     Social History   Socioeconomic History   Marital status: Widowed    Spouse name: Not on file   Number of children: Not on file   Years of education: Not on file   Highest education level: Not on file  Occupational History   Not on file  Tobacco Use   Smoking status: Every Day    Packs/day: 0.50    Years: 30.00    Pack years: 15.00    Types: Cigarettes   Smokeless tobacco: Never   Tobacco comments:    1/2 Pack Daily  Vaping Use   Vaping Use: Never used  Substance and Sexual Activity   Alcohol use: No   Drug use: No   Sexual activity: Not on file  Other Topics Concern   Not on file  Social History Narrative   Not on file   Social Determinants of Health    Financial Resource Strain: Low Risk    Difficulty of Paying Living Expenses: Not very hard  Food Insecurity: Not on file  Transportation Needs: Not on file  Physical Activity: Not on file  Stress: Not on file  Social Connections: Not on file  Intimate Partner Violence: Not on file      Review of Systems  Constitutional:  Negative for chills, fatigue and unexpected weight change.  HENT:  Negative for congestion, postnasal drip, rhinorrhea, sneezing and sore throat.   Eyes:  Negative for redness.  Respiratory:  Negative for cough, chest tightness and shortness of breath.   Cardiovascular:  Negative for chest pain and palpitations.  Gastrointestinal:  Positive  for abdominal pain and nausea. Negative for constipation and vomiting.  Genitourinary:  Negative for dysuria and frequency.  Musculoskeletal:  Negative for arthralgias, back pain, joint swelling and neck pain.  Skin:  Negative for rash.  Neurological: Negative.  Negative for tremors and numbness.  Hematological:  Negative for adenopathy. Does not bruise/bleed easily.  Psychiatric/Behavioral:  Negative for behavioral problems (Depression), sleep disturbance and suicidal ideas. The patient is not nervous/anxious.    Vital Signs: BP 128/71   Pulse (!) 59   Temp 97.8 F (36.6 C)   Resp 16   Ht '5\' 8"'$  (1.727 m)   Wt 155 lb (70.3 kg)   SpO2 95%   BMI 23.57 kg/m    Physical Exam Vitals and nursing note reviewed.  Constitutional:      General: She is not in acute distress.    Appearance: She is well-developed. She is not diaphoretic.  HENT:     Head: Normocephalic and atraumatic.     Mouth/Throat:     Pharynx: No oropharyngeal exudate.  Eyes:     Pupils: Pupils are equal, round, and reactive to light.  Neck:     Thyroid: No thyromegaly.     Vascular: No JVD.     Trachea: No tracheal deviation.  Cardiovascular:     Rate and Rhythm: Normal rate and regular rhythm.     Heart sounds: Normal heart sounds. No murmur  heard.   No friction rub. No gallop.  Pulmonary:     Effort: Pulmonary effort is normal. No respiratory distress.     Breath sounds: No wheezing or rales.  Chest:     Chest wall: No tenderness.  Abdominal:     General: Bowel sounds are normal.     Palpations: Abdomen is soft.  Musculoskeletal:        General: Normal range of motion.     Cervical back: Normal range of motion and neck supple.     Right lower leg: No edema.     Left lower leg: Edema present.  Lymphadenopathy:     Cervical: No cervical adenopathy.  Skin:    General: Skin is warm and dry.  Neurological:     Mental Status: She is alert and oriented to person, place, and time.     Cranial Nerves: No cranial nerve deficit.  Psychiatric:        Behavior: Behavior normal.        Thought Content: Thought content normal.        Judgment: Judgment normal.       Assessment/Plan: 1. Essential hypertension Stable, continue current medication  2. Centrilobular emphysema ([place]) Patient will call back with preferred inhaler after speaking with insurance.  Due for annual CT chest - CT CHEST LUNG CA SCREEN LOW DOSE W/O CM; Future  3. Stage 3b chronic kidney disease ([place]) Followed by nephrology - Comprehensive metabolic panel - CBC w/Diff/Platelet  4. Cigarette nicotine dependence with nicotine-induced disorder Continues to work on smoking cessation and is due for repeat CT of the chest - CT CHEST LUNG CA SCREEN LOW DOSE W/O CM; Future  5. SOB (shortness of breath) - CT CHEST LUNG CA SCREEN LOW DOSE W/O CM; Future  6. Epigastric pain Followed by GI  7. Lower extremity pain, bilateral - POCT ABI Screening Pilot No Charge -- found to be normal and will continue to monitor, advised to elevate legs and try compression stockings to help with any lower extremity swelling. Pt will be seeing cardiology as well  8. Mixed hyperlipidemia - Lipid Panel With LDL/HDL Ratio  9. Vitamin D deficiency - VITAMIN D 25 Hydroxy  (Vit-D Deficiency, Fractures)  10. B12 deficiency - B12 and Folate Panel  11. Abnormal thyroid blood test - TSH + free T4  12. Other fatigue - Fe+TIBC+Fer   General Counseling: manreet kiernan understanding of the findings of todays visit and agrees with plan of treatment. I have discussed any further diagnostic evaluation that may be needed or ordered today. We also reviewed her medications today. she has been encouraged to call the office with any questions or concerns that should arise related to todays visit.    Orders Placed This Encounter  Procedures   CT CHEST LUNG CA SCREEN LOW DOSE W/O CM   VITAMIN D 25 Hydroxy (Vit-D Deficiency, Fractures)   TSH + free T4   Comprehensive metabolic panel   CBC w/Diff/Platelet   Lipid Panel With LDL/HDL Ratio   B12 and Folate Panel   Fe+TIBC+Fer   POCT ABI Screening Pilot No Charge    No orders of the defined types were placed in this encounter.   This patient was seen by Drema Dallas, PA-C in collaboration with Dr. Clayborn Bigness as a part of collaborative care agreement.   Total time spent:40 Minutes Time spent includes review of chart, medications, test results, and follow up plan with the patient.      Dr Lavera Guise Internal medicine

## 2021-09-11 NOTE — Telephone Encounter (Signed)
Lung CA CT ordered. Printed. Gave to [employer]

## 2021-09-15 DIAGNOSIS — K580 Irritable bowel syndrome with diarrhea: Secondary | ICD-10-CM | POA: Diagnosis not present

## 2021-09-15 DIAGNOSIS — R109 Unspecified abdominal pain: Secondary | ICD-10-CM | POA: Diagnosis not present

## 2021-09-16 ENCOUNTER — Telehealth: Payer: Self-pay

## 2021-09-16 NOTE — Telephone Encounter (Signed)
Patient scheduled for ct chest on 10/06/21 @ 10:00 kp/tat

## 2021-09-23 DIAGNOSIS — E7800 Pure hypercholesterolemia, unspecified: Secondary | ICD-10-CM | POA: Diagnosis not present

## 2021-09-23 DIAGNOSIS — I10 Essential (primary) hypertension: Secondary | ICD-10-CM | POA: Diagnosis not present

## 2021-09-23 DIAGNOSIS — R5382 Chronic fatigue, unspecified: Secondary | ICD-10-CM | POA: Diagnosis not present

## 2021-09-23 DIAGNOSIS — R0602 Shortness of breath: Secondary | ICD-10-CM | POA: Diagnosis not present

## 2021-09-29 DIAGNOSIS — E782 Mixed hyperlipidemia: Secondary | ICD-10-CM | POA: Diagnosis not present

## 2021-09-29 DIAGNOSIS — R7989 Other specified abnormal findings of blood chemistry: Secondary | ICD-10-CM | POA: Diagnosis not present

## 2021-09-29 DIAGNOSIS — E559 Vitamin D deficiency, unspecified: Secondary | ICD-10-CM | POA: Diagnosis not present

## 2021-09-29 DIAGNOSIS — E538 Deficiency of other specified B group vitamins: Secondary | ICD-10-CM | POA: Diagnosis not present

## 2021-09-29 DIAGNOSIS — N1832 Chronic kidney disease, stage 3b: Secondary | ICD-10-CM | POA: Diagnosis not present

## 2021-09-29 DIAGNOSIS — R5383 Other fatigue: Secondary | ICD-10-CM | POA: Diagnosis not present

## 2021-09-30 LAB — COMPREHENSIVE METABOLIC PANEL WITH GFR
ALT: 15 [IU]/L (ref 0–32)
AST: 21 [IU]/L (ref 0–40)
Albumin/Globulin Ratio: 1.8 (ref 1.2–2.2)
Albumin: 4.5 g/dL (ref 3.7–4.7)
Alkaline Phosphatase: 81 [IU]/L (ref 44–121)
BUN/Creatinine Ratio: 12 (ref 12–28)
BUN: 25 mg/dL (ref 8–27)
Bilirubin Total: <0.2 mg/dL (ref 0.0–1.2)
CO2: 22 mmol/L (ref 20–29)
Calcium: 9.5 mg/dL (ref 8.7–10.3)
Chloride: 98 mmol/L (ref 96–106)
Creatinine, Ser: 2.15 mg/dL — ABNORMAL HIGH (ref 0.57–1.00)
Globulin, Total: 2.5 g/dL (ref 1.5–4.5)
Glucose: 96 mg/dL (ref 70–99)
Potassium: 4.4 mmol/L (ref 3.5–5.2)
Sodium: 137 mmol/L (ref 134–144)
Total Protein: 7 g/dL (ref 6.0–8.5)
eGFR: 24 mL/min/{1.73_m2} — ABNORMAL LOW

## 2021-09-30 LAB — CBC WITH DIFFERENTIAL/PLATELET
Basophils Absolute: 0.1 10*3/uL (ref 0.0–0.2)
Basos: 1 %
EOS (ABSOLUTE): 0.3 10*3/uL (ref 0.0–0.4)
Eos: 4 %
Hematocrit: 44.2 % (ref 34.0–46.6)
Hemoglobin: 14.7 g/dL (ref 11.1–15.9)
Immature Grans (Abs): 0 10*3/uL (ref 0.0–0.1)
Immature Granulocytes: 0 %
Lymphocytes Absolute: 2.2 10*3/uL (ref 0.7–3.1)
Lymphs: 27 %
MCH: 30.1 pg (ref 26.6–33.0)
MCHC: 33.3 g/dL (ref 31.5–35.7)
MCV: 90 fL (ref 79–97)
Monocytes Absolute: 0.6 10*3/uL (ref 0.1–0.9)
Monocytes: 7 %
Neutrophils Absolute: 4.8 10*3/uL (ref 1.4–7.0)
Neutrophils: 61 %
Platelets: 364 10*3/uL (ref 150–450)
RBC: 4.89 x10E6/uL (ref 3.77–5.28)
RDW: 13.4 % (ref 11.7–15.4)
WBC: 7.9 10*3/uL (ref 3.4–10.8)

## 2021-09-30 LAB — TSH+FREE T4
Free T4: 1.13 ng/dL (ref 0.82–1.77)
TSH: 2.85 u[IU]/mL (ref 0.450–4.500)

## 2021-09-30 LAB — VITAMIN D 25 HYDROXY (VIT D DEFICIENCY, FRACTURES): Vit D, 25-Hydroxy: 32.9 ng/mL (ref 30.0–100.0)

## 2021-09-30 LAB — IRON,TIBC AND FERRITIN PANEL
Ferritin: 159 ng/mL — ABNORMAL HIGH (ref 15–150)
Iron Saturation: 20 % (ref 15–55)
Iron: 63 ug/dL (ref 27–139)
Total Iron Binding Capacity: 315 ug/dL (ref 250–450)
UIBC: 252 ug/dL (ref 118–369)

## 2021-09-30 LAB — LIPID PANEL WITH LDL/HDL RATIO
Cholesterol, Total: 237 mg/dL — ABNORMAL HIGH (ref 100–199)
HDL: 40 mg/dL
LDL Chol Calc (NIH): 169 mg/dL — ABNORMAL HIGH (ref 0–99)
LDL/HDL Ratio: 4.2 ratio — ABNORMAL HIGH (ref 0.0–3.2)
Triglycerides: 150 mg/dL — ABNORMAL HIGH (ref 0–149)
VLDL Cholesterol Cal: 28 mg/dL (ref 5–40)

## 2021-09-30 LAB — B12 AND FOLATE PANEL
Folate: 4.6 ng/mL
Vitamin B-12: 190 pg/mL — ABNORMAL LOW (ref 232–1245)

## 2021-10-01 ENCOUNTER — Telehealth: Payer: Medicare HMO

## 2021-10-06 ENCOUNTER — Ambulatory Visit
Admission: RE | Admit: 2021-10-06 | Discharge: 2021-10-06 | Disposition: A | Discharge status: Home / Self Care | Payer: Medicare HMO | Source: Ambulatory Visit | Attending: Physician Assistant | Admitting: Physician Assistant

## 2021-10-06 DIAGNOSIS — F17210 Nicotine dependence, cigarettes, uncomplicated: Secondary | ICD-10-CM | POA: Diagnosis not present

## 2021-10-06 DIAGNOSIS — J432 Centrilobular emphysema: Secondary | ICD-10-CM

## 2021-10-06 DIAGNOSIS — F17219 Nicotine dependence, cigarettes, with unspecified nicotine-induced disorders: Secondary | ICD-10-CM | POA: Diagnosis not present

## 2021-10-06 DIAGNOSIS — R0602 Shortness of breath: Secondary | ICD-10-CM

## 2021-10-06 DIAGNOSIS — R69 Illness, unspecified: Secondary | ICD-10-CM | POA: Diagnosis not present

## 2021-10-07 DIAGNOSIS — R0602 Shortness of breath: Secondary | ICD-10-CM | POA: Diagnosis not present

## 2021-10-10 ENCOUNTER — Ambulatory Visit (INDEPENDENT_AMBULATORY_CARE_PROVIDER_SITE_OTHER): Payer: Medicare HMO | Admitting: Physician Assistant

## 2021-10-10 ENCOUNTER — Encounter: Payer: Self-pay | Admitting: Physician Assistant

## 2021-10-10 VITALS — BP 151/81 | HR 60 | Temp 97.9°F | Resp 16 | Ht 68.0 in | Wt 155.0 lb

## 2021-10-10 DIAGNOSIS — Z1231 Encounter for screening mammogram for malignant neoplasm of breast: Secondary | ICD-10-CM | POA: Diagnosis not present

## 2021-10-10 DIAGNOSIS — E782 Mixed hyperlipidemia: Secondary | ICD-10-CM | POA: Diagnosis not present

## 2021-10-10 DIAGNOSIS — J432 Centrilobular emphysema: Secondary | ICD-10-CM

## 2021-10-10 DIAGNOSIS — E538 Deficiency of other specified B group vitamins: Secondary | ICD-10-CM | POA: Diagnosis not present

## 2021-10-10 DIAGNOSIS — F17219 Nicotine dependence, cigarettes, with unspecified nicotine-induced disorders: Secondary | ICD-10-CM

## 2021-10-10 DIAGNOSIS — I10 Essential (primary) hypertension: Secondary | ICD-10-CM

## 2021-10-10 DIAGNOSIS — R300 Dysuria: Secondary | ICD-10-CM | POA: Diagnosis not present

## 2021-10-10 DIAGNOSIS — N1832 Chronic kidney disease, stage 3b: Secondary | ICD-10-CM

## 2021-10-10 DIAGNOSIS — Z0001 Encounter for general adult medical examination with abnormal findings: Secondary | ICD-10-CM

## 2021-10-10 DIAGNOSIS — R69 Illness, unspecified: Secondary | ICD-10-CM | POA: Diagnosis not present

## 2021-10-10 MED ORDER — EZETIMIBE 10 MG PO TABS: 10.0000 mg | ORAL_TABLET | Freq: Every day | ORAL | 3 refills | Status: DC

## 2021-10-10 NOTE — Progress Notes (Signed)
[facility] [place], [place] [address]  Internal MEDICINE  Office Visit Note  Patient Name: Shannon Li  [id number]  [id number]  Date of Service: 10/26/2021  Chief Complaint  Patient presents with   Medicare Wellness   Depression   Gastroesophageal Reflux   Hypertension     HPI Pt is here for routine health maintenance examination CT chest reviewed: IMPRESSION: 1. Lung-RADS 2, benign appearance or behavior. Continue annual screening with low-dose chest CT without contrast in 12 months. 2. Three-vessel coronary atherosclerosis. 3. Chronic severe asymmetric right renal atrophy. 4. Aortic Atherosclerosis (ICD10-I70.0) and Emphysema (ICD10-J43.9).  -Cardiology visit next week to review echo -GI has her on Bactrim due to SIBO and is feeling a little better -Goes back to nephrology next month -Labs reviewed: impaired kidney function as before, B12 low,  borderline low vit D, very elevated cholesterol, but cannot tolerate statins and was told by nephrology to not to take statins. Will start on zetia now and also plans  to speak with cardiology about additional options, especially given CT chest findings of CAD and aortic atherosclerosis -life screening done a few weeks ago and had her carotid US and is going to get report for [place] -BP at home has been 130s/70s -Still due for bone density and will do this with mammogram  Current Medication: Outpatient Encounter Medications as of 10/10/2021  Medication Sig   amLODipine (NORVASC) 5 MG tablet TAKE 1 TABLET (5 MG TOTAL) BY MOUTH DAILY.   bisoprolol-hydrochlorothiazide (ZIAC) 2.5-6.25 MG tablet TAKE 1 TABLET BY MOUTH EVERY DAY FOR BLOOD PRESSURE   DULoxetine (CYMBALTA) 30 MG capsule TAKE ONE CAPSULE BY MOUTH DAILY   ezetimibe (ZETIA) 10 MG tablet Take 1 tablet (10 mg total) by mouth daily.   famotidine (PEPCID) 40 MG tablet Take 40 mg by mouth daily.   fluticasone (FLONASE) 50 MCG/ACT nasal spray Place 2  sprays into both nostrils daily.   meclizine (ANTIVERT) 12.5 MG tablet One tab po tid prn for dizziness   pantoprazole (PROTONIX) 40 MG tablet TAKE 1 TABLET BY MOUTH TWICE A DAY   [DISCONTINUED] rosuvastatin (CRESTOR) 5 MG tablet TAKE ONE TABLET BY MOUTH EVERY EVENING   No facility-administered encounter medications on file as of 10/10/2021.    Surgical History: Past Surgical History:  Procedure Laterality Date   ABSCESS DRAINAGE  11/17/2013   buttock right   BREAST BIOPSY Right    neg   CATARACT EXTRACTION W/PHACO Right 08/17/2019   Procedure: CATARACT EXTRACTION PHACO AND INTRAOCULAR LENS PLACEMENT (IOC) RIGHT VISION BLUE 5.32  00:42.7;  Surgeon: Marchia Meiers, MD;  Location: [place];  Service: Ophthalmology;  Laterality: Right;   CATARACT EXTRACTION W/PHACO Left 09/21/2019   Procedure: CATARACT EXTRACTION PHACO AND INTRAOCULAR LENS PLACEMENT ([place]) LEFT VISION BLUE;  Surgeon: Marchia Meiers, MD;  Location: [place];  Service: Ophthalmology;  Laterality: Left;  6.24 0:40.6   CHOLECYSTECTOMY  2008   COLONOSCOPY  2014   [facility] [place] N/A 08/26/2015   Procedure: COLONOSCOPY WITH PROPOFOL;  Surgeon: Hulen Luster, MD;  Location: [facility] ENDOSCOPY;  Service: Gastroenterology;  Laterality: N/A;   COLONOSCOPY WITH PROPOFOL N/A 03/03/2021   Procedure: COLONOSCOPY WITH PROPOFOL;  Surgeon: Lesly Rubenstein, MD;  Location: [facility] ENDOSCOPY;  Service: Endoscopy;  Laterality: N/A;   DILATION AND CURETTAGE OF UTERUS     ESOPHAGOGASTRODUODENOSCOPY N/A 11/15/2014   Procedure: ESOPHAGOGASTRODUODENOSCOPY (EGD);  Surgeon: Hulen Luster, MD;  Location: [facility]  ENDOSCOPY;  Service: Gastroenterology;  Laterality: N/A;   ESOPHAGOGASTRODUODENOSCOPY (EGD) WITH PROPOFOL N/A 03/03/2021   Procedure: ESOPHAGOGASTRODUODENOSCOPY (EGD) WITH PROPOFOL;  Surgeon: Lesly Rubenstein, MD;  Location: [facility] ENDOSCOPY;  Service: Endoscopy;  Laterality: N/A;   EYE SURGERY      GASTROSTOMY     UPPER GI ENDOSCOPY  2014   [facility] [place] History: Past Medical History:  Diagnosis Date   Anxiety    Arthritis    Atrophic kidney    Chronic constipation    Colon polyp 2013   COPD (chronic obstructive pulmonary disease) (HCC)    Depression    Diverticulosis    Esophageal stricture    Esophagitis    Fibrocystic breast disease    GERD (gastroesophageal reflux disease)    History of hiatal hernia    Hypertension    IBS (irritable bowel syndrome)    Menopause    Ovarian cyst    Perirectal abscess    Plantar fasciitis    Postmenopausal    Renal artery occlusion (HCC)    Renal insufficiency    Right kidney is small and not functioning.    Family History: Family History  Problem Relation Age of Onset   Heart disease Mother    Heart attack Mother    Diabetes Mother    Alzheimer's disease Father    Breast cancer Sister 29   Lung cancer Brother    Brain cancer Brother    Lung cancer Sister    Bladder Cancer Sister    Lung cancer Brother    Leukemia Brother    Diabetes Brother       Review of Systems  Constitutional:  Negative for chills, fatigue and unexpected weight change.  HENT:  Negative for congestion, postnasal drip, rhinorrhea, sneezing and sore throat.   Eyes:  Negative for redness.  Respiratory:  Negative for cough, chest tightness and shortness of breath.   Cardiovascular:  Negative for chest pain and palpitations.  Gastrointestinal:  Positive for abdominal pain and nausea. Negative for constipation and vomiting.  Genitourinary:  Negative for dysuria and frequency.  Musculoskeletal:  Negative for arthralgias, back pain, joint swelling and neck pain.  Skin:  Negative for rash.  Neurological: Negative.  Negative for tremors and numbness.  Hematological:  Negative for adenopathy. Does not bruise/bleed easily.  Psychiatric/Behavioral:  Negative for behavioral problems (Depression), sleep disturbance and  suicidal ideas. The patient is not nervous/anxious.      Vital Signs: BP (!) 151/81   Pulse 60   Temp 97.9 F (36.6 C)   Resp 16   Ht 5' 8" (1.727 m)   Wt 155 lb (70.3 kg)   SpO2 93%   BMI 23.57 kg/m    Physical Exam Vitals and nursing note reviewed.  Constitutional:      General: She is not in acute distress.    Appearance: She is well-developed. She is not diaphoretic.  HENT:     Head: Normocephalic and atraumatic.     Mouth/Throat:     Pharynx: No oropharyngeal exudate.  Eyes:     Pupils: Pupils are equal, round, and reactive to light.  Neck:     Thyroid: No thyromegaly.     Vascular: No JVD.     Trachea: No tracheal deviation.  Cardiovascular:     Rate and Rhythm: Normal rate and regular rhythm.     Heart sounds: Normal heart sounds. No murmur heard.    No friction rub. No gallop.  Pulmonary:     Effort: Pulmonary effort is normal. No respiratory distress.     Breath sounds: No wheezing or rales.  Chest:     Chest wall: No tenderness.  Abdominal:     General: Bowel sounds are normal.     Palpations: Abdomen is soft.  Musculoskeletal:        General: Normal range of motion.     Cervical back: Normal range of motion and neck supple.     Right lower leg: No edema.     Left lower leg: Edema present.  Lymphadenopathy:     Cervical: No cervical adenopathy.  Skin:    General: Skin is warm and dry.  Neurological:     Mental Status: She is alert and oriented to person, place, and time.     Cranial Nerves: No cranial nerve deficit.  Psychiatric:        Behavior: Behavior normal.        Thought Content: Thought content normal.        Judgment: Judgment normal.      LABS: Recent Results (from the past 2160 hour(s))  VITAMIN D 25 Hydroxy (Vit-D Deficiency, Fractures)     Status: None   Collection Time: 09/29/21  8:19 AM  Result Value Ref Range   Vit D, 25-Hydroxy 32.9 30.0 - 100.0 ng/mL    Comment: Vitamin D deficiency has been defined by the [place] practice guideline as a level of serum 25-OH vitamin D less than 20 ng/mL (1,2). The Endocrine Society went on to further define vitamin D insufficiency as a level between 21 and 29 ng/mL (2). 1. IOM (Institute of Medicine). 2010. Dietary reference    intakes for calcium and D. [place]: The    [employer]. 2. Holick MF, Binkley [place], Bischoff-Ferrari HA, et al.    Evaluation, treatment, and prevention of vitamin D    deficiency: an Endocrine Society clinical practice    guideline. JCEM. 2011 Jul; 96(7):1911-30.   TSH + free T4     Status: None   Collection Time: 09/29/21  8:19 AM  Result Value Ref Range   TSH 2.850 0.450 - 4.500 uIU/mL   Free T4 1.13 0.82 - 1.77 ng/dL  Comprehensive metabolic panel     Status: Abnormal   Collection Time: 09/29/21  8:19 AM  Result Value Ref Range   Glucose 96 70 - 99 mg/dL   BUN 25 8 - 27 mg/dL   Creatinine, Ser 2.15 (H) 0.57 - 1.00 mg/dL   eGFR 24 (L) >59 mL/min/1.73   BUN/Creatinine Ratio 12 12 - 28   Sodium 137 134 - 144 mmol/L   Potassium 4.4 3.5 - 5.2 mmol/L   Chloride 98 96 - 106 mmol/L   CO2 22 20 - 29 mmol/L   Calcium 9.5 8.7 - 10.3 mg/dL   Total Protein 7.0 6.0 - 8.5 g/dL   Albumin 4.5 3.7 - 4.7 g/dL   Globulin, Total 2.5 1.5 - 4.5 g/dL   Albumin/Globulin Ratio 1.8 1.2 - 2.2   Bilirubin Total <0.2 0.0 - 1.2 mg/dL   Alkaline Phosphatase 81 44 - 121 IU/L   AST 21 0 - 40 IU/L   ALT 15 0 - 32 IU/L  CBC w/Diff/Platelet     Status: None   Collection Time: 09/29/21  8:19 AM  Result Value Ref Range   WBC 7.9 3.4 - 10.8 x10E3/uL   RBC 4.89 3.77 - 5.28 x10E6/uL  Hemoglobin 14.7 11.1 - 15.9 g/dL   Hematocrit 44.2 34.0 - 46.6 %   MCV 90 79 - 97 fL   MCH 30.1 26.6 - 33.0 pg   MCHC 33.3 31.5 - 35.7 g/dL   RDW 13.4 11.7 - 15.4 %   Platelets 364 150 - 450 x10E3/uL   Neutrophils 61 Not Estab. %   Lymphs 27 Not Estab. %   Monocytes 7 Not Estab. %   Eos 4 Not Estab. %   Basos 1 Not  Estab. %   Neutrophils Absolute 4.8 1.4 - 7.0 x10E3/uL   Lymphocytes Absolute 2.2 0.7 - 3.1 x10E3/uL   Monocytes Absolute 0.6 0.1 - 0.9 x10E3/uL   EOS (ABSOLUTE) 0.3 0.0 - 0.4 x10E3/uL   Basophils Absolute 0.1 0.0 - 0.2 x10E3/uL   Immature Granulocytes 0 Not Estab. %   Immature Grans (Abs) 0.0 0.0 - 0.1 x10E3/uL  Lipid Panel With LDL/HDL Ratio     Status: Abnormal   Collection Time: 09/29/21  8:19 AM  Result Value Ref Range   Cholesterol, Total 237 (H) 100 - 199 mg/dL   Triglycerides 150 (H) 0 - 149 mg/dL   HDL 40 >39 mg/dL   VLDL Cholesterol Cal 28 5 - 40 mg/dL   LDL Chol Calc (NIH) 169 (H) 0 - 99 mg/dL   LDL/HDL Ratio 4.2 (H) 0.0 - 3.2 ratio    Comment:                                     LDL/HDL Ratio                                             Men  Women                               1/2 Avg.Risk  1.0    1.5                                   Avg.Risk  3.6    3.2                                2X Avg.Risk  6.2    5.0                                3X Avg.Risk  8.0    6.1   B12 and Folate Panel     Status: Abnormal   Collection Time: 09/29/21  8:19 AM  Result Value Ref Range   Vitamin B-12 190 (L) 232 - 1,245 pg/mL   Folate 4.6 >3.0 ng/mL    Comment: A serum folate concentration of less than 3.1 ng/mL is considered to represent clinical deficiency.   Fe+TIBC+Fer     Status: Abnormal   Collection Time: 09/29/21  8:19 AM  Result Value Ref Range   Total Iron Binding Capacity 315 250 - 450 ug/dL   UIBC 252 118 - 369 ug/dL   Iron 63 27 - 139 ug/dL   Iron Saturation 20 15 - 55 %  Ferritin 159 (H) 15 - 150 ng/mL  UA/M w/rflx Culture, Routine     Status: None   Collection Time: 10/10/21 11:48 AM   Specimen: Urine   Urine  Result Value Ref Range   Specific Gravity, UA 1.015 1.005 - 1.030   pH, UA 5.5 5.0 - 7.5   Color, UA Yellow Yellow   Appearance Ur Clear Clear   Leukocytes,UA Negative Negative   Protein,UA Trace Negative/Trace   Glucose, UA Negative Negative    Ketones, UA Negative Negative   RBC, UA Negative Negative   Bilirubin, UA Negative Negative   Urobilinogen, Ur 0.2 0.2 - 1.0 mg/dL   Nitrite, UA Negative Negative   Microscopic Examination Comment     Comment: Microscopic follows if indicated.   Microscopic Examination See below:     Comment: Microscopic was indicated and was performed.   Urinalysis Reflex Comment     Comment: This specimen will not reflex to a Urine Culture.  Microscopic Examination     Status: None   Collection Time: 10/10/21 11:48 AM   Urine  Result Value Ref Range   WBC, UA None seen 0 - 5 /hpf   RBC, Urine 0-2 0 - 2 /hpf   Epithelial Cells (non renal) 0-10 0 - 10 /hpf   Casts None seen None seen /lpf   Bacteria, UA None seen None seen/Few        Assessment/Plan: 1. Encounter for general adult medical examination with abnormal findings CPE performed, routine labs reviewed, will schedule mammogram and bone density  2. Essential hypertension Mildly elevated in office, will continue current medication and monitoring  3. Centrilobular emphysema ([place]) Will call with which inhaler preferred by insurance  4. Stage 3b chronic kidney disease ([place]) Followed by nephrology  5. Mixed hyperlipidemia Unable to tolerate statin, will start zetia and may discuss further at upcoming cardiology visit along with CT chest findings showing CAD and aortic atherosclerosis - ezetimibe (ZETIA) 10 MG tablet; Take 1 tablet (10 mg total) by mouth daily.  Dispense: 30 tablet; Refill: 3  6. Cigarette nicotine dependence with nicotine-induced disorder Continue to work on smoking cessation  7. Visit for screening mammogram - MM 3D SCREEN BREAST BILATERAL; Future  8. B12 deficiency Will start on B12 injections  9. Dysuria - UA/M w/rflx Culture, Routine - Microscopic Examination   General Counseling: aarica wax understanding of the findings of todays visit and agrees with plan of treatment. I have discussed any  further diagnostic evaluation that may be needed or ordered today. We also reviewed her medications today. she has been encouraged to call the office with any questions or concerns that should arise related to todays visit.    Counseling:    Orders Placed This Encounter  Procedures   Microscopic Examination   MM 3D SCREEN BREAST BILATERAL   UA/M w/rflx Culture, Routine    Meds ordered this encounter  Medications   ezetimibe (ZETIA) 10 MG tablet    Sig: Take 1 tablet (10 mg total) by mouth daily.    Dispense:  30 tablet    Refill:  3    This patient was seen by Drema Dallas, PA-C in collaboration with Dr. Clayborn Bigness as a part of collaborative care agreement.  Total time spent:35 Minutes  Time spent includes review of chart, medications, test results, and follow up plan with the patient.     Lavera Guise, MD  Internal Medicine

## 2021-10-11 LAB — UA/M W/RFLX CULTURE, ROUTINE
Bilirubin, UA: NEGATIVE
Glucose, UA: NEGATIVE
Ketones, UA: NEGATIVE
Leukocytes,UA: NEGATIVE
Nitrite, UA: NEGATIVE
RBC, UA: NEGATIVE
Specific Gravity, UA: 1.015 (ref 1.005–1.030)
Urobilinogen, Ur: 0.2 mg/dL (ref 0.2–1.0)
pH, UA: 5.5 (ref 5.0–7.5)

## 2021-10-11 LAB — MICROSCOPIC EXAMINATION
Bacteria, UA: NONE SEEN
Casts: NONE SEEN /LPF
WBC, UA: NONE SEEN /HPF (ref 0–5)

## 2021-10-14 DIAGNOSIS — I700 Atherosclerosis of aorta: Secondary | ICD-10-CM | POA: Diagnosis not present

## 2021-10-14 DIAGNOSIS — Z720 Tobacco use: Secondary | ICD-10-CM | POA: Diagnosis not present

## 2021-10-14 DIAGNOSIS — R5382 Chronic fatigue, unspecified: Secondary | ICD-10-CM | POA: Diagnosis not present

## 2021-10-14 DIAGNOSIS — R0609 Other forms of dyspnea: Secondary | ICD-10-CM | POA: Diagnosis not present

## 2021-10-14 DIAGNOSIS — I10 Essential (primary) hypertension: Secondary | ICD-10-CM | POA: Diagnosis not present

## 2021-10-14 DIAGNOSIS — I2510 Atherosclerotic heart disease of native coronary artery without angina pectoris: Secondary | ICD-10-CM | POA: Diagnosis not present

## 2021-10-14 DIAGNOSIS — R0602 Shortness of breath: Secondary | ICD-10-CM | POA: Diagnosis not present

## 2021-10-14 DIAGNOSIS — E7800 Pure hypercholesterolemia, unspecified: Secondary | ICD-10-CM | POA: Diagnosis not present

## 2021-10-17 ENCOUNTER — Ambulatory Visit (INDEPENDENT_AMBULATORY_CARE_PROVIDER_SITE_OTHER): Payer: Medicare HMO

## 2021-10-17 DIAGNOSIS — E538 Deficiency of other specified B group vitamins: Secondary | ICD-10-CM

## 2021-10-17 MED ORDER — CYANOCOBALAMIN 1000 MCG/ML IJ SOLN
1000.0000 ug | Freq: Once | INTRAMUSCULAR | Status: AC
  Administered 2021-10-17: 1000 ug via INTRAMUSCULAR

## 2021-10-22 DIAGNOSIS — J449 Chronic obstructive pulmonary disease, unspecified: Secondary | ICD-10-CM | POA: Diagnosis not present

## 2021-10-22 DIAGNOSIS — F419 Anxiety disorder, unspecified: Secondary | ICD-10-CM | POA: Diagnosis not present

## 2021-10-22 DIAGNOSIS — N261 Atrophy of kidney (terminal): Secondary | ICD-10-CM | POA: Diagnosis not present

## 2021-10-22 DIAGNOSIS — K219 Gastro-esophageal reflux disease without esophagitis: Secondary | ICD-10-CM | POA: Diagnosis not present

## 2021-10-22 DIAGNOSIS — N270 Small kidney, unilateral: Secondary | ICD-10-CM | POA: Diagnosis not present

## 2021-10-22 DIAGNOSIS — I10 Essential (primary) hypertension: Secondary | ICD-10-CM | POA: Diagnosis not present

## 2021-10-22 DIAGNOSIS — R69 Illness, unspecified: Secondary | ICD-10-CM | POA: Diagnosis not present

## 2021-10-22 DIAGNOSIS — N1832 Chronic kidney disease, stage 3b: Secondary | ICD-10-CM | POA: Diagnosis not present

## 2021-10-22 DIAGNOSIS — E7800 Pure hypercholesterolemia, unspecified: Secondary | ICD-10-CM | POA: Diagnosis not present

## 2021-10-24 ENCOUNTER — Ambulatory Visit (INDEPENDENT_AMBULATORY_CARE_PROVIDER_SITE_OTHER): Payer: Medicare HMO

## 2021-10-24 DIAGNOSIS — E538 Deficiency of other specified B group vitamins: Secondary | ICD-10-CM

## 2021-10-24 MED ORDER — CYANOCOBALAMIN 1000 MCG/ML IJ SOLN
1000.0000 ug | Freq: Once | INTRAMUSCULAR | Status: AC
  Administered 2021-10-24: 1000 ug via INTRAMUSCULAR

## 2021-10-29 DIAGNOSIS — N270 Small kidney, unilateral: Secondary | ICD-10-CM | POA: Diagnosis not present

## 2021-10-29 DIAGNOSIS — R69 Illness, unspecified: Secondary | ICD-10-CM | POA: Diagnosis not present

## 2021-10-29 DIAGNOSIS — J449 Chronic obstructive pulmonary disease, unspecified: Secondary | ICD-10-CM | POA: Diagnosis not present

## 2021-10-29 DIAGNOSIS — N261 Atrophy of kidney (terminal): Secondary | ICD-10-CM | POA: Diagnosis not present

## 2021-10-29 DIAGNOSIS — E7800 Pure hypercholesterolemia, unspecified: Secondary | ICD-10-CM | POA: Diagnosis not present

## 2021-10-29 DIAGNOSIS — K219 Gastro-esophageal reflux disease without esophagitis: Secondary | ICD-10-CM | POA: Diagnosis not present

## 2021-10-29 DIAGNOSIS — N1832 Chronic kidney disease, stage 3b: Secondary | ICD-10-CM | POA: Diagnosis not present

## 2021-10-29 DIAGNOSIS — I10 Essential (primary) hypertension: Secondary | ICD-10-CM | POA: Diagnosis not present

## 2021-11-03 DIAGNOSIS — I2510 Atherosclerotic heart disease of native coronary artery without angina pectoris: Secondary | ICD-10-CM | POA: Diagnosis not present

## 2021-11-03 DIAGNOSIS — R0609 Other forms of dyspnea: Secondary | ICD-10-CM | POA: Diagnosis not present

## 2021-11-03 DIAGNOSIS — I700 Atherosclerosis of aorta: Secondary | ICD-10-CM | POA: Diagnosis not present

## 2021-11-12 DIAGNOSIS — Z720 Tobacco use: Secondary | ICD-10-CM | POA: Diagnosis not present

## 2021-11-12 DIAGNOSIS — I10 Essential (primary) hypertension: Secondary | ICD-10-CM | POA: Diagnosis not present

## 2021-11-12 DIAGNOSIS — E7800 Pure hypercholesterolemia, unspecified: Secondary | ICD-10-CM | POA: Diagnosis not present

## 2021-11-13 ENCOUNTER — Telehealth: Payer: Self-pay

## 2021-11-13 NOTE — Telephone Encounter (Signed)
Pt called that she saw cardiology yesterday and they decrease her amlodipine 2.5 mg daily and increase bisoprolol 5-6.25 she said they recommend to see pulmonary she said she like to wait for now and I notified lauren about med changes by cardiologist

## 2021-11-21 ENCOUNTER — Ambulatory Visit (INDEPENDENT_AMBULATORY_CARE_PROVIDER_SITE_OTHER): Payer: Medicare HMO

## 2021-11-21 DIAGNOSIS — E538 Deficiency of other specified B group vitamins: Secondary | ICD-10-CM | POA: Diagnosis not present

## 2021-11-21 MED ORDER — CYANOCOBALAMIN 1000 MCG/ML IJ SOLN
1000.0000 ug | Freq: Once | INTRAMUSCULAR | Status: AC
  Administered 2021-11-21: 1000 ug via INTRAMUSCULAR

## 2021-11-27 ENCOUNTER — Ambulatory Visit
Admission: RE | Admit: 2021-11-27 | Discharge: 2021-11-27 | Disposition: A | Discharge status: Home / Self Care | Payer: Medicare HMO | Source: Ambulatory Visit | Attending: Physician Assistant | Admitting: Physician Assistant

## 2021-11-27 DIAGNOSIS — E2839 Other primary ovarian failure: Secondary | ICD-10-CM | POA: Diagnosis not present

## 2021-11-27 DIAGNOSIS — Z780 Asymptomatic menopausal state: Secondary | ICD-10-CM | POA: Diagnosis not present

## 2021-11-27 DIAGNOSIS — Z1231 Encounter for screening mammogram for malignant neoplasm of breast: Secondary | ICD-10-CM

## 2021-11-27 DIAGNOSIS — M8589 Other specified disorders of bone density and structure, multiple sites: Secondary | ICD-10-CM | POA: Diagnosis not present

## 2021-11-28 DIAGNOSIS — H43813 Vitreous degeneration, bilateral: Secondary | ICD-10-CM | POA: Diagnosis not present

## 2021-12-14 ENCOUNTER — Other Ambulatory Visit: Payer: Self-pay | Admitting: Physician Assistant

## 2021-12-14 DIAGNOSIS — I10 Essential (primary) hypertension: Secondary | ICD-10-CM

## 2021-12-19 ENCOUNTER — Ambulatory Visit (INDEPENDENT_AMBULATORY_CARE_PROVIDER_SITE_OTHER): Payer: Medicare HMO

## 2021-12-19 DIAGNOSIS — E538 Deficiency of other specified B group vitamins: Secondary | ICD-10-CM

## 2021-12-19 MED ORDER — CYANOCOBALAMIN 1000 MCG/ML IJ SOLN
1000.0000 ug | Freq: Once | INTRAMUSCULAR | Status: AC
  Administered 2021-12-19: 1000 ug via INTRAMUSCULAR

## 2021-12-23 DIAGNOSIS — D485 Neoplasm of uncertain behavior of skin: Secondary | ICD-10-CM | POA: Diagnosis not present

## 2021-12-23 DIAGNOSIS — D2261 Melanocytic nevi of right upper limb, including shoulder: Secondary | ICD-10-CM | POA: Diagnosis not present

## 2021-12-23 DIAGNOSIS — D2272 Melanocytic nevi of left lower limb, including hip: Secondary | ICD-10-CM | POA: Diagnosis not present

## 2021-12-23 DIAGNOSIS — Z85828 Personal history of other malignant neoplasm of skin: Secondary | ICD-10-CM | POA: Diagnosis not present

## 2021-12-23 DIAGNOSIS — L570 Actinic keratosis: Secondary | ICD-10-CM | POA: Diagnosis not present

## 2021-12-23 DIAGNOSIS — D2262 Melanocytic nevi of left upper limb, including shoulder: Secondary | ICD-10-CM | POA: Diagnosis not present

## 2021-12-23 DIAGNOSIS — C44622 Squamous cell carcinoma of skin of right upper limb, including shoulder: Secondary | ICD-10-CM | POA: Diagnosis not present

## 2022-01-06 ENCOUNTER — Other Ambulatory Visit: Payer: Self-pay | Admitting: Physician Assistant

## 2022-01-06 DIAGNOSIS — K219 Gastro-esophageal reflux disease without esophagitis: Secondary | ICD-10-CM

## 2022-01-06 DIAGNOSIS — E782 Mixed hyperlipidemia: Secondary | ICD-10-CM

## 2022-01-09 ENCOUNTER — Encounter: Payer: Self-pay | Admitting: Physician Assistant

## 2022-01-09 ENCOUNTER — Ambulatory Visit (INDEPENDENT_AMBULATORY_CARE_PROVIDER_SITE_OTHER): Payer: Medicare HMO | Admitting: Physician Assistant

## 2022-01-09 VITALS — BP 136/80 | HR 53 | Temp 98.0°F | Resp 16 | Ht 68.0 in | Wt 160.0 lb

## 2022-01-09 DIAGNOSIS — N1832 Chronic kidney disease, stage 3b: Secondary | ICD-10-CM

## 2022-01-09 DIAGNOSIS — K219 Gastro-esophageal reflux disease without esophagitis: Secondary | ICD-10-CM

## 2022-01-09 DIAGNOSIS — I10 Essential (primary) hypertension: Secondary | ICD-10-CM

## 2022-01-09 DIAGNOSIS — M8588 Other specified disorders of bone density and structure, other site: Secondary | ICD-10-CM | POA: Diagnosis not present

## 2022-01-09 DIAGNOSIS — R69 Illness, unspecified: Secondary | ICD-10-CM | POA: Diagnosis not present

## 2022-01-09 DIAGNOSIS — F330 Major depressive disorder, recurrent, mild: Secondary | ICD-10-CM | POA: Diagnosis not present

## 2022-01-09 MED ORDER — DULOXETINE HCL 20 MG PO CPEP: 20.0000 mg | ORAL_CAPSULE | Freq: Two times a day (BID) | ORAL | 3 refills | Status: DC

## 2022-01-09 NOTE — Progress Notes (Signed)
[facility] [place], [place] [address]  Internal MEDICINE  Office Visit Note  Patient Name: Shannon Li  [id number]  [id number]  Date of Service: 01/14/2022  Chief Complaint  Patient presents with   Follow-up   Hypertension   Depression    HPI Pt is here for routine follow up -Has seen nephrology and they are monitoring her labs -Will be seeing GI -Cardiology has done stress test and echo -Bone density showed osteopenia still. Slightly worse from previous, 7 years prior. Stays active and will supplement vit D and calcium. She already has significant reflux and GI problems therefore will avoid oral bisphosphonates and is not interested in seeing endo for possible infusions at this time either. -depression has been worse, did lose a friend last week. Will try increasing her cymbalta to '20mg'$  BID.  Current Medication: Outpatient Encounter Medications as of 01/09/2022  Medication Sig   amLODipine (NORVASC) 5 MG tablet TAKE 1 TABLET (5 MG TOTAL) BY MOUTH DAILY.   bisoprolol-hydrochlorothiazide (ZIAC) 2.5-6.25 MG tablet TAKE 1 TABLET BY MOUTH EVERY DAY FOR BLOOD PRESSURE   DULoxetine (CYMBALTA) 20 MG capsule Take 1 capsule (20 mg total) by mouth 2 (two) times daily.   ezetimibe (ZETIA) 10 MG tablet TAKE 1 TABLET BY MOUTH EVERY DAY   famotidine (PEPCID) 40 MG tablet Take 40 mg by mouth daily.   pantoprazole (PROTONIX) 40 MG tablet TAKE 1 TABLET BY MOUTH TWICE A DAY   [DISCONTINUED] DULoxetine (CYMBALTA) 30 MG capsule TAKE ONE CAPSULE BY MOUTH DAILY   meclizine (ANTIVERT) 12.5 MG tablet One tab po tid prn for dizziness   No facility-administered encounter medications on file as of 01/09/2022.    Surgical History: Past Surgical History:  Procedure Laterality Date   ABSCESS DRAINAGE  11/17/2013   buttock right   BREAST BIOPSY Right    neg   CATARACT EXTRACTION W/PHACO Right 08/17/2019   Procedure: CATARACT EXTRACTION PHACO AND INTRAOCULAR LENS  PLACEMENT (IOC) RIGHT VISION BLUE 5.32  00:42.7;  Surgeon: Marchia Meiers, MD;  Location: [place];  Service: Ophthalmology;  Laterality: Right;   CATARACT EXTRACTION W/PHACO Left 09/21/2019   Procedure: CATARACT EXTRACTION PHACO AND INTRAOCULAR LENS PLACEMENT ([place]) LEFT VISION BLUE;  Surgeon: Marchia Meiers, MD;  Location: [place];  Service: Ophthalmology;  Laterality: Left;  6.24 0:40.6   CHOLECYSTECTOMY  2008   COLONOSCOPY  2014   [facility] [place] N/A 08/26/2015   Procedure: COLONOSCOPY WITH PROPOFOL;  Surgeon: Hulen Luster, MD;  Location: [facility] ENDOSCOPY;  Service: Gastroenterology;  Laterality: N/A;   COLONOSCOPY WITH PROPOFOL N/A 03/03/2021   Procedure: COLONOSCOPY WITH PROPOFOL;  Surgeon: Lesly Rubenstein, MD;  Location: [facility] ENDOSCOPY;  Service: Endoscopy;  Laterality: N/A;   DILATION AND CURETTAGE OF UTERUS     ESOPHAGOGASTRODUODENOSCOPY N/A 11/15/2014   Procedure: ESOPHAGOGASTRODUODENOSCOPY (EGD);  Surgeon: Hulen Luster, MD;  [facility] ENDOSCOPY;  Service: Gastroenterology;  Laterality: N/A;   ESOPHAGOGASTRODUODENOSCOPY (EGD) WITH PROPOFOL N/A 03/03/2021   Procedure: ESOPHAGOGASTRODUODENOSCOPY (EGD) WITH PROPOFOL;  Surgeon: Lesly Rubenstein, MD;  Location: [facility] ENDOSCOPY;  Service: Endoscopy;  Laterality: N/A;   EYE SURGERY     GASTROSTOMY     UPPER GI ENDOSCOPY  2014   [facility] [place] History: Past Medical History:  Diagnosis Date   Anxiety    Arthritis    Atrophic kidney    Chronic constipation    Colon polyp 2013  COPD (chronic obstructive pulmonary disease) (HCC)    Depression    Diverticulosis    Esophageal stricture    Esophagitis    Fibrocystic breast disease    GERD (gastroesophageal reflux disease)    History of hiatal hernia    Hypertension    IBS (irritable bowel syndrome)    Menopause    Ovarian cyst    Perirectal abscess    Plantar fasciitis    Postmenopausal     Renal artery occlusion (HCC)    Renal insufficiency    Right kidney is small and not functioning.    Family History: Family History  Problem Relation Age of Onset   Heart disease Mother    Heart attack Mother    Diabetes Mother    Alzheimer's disease Father    Breast cancer Sister 71   Lung cancer Brother    Brain cancer Brother    Lung cancer Sister    Bladder Cancer Sister    Lung cancer Brother    Leukemia Brother    Diabetes Brother     Social History   Socioeconomic History   Marital status: Widowed    Spouse name: Not on file   Number of children: Not on file   Years of education: Not on file   Highest education level: Not on file  Occupational History   Not on file  Tobacco Use   Smoking status: Every Day    Packs/day: 0.50    Years: 30.00    Total pack years: 15.00    Types: Cigarettes   Smokeless tobacco: Never   Tobacco comments:    1/2 Pack Daily  Vaping Use   Vaping Use: Never used  Substance and Sexual Activity   Alcohol use: No   Drug use: No   Sexual activity: Not on file  Other Topics Concern   Not on file  Social History Narrative   Not on file   Social Determinants of Health   Financial Resource Strain: Low Risk  (10/15/2020)   Overall Financial Resource Strain (CARDIA)    Difficulty of Paying Living Expenses: Not very hard  Food Insecurity: Not on file  Transportation Needs: Not on file  Physical Activity: Not on file  Stress: Not on file  Social Connections: Not on file  Intimate Partner Violence: Not on file      Review of Systems  Constitutional:  Negative for chills, fatigue and unexpected weight change.  HENT:  Negative for congestion, postnasal drip, rhinorrhea, sneezing and sore throat.   Eyes:  Negative for redness.  Respiratory:  Negative for cough, chest tightness and shortness of breath.   Cardiovascular:  Negative for chest pain and palpitations.  Gastrointestinal:  Positive for abdominal pain and nausea.  Negative for constipation and vomiting.  Genitourinary:  Negative for dysuria and frequency.  Musculoskeletal:  Negative for arthralgias, back pain, joint swelling and neck pain.  Skin:  Negative for rash.  Neurological: Negative.  Negative for tremors and numbness.  Hematological:  Negative for adenopathy. Does not bruise/bleed easily.  Psychiatric/Behavioral:  Positive for behavioral problems (Depression). Negative for sleep disturbance and suicidal ideas. The patient is not nervous/anxious.     Vital Signs: BP 136/80   Pulse (!) 53   Temp 98 F (36.7 C)   Resp 16   Ht '5\' 8"'$  (1.727 m)   Wt 160 lb (72.6 kg)   SpO2 98%   BMI 24.33 kg/m    Physical Exam Vitals and nursing note reviewed.  Constitutional:      General: She is not in acute distress.    Appearance: Normal appearance. She is well-developed and normal weight. She is not diaphoretic.  HENT:     Head: Normocephalic and atraumatic.     Mouth/Throat:     Pharynx: No oropharyngeal exudate.  Eyes:     Pupils: Pupils are equal, round, and reactive to light.  Neck:     Thyroid: No thyromegaly.     Vascular: No JVD.     Trachea: No tracheal deviation.  Cardiovascular:     Rate and Rhythm: Normal rate and regular rhythm.     Heart sounds: Normal heart sounds. No murmur heard.    No friction rub. No gallop.  Pulmonary:     Effort: Pulmonary effort is normal. No respiratory distress.     Breath sounds: No wheezing or rales.  Chest:     Chest wall: No tenderness.  Abdominal:     General: Bowel sounds are normal.     Palpations: Abdomen is soft.  Musculoskeletal:        General: Normal range of motion.     Cervical back: Normal range of motion and neck supple.  Lymphadenopathy:     Cervical: No cervical adenopathy.  Skin:    General: Skin is warm and dry.  Neurological:     Mental Status: She is alert and oriented to person, place, and time.     Cranial Nerves: No cranial nerve deficit.  Psychiatric:         Behavior: Behavior normal.        Thought Content: Thought content normal.        Judgment: Judgment normal.        Assessment/Plan: 1. Essential hypertension Stable, continue current medications  2. Osteopenia of lumbar spine Due to significant GI sx will avoid Oral bisphosphonates. May consider endo referral in future if patient changes her mind. Will continue to stay active and supplement vit D and calcium  3. Mild episode of recurrent major depressive disorder (HCC) Will increase cymbalta to '20mg'$  BID  4. Gastroesophageal reflux disease without esophagitis Continue protonix, followed by GI  5. Stage 3b chronic kidney disease ([place]) Followed by nephrology    General Counseling: Truitt Leep understanding of the findings of todays visit and agrees with plan of treatment. I have discussed any further diagnostic evaluation that may be needed or ordered today. We also reviewed her medications today. she has been encouraged to call the office with any questions or concerns that should arise related to todays visit.    No orders of the defined types were placed in this encounter.   Meds ordered this encounter  Medications   DULoxetine (CYMBALTA) 20 MG capsule    Sig: Take 1 capsule (20 mg total) by mouth 2 (two) times daily.    Dispense:  60 capsule    Refill:  3    This patient was seen by Drema Dallas, PA-C in collaboration with Dr. Clayborn Bigness as a part of collaborative care agreement.   Total time spent:30 Minutes Time spent includes review of chart, medications, test results, and follow up plan with the patient.      Dr Lavera Guise Internal medicine

## 2022-01-23 ENCOUNTER — Ambulatory Visit (INDEPENDENT_AMBULATORY_CARE_PROVIDER_SITE_OTHER): Payer: Medicare HMO

## 2022-01-23 DIAGNOSIS — E538 Deficiency of other specified B group vitamins: Secondary | ICD-10-CM

## 2022-01-23 MED ORDER — CYANOCOBALAMIN 1000 MCG/ML IJ SOLN
1000.0000 ug | Freq: Once | INTRAMUSCULAR | Status: AC
  Administered 2022-01-23: 1000 ug via INTRAMUSCULAR

## 2022-02-06 ENCOUNTER — Ambulatory Visit (INDEPENDENT_AMBULATORY_CARE_PROVIDER_SITE_OTHER): Payer: Medicare HMO | Admitting: Physician Assistant

## 2022-02-06 ENCOUNTER — Encounter: Payer: Self-pay | Admitting: Physician Assistant

## 2022-02-06 VITALS — BP 145/69 | HR 70 | Temp 96.9°F | Resp 16 | Ht 68.0 in | Wt 162.4 lb

## 2022-02-06 DIAGNOSIS — F330 Major depressive disorder, recurrent, mild: Secondary | ICD-10-CM

## 2022-02-06 DIAGNOSIS — R69 Illness, unspecified: Secondary | ICD-10-CM | POA: Diagnosis not present

## 2022-02-06 DIAGNOSIS — I10 Essential (primary) hypertension: Secondary | ICD-10-CM | POA: Diagnosis not present

## 2022-02-06 NOTE — Progress Notes (Signed)
[facility] [place], [place] [address]  Internal MEDICINE  Office Visit Note  Patient Name: Shannon Li  [id number]  [id number]  Date of Service: 02/11/2022  Chief Complaint  Patient presents with   Follow-up   Depression   Gastroesophageal Reflux   Hypertension    HPI Pt is here for routine follow up -Taking pepcid in AM and famotidine at night -1/2 amlodipine at night and ziac in AM -BP at home [phone] systolic and has been stable. Up a little in office due to stress. Found out her niece's husband is very sick with cancer and heart blockages and she has been worried about them and others. -Increasing cymbalta didn't change much. She would rather not change medications further at this time as she thinks she will be able to manage. She reports having good days and bad days and seems like spells where she gets worried and will start to cry, but she works through it.   Current Medication: Outpatient Encounter Medications as of 02/06/2022  Medication Sig   amLODipine (NORVASC) 5 MG tablet TAKE 1 TABLET (5 MG TOTAL) BY MOUTH DAILY.   bisoprolol-hydrochlorothiazide (ZIAC) 2.5-6.25 MG tablet TAKE 1 TABLET BY MOUTH EVERY DAY FOR BLOOD PRESSURE   DULoxetine (CYMBALTA) 20 MG capsule Take 1 capsule (20 mg total) by mouth 2 (two) times daily.   ezetimibe (ZETIA) 10 MG tablet TAKE 1 TABLET BY MOUTH EVERY DAY   famotidine (PEPCID) 40 MG tablet Take 40 mg by mouth daily.   pantoprazole (PROTONIX) 40 MG tablet TAKE 1 TABLET BY MOUTH TWICE A DAY   No facility-administered encounter medications on file as of 02/06/2022.    Surgical History: Past Surgical History:  Procedure Laterality Date   ABSCESS DRAINAGE  11/17/2013   buttock right   BREAST BIOPSY Right    neg   CATARACT EXTRACTION W/PHACO Right 08/17/2019   Procedure: CATARACT EXTRACTION PHACO AND INTRAOCULAR LENS PLACEMENT (IOC) RIGHT VISION BLUE 5.32  00:42.7;  Surgeon: Marchia Meiers, MD;  Location:  [place];  Service: Ophthalmology;  Laterality: Right;   CATARACT EXTRACTION W/PHACO Left 09/21/2019   Procedure: CATARACT EXTRACTION PHACO AND INTRAOCULAR LENS PLACEMENT ([place]) LEFT VISION BLUE;  Surgeon: Marchia Meiers, MD;  Location: [place];  Service: Ophthalmology;  Laterality: Left;  6.24 0:40.6   CHOLECYSTECTOMY  2008   COLONOSCOPY  2014   [facility] [place] N/A 08/26/2015   Procedure: COLONOSCOPY WITH PROPOFOL;  Surgeon: Hulen Luster, MD;  Location: [facility] ENDOSCOPY;  Service: Gastroenterology;  Laterality: N/A;   COLONOSCOPY WITH PROPOFOL N/A 03/03/2021   Procedure: COLONOSCOPY WITH PROPOFOL;  Surgeon: Lesly Rubenstein, MD;  Location: [facility] ENDOSCOPY;  Service: Endoscopy;  Laterality: N/A;   DILATION AND CURETTAGE OF UTERUS     ESOPHAGOGASTRODUODENOSCOPY N/A 11/15/2014   Procedure: ESOPHAGOGASTRODUODENOSCOPY (EGD);  Surgeon: Hulen Luster, MD;  Location: [facility] ENDOSCOPY;  Service: Gastroenterology;  Laterality: N/A;   ESOPHAGOGASTRODUODENOSCOPY (EGD) WITH PROPOFOL N/A 03/03/2021   Procedure: ESOPHAGOGASTRODUODENOSCOPY (EGD) WITH PROPOFOL;  Surgeon: Lesly Rubenstein, MD;  Location: [facility] ENDOSCOPY;  Service: Endoscopy;  Laterality: N/A;   EYE SURGERY     GASTROSTOMY     UPPER GI ENDOSCOPY  2014   [facility] [place] History: Past Medical History:  Diagnosis Date   Anxiety    Arthritis    Atrophic kidney    Chronic constipation    Colon polyp 2013   COPD (chronic  obstructive pulmonary disease) (HCC)    Depression    Diverticulosis    Esophageal stricture    Esophagitis    Fibrocystic breast disease    GERD (gastroesophageal reflux disease)    History of hiatal hernia    Hypertension    IBS (irritable bowel syndrome)    Menopause    Ovarian cyst    Perirectal abscess    Plantar fasciitis    Postmenopausal    Renal artery occlusion (HCC)    Renal insufficiency    Right kidney is small and  not functioning.    Family History: Family History  Problem Relation Age of Onset   Heart disease Mother    Heart attack Mother    Diabetes Mother    Alzheimer's disease Father    Breast cancer Sister 74   Lung cancer Brother    Brain cancer Brother    Lung cancer Sister    Bladder Cancer Sister    Lung cancer Brother    Leukemia Brother    Diabetes Brother     Social History   Socioeconomic History   Marital status: Widowed    Spouse name: Not on file   Number of children: Not on file   Years of education: Not on file   Highest education level: Not on file  Occupational History   Not on file  Tobacco Use   Smoking status: Every Day    Packs/day: 0.50    Years: 30.00    Total pack years: 15.00    Types: Cigarettes   Smokeless tobacco: Never   Tobacco comments:    1/2 Pack Daily  Vaping Use   Vaping Use: Never used  Substance and Sexual Activity   Alcohol use: No   Drug use: No   Sexual activity: Not on file  Other Topics Concern   Not on file  Social History Narrative   Not on file   Social Determinants of Health   Financial Resource Strain: Low Risk  (10/15/2020)   Overall Financial Resource Strain (CARDIA)    Difficulty of Paying Living Expenses: Not very hard  Food Insecurity: Not on file  Transportation Needs: Not on file  Physical Activity: Not on file  Stress: Not on file  Social Connections: Not on file  Intimate Partner Violence: Not on file      Review of Systems  Constitutional:  Negative for chills, fatigue and unexpected weight change.  HENT:  Negative for congestion, postnasal drip, rhinorrhea, sneezing and sore throat.   Eyes:  Negative for redness.  Respiratory:  Negative for cough, chest tightness and shortness of breath.   Cardiovascular:  Negative for chest pain and palpitations.  Gastrointestinal:  Negative for constipation and vomiting.  Genitourinary:  Negative for dysuria and frequency.  Musculoskeletal:  Negative for  arthralgias, back pain, joint swelling and neck pain.  Skin:  Negative for rash.  Neurological: Negative.  Negative for tremors and numbness.  Hematological:  Negative for adenopathy. Does not bruise/bleed easily.  Psychiatric/Behavioral:  Positive for behavioral problems (Depression). Negative for self-injury, sleep disturbance and suicidal ideas. The patient is nervous/anxious.     Vital Signs: BP (!) 145/69   Pulse 70   Temp (!) 96.9 F (36.1 C)   Resp 16   Ht '5\' 8"'$  (1.727 m)   Wt 162 lb 6.4 oz (73.7 kg)   SpO2 98%   BMI 24.69 kg/m    Physical Exam Vitals and nursing note reviewed.  Constitutional:  General: She is not in acute distress.    Appearance: Normal appearance. She is well-developed and normal weight. She is not diaphoretic.  HENT:     Head: Normocephalic and atraumatic.     Mouth/Throat:     Pharynx: No oropharyngeal exudate.  Eyes:     Pupils: Pupils are equal, round, and reactive to light.  Neck:     Thyroid: No thyromegaly.     Vascular: No JVD.     Trachea: No tracheal deviation.  Cardiovascular:     Rate and Rhythm: Normal rate and regular rhythm.     Heart sounds: Normal heart sounds. No murmur heard.    No friction rub. No gallop.  Pulmonary:     Effort: Pulmonary effort is normal. No respiratory distress.     Breath sounds: No wheezing or rales.  Chest:     Chest wall: No tenderness.  Abdominal:     General: Bowel sounds are normal.     Palpations: Abdomen is soft.  Musculoskeletal:        General: Normal range of motion.     Cervical back: Normal range of motion and neck supple.  Lymphadenopathy:     Cervical: No cervical adenopathy.  Skin:    General: Skin is warm and dry.  Neurological:     Mental Status: She is alert and oriented to person, place, and time.     Cranial Nerves: No cranial nerve deficit.  Psychiatric:        Behavior: Behavior normal.        Thought Content: Thought content normal.        Judgment: Judgment  normal.     Comments: Tearful in office        Assessment/Plan: 1. Essential hypertension Slightly elevated in office due to stress, well controlled at home and will continue current medications and monitoring  2. Mild episode of recurrent major depressive disorder ([place]) Will continue cymbalta and pt will call if any worsening or changes   General Counseling: kalicia dufresne understanding of the findings of todays visit and agrees with plan of treatment. I have discussed any further diagnostic evaluation that may be needed or ordered today. We also reviewed her medications today. she has been encouraged to call the office with any questions or concerns that should arise related to todays visit.    No orders of the defined types were placed in this encounter.   No orders of the defined types were placed in this encounter.   This patient was seen by Drema Dallas, PA-C in collaboration with Dr. Clayborn Bigness as a part of collaborative care agreement.   Total time spent:30 Minutes Time spent includes review of chart, medications, test results, and follow up plan with the patient.      Dr Lavera Guise Internal medicine

## 2022-02-12 DIAGNOSIS — C44622 Squamous cell carcinoma of skin of right upper limb, including shoulder: Secondary | ICD-10-CM | POA: Diagnosis not present

## 2022-02-12 DIAGNOSIS — D0461 Carcinoma in situ of skin of right upper limb, including shoulder: Secondary | ICD-10-CM | POA: Diagnosis not present

## 2022-02-12 DIAGNOSIS — D485 Neoplasm of uncertain behavior of skin: Secondary | ICD-10-CM | POA: Diagnosis not present

## 2022-02-16 ENCOUNTER — Encounter (INDEPENDENT_AMBULATORY_CARE_PROVIDER_SITE_OTHER): Payer: Self-pay

## 2022-02-20 ENCOUNTER — Ambulatory Visit (INDEPENDENT_AMBULATORY_CARE_PROVIDER_SITE_OTHER): Payer: Medicare HMO

## 2022-02-20 DIAGNOSIS — E538 Deficiency of other specified B group vitamins: Secondary | ICD-10-CM | POA: Diagnosis not present

## 2022-02-20 MED ORDER — CYANOCOBALAMIN 1000 MCG/ML IJ SOLN
1000.0000 ug | Freq: Once | INTRAMUSCULAR | Status: AC
  Administered 2022-02-20: 1000 ug via INTRAMUSCULAR

## 2022-02-26 DIAGNOSIS — D0461 Carcinoma in situ of skin of right upper limb, including shoulder: Secondary | ICD-10-CM | POA: Diagnosis not present

## 2022-02-26 DIAGNOSIS — C44622 Squamous cell carcinoma of skin of right upper limb, including shoulder: Secondary | ICD-10-CM | POA: Diagnosis not present

## 2022-02-26 DIAGNOSIS — H43812 Vitreous degeneration, left eye: Secondary | ICD-10-CM | POA: Diagnosis not present

## 2022-03-19 DIAGNOSIS — H43812 Vitreous degeneration, left eye: Secondary | ICD-10-CM | POA: Diagnosis not present

## 2022-03-20 ENCOUNTER — Ambulatory Visit (INDEPENDENT_AMBULATORY_CARE_PROVIDER_SITE_OTHER): Payer: Medicare HMO

## 2022-03-20 DIAGNOSIS — E538 Deficiency of other specified B group vitamins: Secondary | ICD-10-CM

## 2022-03-20 MED ORDER — CYANOCOBALAMIN 1000 MCG/ML IJ SOLN
1000.0000 ug | Freq: Once | INTRAMUSCULAR | Status: AC
  Administered 2022-03-20: 1000 ug via INTRAMUSCULAR

## 2022-04-09 DIAGNOSIS — H43812 Vitreous degeneration, left eye: Secondary | ICD-10-CM | POA: Diagnosis not present

## 2022-04-24 ENCOUNTER — Ambulatory Visit (INDEPENDENT_AMBULATORY_CARE_PROVIDER_SITE_OTHER): Payer: Medicare HMO

## 2022-04-24 DIAGNOSIS — E538 Deficiency of other specified B group vitamins: Secondary | ICD-10-CM

## 2022-04-24 MED ORDER — CYANOCOBALAMIN 1000 MCG/ML IJ SOLN
1000.0000 ug | Freq: Once | INTRAMUSCULAR | Status: AC
  Administered 2022-04-24: 1000 ug via INTRAMUSCULAR

## 2022-05-06 ENCOUNTER — Other Ambulatory Visit: Payer: Self-pay | Admitting: Nurse Practitioner

## 2022-05-06 DIAGNOSIS — I10 Essential (primary) hypertension: Secondary | ICD-10-CM

## 2022-05-07 ENCOUNTER — Other Ambulatory Visit: Payer: Self-pay | Admitting: Nurse Practitioner

## 2022-05-07 ENCOUNTER — Other Ambulatory Visit: Payer: Self-pay | Admitting: Physician Assistant

## 2022-05-07 DIAGNOSIS — I10 Essential (primary) hypertension: Secondary | ICD-10-CM

## 2022-05-07 NOTE — Telephone Encounter (Signed)
Pt see cardiologist waiting for response from pat that  cardiologist doing or [place]

## 2022-06-12 ENCOUNTER — Encounter: Payer: Self-pay | Admitting: Physician Assistant

## 2022-06-12 ENCOUNTER — Ambulatory Visit (INDEPENDENT_AMBULATORY_CARE_PROVIDER_SITE_OTHER): Payer: Medicare HMO | Admitting: Physician Assistant

## 2022-06-12 VITALS — BP 137/80 | HR 67 | Temp 98.4°F | Resp 16 | Ht 68.0 in | Wt 176.0 lb

## 2022-06-12 DIAGNOSIS — F330 Major depressive disorder, recurrent, mild: Secondary | ICD-10-CM

## 2022-06-12 DIAGNOSIS — J432 Centrilobular emphysema: Secondary | ICD-10-CM

## 2022-06-12 DIAGNOSIS — I10 Essential (primary) hypertension: Secondary | ICD-10-CM | POA: Diagnosis not present

## 2022-06-12 DIAGNOSIS — R69 Illness, unspecified: Secondary | ICD-10-CM | POA: Diagnosis not present

## 2022-06-12 MED ORDER — FLUTICASONE FUROATE-VILANTEROL 100-25 MCG/ACT IN AEPB: 1.0000 | INHALATION_SPRAY | Freq: Every day | RESPIRATORY_TRACT | 11 refills | Status: DC

## 2022-06-12 MED ORDER — DULOXETINE HCL 20 MG PO CPEP: 20.0000 mg | ORAL_CAPSULE | Freq: Two times a day (BID) | ORAL | 1 refills | Status: DC

## 2022-06-12 NOTE — Progress Notes (Signed)
[facility] [place], [place] [address]  Internal MEDICINE  Office Visit Note  Patient Name: Shannon Li  [id number]  [id number]  Date of Service: 06/12/2022  Chief Complaint  Patient presents with   Follow-up   Depression   Gastroesophageal Reflux   Hypertension    HPI Pt is here for routine follow up -Has been feeling better -Stomach did start acting back up again yesterday, but feeling better today. This had been doing much better after seeing GI and taking ABX for bacterial overgrowth. Reports she took bactrim at that time because xifaxin was too expensive. -She states her depression has been much better on cymbalta BID and would like a 90day supply sent for future refills -Needs breo refills as well, would prefer to continue this than try switching to trelegy for triple therapy -She is having leg cramps at night and trying mustard and eating bananas. Prefers to wait to have labs done until CPE unless worsening.   Current Medication: Outpatient Encounter Medications as of 06/12/2022  Medication Sig   amLODipine (NORVASC) 5 MG tablet TAKE 1 TABLET (5 MG TOTAL) BY MOUTH DAILY.   bisoprolol-hydrochlorothiazide (ZIAC) 2.5-6.25 MG tablet TAKE 1 TABLET BY MOUTH EVERY DAY FOR BLOOD PRESSURE   ezetimibe (ZETIA) 10 MG tablet TAKE 1 TABLET BY MOUTH EVERY DAY   famotidine (PEPCID) 40 MG tablet Take 40 mg by mouth daily.   fluticasone furoate-vilanterol (BREO ELLIPTA) 100-25 MCG/ACT AEPB Inhale 1 puff into the lungs daily.   pantoprazole (PROTONIX) 40 MG tablet TAKE 1 TABLET BY MOUTH TWICE A DAY   [DISCONTINUED] DULoxetine (CYMBALTA) 20 MG capsule TAKE 1 CAPSULE BY MOUTH TWICE A DAY   DULoxetine (CYMBALTA) 20 MG capsule Take 1 capsule (20 mg total) by mouth 2 (two) times daily.   No facility-administered encounter medications on file as of 06/12/2022.    Surgical History: Past Surgical History:  Procedure Laterality Date   ABSCESS DRAINAGE  11/17/2013    buttock right   BREAST BIOPSY Right    neg   CATARACT EXTRACTION W/PHACO Right 08/17/2019   Procedure: CATARACT EXTRACTION PHACO AND INTRAOCULAR LENS PLACEMENT (IOC) RIGHT VISION BLUE 5.32  00:42.7;  Surgeon: Marchia Meiers, MD;  Location: [place];  Service: Ophthalmology;  Laterality: Right;   CATARACT EXTRACTION W/PHACO Left 09/21/2019   Procedure: CATARACT EXTRACTION PHACO AND INTRAOCULAR LENS PLACEMENT ([place]) LEFT VISION BLUE;  Surgeon: Marchia Meiers, MD;  Location: [place];  Service: Ophthalmology;  Laterality: Left;  6.24 0:40.6   CHOLECYSTECTOMY  2008   COLONOSCOPY  2014   [facility] [place] N/A 08/26/2015   Procedure: COLONOSCOPY WITH PROPOFOL;  Surgeon: Hulen Luster, MD;  Location: [facility] ENDOSCOPY;  Service: Gastroenterology;  Laterality: N/A;   COLONOSCOPY WITH PROPOFOL N/A 03/03/2021   Procedure: COLONOSCOPY WITH PROPOFOL;  Surgeon: Lesly Rubenstein, MD;  Location: [facility] ENDOSCOPY;  Service: Endoscopy;  Laterality: N/A;   DILATION AND CURETTAGE OF UTERUS     ESOPHAGOGASTRODUODENOSCOPY N/A 11/15/2014   Procedure: ESOPHAGOGASTRODUODENOSCOPY (EGD);  Surgeon: Hulen Luster, MD;  Location: [facility] ENDOSCOPY;  Service: Gastroenterology;  Laterality: N/A;   ESOPHAGOGASTRODUODENOSCOPY (EGD) WITH PROPOFOL N/A 03/03/2021   Procedure: ESOPHAGOGASTRODUODENOSCOPY (EGD) WITH PROPOFOL;  Surgeon: Lesly Rubenstein, MD;  Location: [facility] ENDOSCOPY;  Service: Endoscopy;  Laterality: N/A;   EYE SURGERY     GASTROSTOMY     UPPER GI ENDOSCOPY  2014   [facility] [place] History:  Past Medical History:  Diagnosis Date   Anxiety    Arthritis    Atrophic kidney    Chronic constipation    Colon polyp 2013   COPD (chronic obstructive pulmonary disease) (HCC)    Depression    Diverticulosis    Esophageal stricture    Esophagitis    Fibrocystic breast disease    GERD (gastroesophageal reflux disease)    History of  hiatal hernia    Hypertension    IBS (irritable bowel syndrome)    Menopause    Ovarian cyst    Perirectal abscess    Plantar fasciitis    Postmenopausal    Renal artery occlusion (HCC)    Renal insufficiency    Right kidney is small and not functioning.    Family History: Family History  Problem Relation Age of Onset   Heart disease Mother    Heart attack Mother    Diabetes Mother    Alzheimer's disease Father    Breast cancer Sister 41   Lung cancer Brother    Brain cancer Brother    Lung cancer Sister    Bladder Cancer Sister    Lung cancer Brother    Leukemia Brother    Diabetes Brother     Social History   Socioeconomic History   Marital status: Widowed    Spouse name: Not on file   Number of children: Not on file   Years of education: Not on file   Highest education level: Not on file  Occupational History   Not on file  Tobacco Use   Smoking status: Every Day    Packs/day: 0.50    Years: 30.00    Total pack years: 15.00    Types: Cigarettes   Smokeless tobacco: Never   Tobacco comments:    1/2 Pack Daily  Vaping Use   Vaping Use: Never used  Substance and Sexual Activity   Alcohol use: No   Drug use: No   Sexual activity: Not on file  Other Topics Concern   Not on file  Social History Narrative   Not on file   Social Determinants of Health   Financial Resource Strain: Low Risk  (10/15/2020)   Overall Financial Resource Strain (CARDIA)    Difficulty of Paying Living Expenses: Not very hard  Food Insecurity: Not on file  Transportation Needs: Not on file  Physical Activity: Not on file  Stress: Not on file  Social Connections: Not on file  Intimate Partner Violence: Not on file      Review of Systems  Constitutional:  Negative for chills, fatigue and unexpected weight change.  HENT:  Negative for congestion, postnasal drip, rhinorrhea, sneezing and sore throat.   Eyes:  Negative for redness.  Respiratory:  Negative for cough, chest  tightness and shortness of breath.   Cardiovascular:  Negative for chest pain and palpitations.  Gastrointestinal:  Negative for constipation, diarrhea, nausea and vomiting.  Genitourinary:  Negative for dysuria and frequency.  Musculoskeletal:  Negative for arthralgias, back pain, joint swelling and neck pain.  Skin:  Negative for rash.  Neurological: Negative.  Negative for tremors and numbness.  Hematological:  Negative for adenopathy. Does not bruise/bleed easily.  Psychiatric/Behavioral:  Negative for behavioral problems (Depression), sleep disturbance and suicidal ideas. The patient is not nervous/anxious.     Vital Signs: BP 137/80   Pulse 67   Temp 98.4 F (36.9 C)   Resp 16   Ht '5\' 8"'$  (1.727 m)   Wt  176 lb (79.8 kg)   SpO2 94%   BMI 26.76 kg/m    Physical Exam Vitals and nursing note reviewed.  Constitutional:      General: She is not in acute distress.    Appearance: Normal appearance. She is well-developed and normal weight. She is not diaphoretic.  HENT:     Head: Normocephalic and atraumatic.     Mouth/Throat:     Pharynx: No oropharyngeal exudate.  Eyes:     Pupils: Pupils are equal, round, and reactive to light.  Neck:     Thyroid: No thyromegaly.     Vascular: No JVD.     Trachea: No tracheal deviation.  Cardiovascular:     Rate and Rhythm: Normal rate and regular rhythm.     Heart sounds: Normal heart sounds. No murmur heard.    No friction rub. No gallop.  Pulmonary:     Effort: Pulmonary effort is normal. No respiratory distress.     Breath sounds: No wheezing or rales.  Chest:     Chest wall: No tenderness.  Abdominal:     General: Bowel sounds are normal.     Palpations: Abdomen is soft.  Musculoskeletal:        General: Normal range of motion.     Cervical back: Normal range of motion and neck supple.  Lymphadenopathy:     Cervical: No cervical adenopathy.  Skin:    General: Skin is warm and dry.  Neurological:     Mental Status: She is  alert and oriented to person, place, and time.     Cranial Nerves: No cranial nerve deficit.  Psychiatric:        Behavior: Behavior normal.        Thought Content: Thought content normal.        Judgment: Judgment normal.        Assessment/Plan: 1. Essential hypertension Stable, continue current medications  2. Mild episode of recurrent major depressive disorder (HCC) Improving, continue cymbalta as before - DULoxetine (CYMBALTA) 20 MG capsule; Take 1 capsule (20 mg total) by mouth 2 (two) times daily.  Dispense: 180 capsule; Refill: 1  3. Centrilobular emphysema (HCC) - fluticasone furoate-vilanterol (BREO ELLIPTA) 100-25 MCG/ACT AEPB; Inhale 1 puff into the lungs daily.  Dispense: 1 each; Refill: 11   General Counseling: Truitt Leep understanding of the findings of todays visit and agrees with plan of treatment. I have discussed any further diagnostic evaluation that may be needed or ordered today. We also reviewed her medications today. she has been encouraged to call the office with any questions or concerns that should arise related to todays visit.    No orders of the defined types were placed in this encounter.   Meds ordered this encounter  Medications   DULoxetine (CYMBALTA) 20 MG capsule    Sig: Take 1 capsule (20 mg total) by mouth 2 (two) times daily.    Dispense:  180 capsule    Refill:  1   fluticasone furoate-vilanterol (BREO ELLIPTA) 100-25 MCG/ACT AEPB    Sig: Inhale 1 puff into the lungs daily.    Dispense:  1 each    Refill:  11    This patient was seen by Drema Dallas, PA-C in collaboration with Dr. Clayborn Bigness as a part of collaborative care agreement.   Total time spent:30 Minutes Time spent includes review of chart, medications, test results, and follow up plan with the patient.      Dr Lavera Guise Internal medicine

## 2022-06-15 ENCOUNTER — Other Ambulatory Visit: Payer: Self-pay

## 2022-06-15 DIAGNOSIS — J432 Centrilobular emphysema: Secondary | ICD-10-CM

## 2022-06-15 MED ORDER — FLUTICASONE FUROATE-VILANTEROL 100-25 MCG/ACT IN AEPB: 1.0000 | INHALATION_SPRAY | Freq: Every day | RESPIRATORY_TRACT | 11 refills | Status: DC

## 2022-06-29 DIAGNOSIS — D485 Neoplasm of uncertain behavior of skin: Secondary | ICD-10-CM | POA: Diagnosis not present

## 2022-06-29 DIAGNOSIS — D2272 Melanocytic nevi of left lower limb, including hip: Secondary | ICD-10-CM | POA: Diagnosis not present

## 2022-06-29 DIAGNOSIS — Z85828 Personal history of other malignant neoplasm of skin: Secondary | ICD-10-CM | POA: Diagnosis not present

## 2022-06-29 DIAGNOSIS — D0461 Carcinoma in situ of skin of right upper limb, including shoulder: Secondary | ICD-10-CM | POA: Diagnosis not present

## 2022-06-29 DIAGNOSIS — D2262 Melanocytic nevi of left upper limb, including shoulder: Secondary | ICD-10-CM | POA: Diagnosis not present

## 2022-06-29 DIAGNOSIS — D2261 Melanocytic nevi of right upper limb, including shoulder: Secondary | ICD-10-CM | POA: Diagnosis not present

## 2022-06-29 DIAGNOSIS — L570 Actinic keratosis: Secondary | ICD-10-CM | POA: Diagnosis not present

## 2022-06-29 DIAGNOSIS — D225 Melanocytic nevi of trunk: Secondary | ICD-10-CM | POA: Diagnosis not present

## 2022-07-02 ENCOUNTER — Other Ambulatory Visit: Payer: Self-pay | Admitting: Physician Assistant

## 2022-07-02 DIAGNOSIS — E782 Mixed hyperlipidemia: Secondary | ICD-10-CM

## 2022-08-09 ENCOUNTER — Other Ambulatory Visit: Payer: Self-pay | Admitting: Physician Assistant

## 2022-08-09 DIAGNOSIS — K219 Gastro-esophageal reflux disease without esophagitis: Secondary | ICD-10-CM

## 2022-08-18 DIAGNOSIS — D0461 Carcinoma in situ of skin of right upper limb, including shoulder: Secondary | ICD-10-CM | POA: Diagnosis not present

## 2022-10-16 ENCOUNTER — Ambulatory Visit (INDEPENDENT_AMBULATORY_CARE_PROVIDER_SITE_OTHER): Payer: Medicare HMO | Admitting: Physician Assistant

## 2022-10-16 ENCOUNTER — Encounter: Payer: Self-pay | Admitting: Physician Assistant

## 2022-10-16 VITALS — BP 119/76 | HR 69 | Temp 97.8°F | Resp 16 | Ht 68.0 in | Wt 184.0 lb

## 2022-10-16 DIAGNOSIS — Z1231 Encounter for screening mammogram for malignant neoplasm of breast: Secondary | ICD-10-CM | POA: Diagnosis not present

## 2022-10-16 DIAGNOSIS — I10 Essential (primary) hypertension: Secondary | ICD-10-CM | POA: Diagnosis not present

## 2022-10-16 DIAGNOSIS — E782 Mixed hyperlipidemia: Secondary | ICD-10-CM

## 2022-10-16 DIAGNOSIS — E559 Vitamin D deficiency, unspecified: Secondary | ICD-10-CM | POA: Diagnosis not present

## 2022-10-16 DIAGNOSIS — R300 Dysuria: Secondary | ICD-10-CM

## 2022-10-16 DIAGNOSIS — R5383 Other fatigue: Secondary | ICD-10-CM | POA: Diagnosis not present

## 2022-10-16 DIAGNOSIS — F330 Major depressive disorder, recurrent, mild: Secondary | ICD-10-CM | POA: Diagnosis not present

## 2022-10-16 DIAGNOSIS — N1832 Chronic kidney disease, stage 3b: Secondary | ICD-10-CM

## 2022-10-16 DIAGNOSIS — R7989 Other specified abnormal findings of blood chemistry: Secondary | ICD-10-CM

## 2022-10-16 DIAGNOSIS — Z0001 Encounter for general adult medical examination with abnormal findings: Secondary | ICD-10-CM | POA: Diagnosis not present

## 2022-10-16 DIAGNOSIS — M7989 Other specified soft tissue disorders: Secondary | ICD-10-CM

## 2022-10-16 DIAGNOSIS — E538 Deficiency of other specified B group vitamins: Secondary | ICD-10-CM

## 2022-10-16 MED ORDER — HYDROCHLOROTHIAZIDE 12.5 MG PO TABS: 12.5000 mg | ORAL_TABLET | Freq: Every day | ORAL | 3 refills | Status: DC

## 2022-10-16 MED ORDER — DULOXETINE HCL 20 MG PO CPEP: 20.0000 mg | ORAL_CAPSULE | Freq: Two times a day (BID) | ORAL | 1 refills | Status: DC

## 2022-10-16 NOTE — Progress Notes (Signed)
[facility] [address] [place], [place] [address]  Internal MEDICINE  Office Visit Note  Patient Name: Shannon Li  [id number]  [id number]  Date of Service: 10/16/2022  Chief Complaint  Patient presents with   Medicare Wellness   Depression   Hypertension     HPI Pt is here for routine health maintenance examination -taking same norvasc and ziac from cardiology now. They had tried lower norvasc and increasing ziac to see if this helped left leg swelling but it did not and instead she felt dizzy. States stress test normal and echo normal. ABI normal previously as well. Has not been back to cardiology--unclear if she cancelled appt thinking it unnecessary or not -Left leg still swollen for unknown reason and feels tight but otherwise not painful. Discussed trial of increased hydrochlorothiazide to 12.5 mg and holding Ziac to see if this helps swelling without making her dizzy with HR/BP low. She will monitor closely and may cut amlodipine in half if BP low. -right flank pain, some difficulty urinating but has urge to urinate. Will send urine to lab -Has not been back to nephrology recently either as she said her labs were stable and she couldn't keep going to so many appts. She plans to schedule as needed after we order labs now to recheck, especially with increased hydrochlorothiazide  -Mammogram due in Aug     10/16/2022   11:12 AM 10/10/2021   10:51 AM 09/09/2020   10:25 AM 09/07/2019    9:07 AM 09/01/2018    9:11 AM  MMSE - Mini Mental State Exam  Orientation to time 5 5 5 5 5   Orientation to Place 5 5 5 5 5   Registration 3 3 3 3 3   Attention/ Calculation 5 5 5 5 5   Recall 3 3 3 3 3   Language- name 2 objects 2 2 2 2 2   Language- repeat 1 1 1 1 1   Language- follow 3 step command 3 3 3 3 3   Language- read & follow direction 1 1 1 1 1   Write a sentence 1 1 1 1 1   Copy design 1 1 1 1 1   Total score 30 30 30 30 30       10/16/2022   11:12 AM 02/06/2022     9:44 AM 01/09/2022   11:00 AM 09/11/2021   11:21 AM 06/16/2021   11:41 AM  Depression screen PHQ 2/9  Decreased Interest 0 1 1 0 0  Down, Depressed, Hopeless 0 1 0 0 0  PHQ - 2 Score 0 2 1 0 0  Altered sleeping  1     Tired, decreased energy  1     Change in appetite  0     Feeling bad or failure about yourself   0     Trouble concentrating  0     Moving slowly or fidgety/restless  0     Suicidal thoughts  0     PHQ-9 Score  4     Difficult doing work/chores  Not difficult at all            Current Medication: Outpatient Encounter Medications as of 10/16/2022  Medication Sig   amLODipine (NORVASC) 5 MG tablet TAKE 1 TABLET (5 MG TOTAL) BY MOUTH DAILY.   bisoprolol-hydrochlorothiazide (ZIAC) 2.5-6.25 MG tablet TAKE 1 TABLET BY MOUTH EVERY DAY FOR BLOOD PRESSURE   ezetimibe (ZETIA) 10 MG tablet TAKE 1 TABLET BY MOUTH EVERY DAY   famotidine (PEPCID) 40 MG tablet  Take 40 mg by mouth daily.   fluticasone furoate-vilanterol (BREO ELLIPTA) 100-25 MCG/ACT AEPB Inhale 1 puff into the lungs daily.   hydrochlorothiazide (HYDRODIURIL) 12.5 MG tablet Take 1 tablet (12.5 mg total) by mouth daily.   pantoprazole (PROTONIX) 40 MG tablet TAKE 1 TABLET BY MOUTH TWICE A DAY   [DISCONTINUED] DULoxetine (CYMBALTA) 20 MG capsule Take 1 capsule (20 mg total) by mouth 2 (two) times daily.   DULoxetine (CYMBALTA) 20 MG capsule Take 1 capsule (20 mg total) by mouth 2 (two) times daily.   No facility-administered encounter medications on file as of 10/16/2022.    Surgical History: Past Surgical History:  Procedure Laterality Date   ABSCESS DRAINAGE  11/17/2013   buttock right   BREAST BIOPSY Right    neg   CATARACT EXTRACTION W/PHACO Right 08/17/2019   Procedure: CATARACT EXTRACTION PHACO AND INTRAOCULAR LENS PLACEMENT (IOC) RIGHT VISION BLUE 5.32  00:42.7;  Surgeon: Elliot Cousin, MD;  Location: [facility];  Service: Ophthalmology;  Laterality: Right;   CATARACT EXTRACTION W/PHACO Left  09/21/2019   Procedure: CATARACT EXTRACTION PHACO AND INTRAOCULAR LENS PLACEMENT (IOC) LEFT VISION BLUE;  Surgeon: Elliot Cousin, MD;  Location: [facility];  Service: Ophthalmology;  Laterality: Left;  6.24 0:40.6   CHOLECYSTECTOMY  2008   COLONOSCOPY  2014   [facility] in [place]   COLONOSCOPY WITH PROPOFOL N/A 08/26/2015   Procedure: COLONOSCOPY WITH PROPOFOL;  Surgeon: Wallace Cullens, MD;  Location: [facility];  Service: Gastroenterology;  Laterality: N/A;   COLONOSCOPY WITH PROPOFOL N/A 03/03/2021   Procedure: COLONOSCOPY WITH PROPOFOL;  Surgeon: Regis Bill, MD;  Location: [facility] ENDOSCOPY;  Service: Endoscopy;  Laterality: N/A;   DILATION AND CURETTAGE OF UTERUS     ESOPHAGOGASTRODUODENOSCOPY N/A 11/15/2014   Procedure: ESOPHAGOGASTRODUODENOSCOPY (EGD);  Surgeon: Wallace Cullens, MD;  Location: [facility];  Service: Gastroenterology;  Laterality: N/A;   ESOPHAGOGASTRODUODENOSCOPY (EGD) WITH PROPOFOL N/A 03/03/2021   Procedure: ESOPHAGOGASTRODUODENOSCOPY (EGD) WITH PROPOFOL;  Surgeon: Regis Bill, MD;  Location: [facility] ENDOSCOPY;  Service: Endoscopy;  Laterality: N/A;   EYE SURGERY     GASTROSTOMY     UPPER GI ENDOSCOPY  2014   [facility] in [place]    Medical History: Past Medical History:  Diagnosis Date   Anxiety    Arthritis    Atrophic kidney    Chronic constipation    Colon polyp 2013   COPD (chronic obstructive pulmonary disease) (HCC)    Depression    Diverticulosis    Esophageal stricture    Esophagitis    Fibrocystic breast disease    GERD (gastroesophageal reflux disease)    History of hiatal hernia    Hypertension    IBS (irritable bowel syndrome)    Menopause    Ovarian cyst    Perirectal abscess    Plantar fasciitis    Postmenopausal    Renal artery occlusion (HCC)    Renal insufficiency    Right kidney is small and not functioning.    Family History: Family History  Problem Relation Age of Onset   Heart disease  Mother    Heart attack Mother    Diabetes Mother    Alzheimer's disease Father    Breast cancer Sister 25   Lung cancer Brother    Brain cancer Brother    Lung cancer Sister    Bladder Cancer Sister    Lung cancer Brother    Leukemia Brother    Diabetes Brother  Review of Systems  Constitutional:  Positive for fatigue. Negative for chills and unexpected weight change.  HENT:  Negative for congestion, postnasal drip, rhinorrhea, sneezing and sore throat.   Eyes:  Negative for redness.  Respiratory:  Negative for cough, chest tightness and shortness of breath.   Cardiovascular:  Positive for leg swelling. Negative for chest pain and palpitations.  Gastrointestinal:  Negative for constipation, diarrhea and vomiting.  Genitourinary:  Positive for flank pain and urgency. Negative for dysuria.  Musculoskeletal:  Positive for back pain. Negative for arthralgias, joint swelling and neck pain.  Skin:  Negative for rash.  Neurological: Negative.  Negative for tremors and numbness.  Hematological:  Negative for adenopathy. Does not bruise/bleed easily.  Psychiatric/Behavioral:  Negative for behavioral problems (Depression), sleep disturbance and suicidal ideas. The patient is not nervous/anxious.      Vital Signs: BP 119/76   Pulse 69   Temp 97.8 F (36.6 C)   Resp 16   Ht 5\' 8"  (1.727 m)   Wt 184 lb (83.5 kg)   SpO2 95%   BMI 27.98 kg/m    Physical Exam Vitals and nursing note reviewed.  Constitutional:      General: She is not in acute distress.    Appearance: Normal appearance. She is well-developed and normal weight. She is not diaphoretic.  HENT:     Head: Normocephalic and atraumatic.     Mouth/Throat:     Pharynx: No oropharyngeal exudate.  Eyes:     Pupils: Pupils are equal, round, and reactive to light.  Neck:     Thyroid: No thyromegaly.     Vascular: No JVD.     Trachea: No tracheal deviation.  Cardiovascular:     Rate and Rhythm: Normal rate and  regular rhythm.     Heart sounds: Normal heart sounds. No murmur heard.    No friction rub. No gallop.  Pulmonary:     Effort: Pulmonary effort is normal. No respiratory distress.     Breath sounds: No wheezing or rales.  Chest:     Chest wall: No tenderness.  Abdominal:     General: Bowel sounds are normal.     Palpations: Abdomen is soft.     Tenderness: There is no abdominal tenderness.  Musculoskeletal:        General: Normal range of motion.     Cervical back: Normal range of motion and neck supple.     Right lower leg: No edema.     Left lower leg: Edema present.  Lymphadenopathy:     Cervical: No cervical adenopathy.  Skin:    General: Skin is warm and dry.  Neurological:     Mental Status: She is alert and oriented to person, place, and time.     Cranial Nerves: No cranial nerve deficit.  Psychiatric:        Behavior: Behavior normal.        Thought Content: Thought content normal.        Judgment: Judgment normal.      LABS: No results found for this or any previous visit (from the past 2160 hour(s)).      Assessment/Plan: 1. Encounter for general adult medical examination with abnormal findings CPE performed, labs ordered, mammogram due in Aug  2. Essential hypertension Will continue norvasc and hold Ziac. Will add increased hydrochlorothiazide in place of ziac and pt will monitor Bp and swelling - hydrochlorothiazide (HYDRODIURIL) 12.5 MG tablet; Take 1 tablet (12.5 mg total) by mouth daily.  Dispense: 30 tablet; Refill: 3  3. Mild episode of recurrent major depressive disorder (HCC) - DULoxetine (CYMBALTA) 20 MG capsule; Take 1 capsule (20 mg total) by mouth 2 (two) times daily.  Dispense: 180 capsule; Refill: 1  4. Left leg swelling Will try compression and elevation and will hold ziac in favor of increased hydrochlorothiazide. Will monitor labs after increased dose  5. Stage 3b chronic kidney disease (HCC) - Comprehensive metabolic panel  6. B12  deficiency - B12 and Folate Panel  7. Mixed hyperlipidemia - Lipid Panel With LDL/HDL Ratio  8. Vitamin D deficiency - VITAMIN D 25 Hydroxy (Vit-D Deficiency, Fractures)  9. Abnormal thyroid blood test - TSH + free T4  10. Visit for screening mammogram - MM 3D SCREENING MAMMOGRAM BILATERAL BREAST; Future  11. Other fatigue - CBC w/Diff/Platelet - Comprehensive metabolic panel - TSH + free T4 - Lipid Panel With LDL/HDL Ratio - B12 and Folate Panel - VITAMIN D 25 Hydroxy (Vit-D Deficiency, Fractures) - Fe+TIBC+Fer  12. Dysuria - UA/M w/rflx Culture, Routine   General Counseling: jimisha askeland understanding of the findings of todays visit and agrees with plan of treatment. I have discussed any further diagnostic evaluation that may be needed or ordered today. We also reviewed her medications today. she has been encouraged to call the office with any questions or concerns that should arise related to todays visit.    Counseling:    Orders Placed This Encounter  Procedures   MM 3D SCREENING MAMMOGRAM BILATERAL BREAST   UA/M w/rflx Culture, Routine   CBC w/Diff/Platelet   Comprehensive metabolic panel   TSH + free T4   Lipid Panel With LDL/HDL Ratio   B12 and Folate Panel   VITAMIN D 25 Hydroxy (Vit-D Deficiency, Fractures)   Fe+TIBC+Fer    Meds ordered this encounter  Medications   DULoxetine (CYMBALTA) 20 MG capsule    Sig: Take 1 capsule (20 mg total) by mouth 2 (two) times daily.    Dispense:  180 capsule    Refill:  1   hydrochlorothiazide (HYDRODIURIL) 12.5 MG tablet    Sig: Take 1 tablet (12.5 mg total) by mouth daily.    Dispense:  30 tablet    Refill:  3    This patient was seen by Lynn Ito, PA-C in collaboration with Dr. Beverely Risen as a part of collaborative care agreement.  Total time spent:35 Minutes  Time spent includes review of chart, medications, test results, and follow up plan with the patient.     Lyndon Code,  MD  Internal Medicine

## 2022-10-17 LAB — MICROSCOPIC EXAMINATION
Casts: NONE SEEN /LPF
Epithelial Cells (non renal): >10 /HPF — AB (ref 0–10)

## 2022-10-17 LAB — UA/M W/RFLX CULTURE, ROUTINE
Bilirubin, UA: NEGATIVE
Glucose, UA: NEGATIVE
Leukocytes,UA: NEGATIVE
Nitrite, UA: NEGATIVE
RBC, UA: NEGATIVE
Specific Gravity, UA: 1.022 (ref 1.005–1.030)
Urobilinogen, Ur: 0.2 mg/dL (ref 0.2–1.0)
pH, UA: 5 (ref 5.0–7.5)

## 2022-10-20 ENCOUNTER — Other Ambulatory Visit: Payer: Self-pay

## 2022-10-20 NOTE — Telephone Encounter (Signed)
Spoke pt  that UA is normal is contamination  as per lauren we can send antibiotic if said symptoms pt  don't want antibiotic advised her to drink plenty of water and cranberry juice not feeling better need appt

## 2022-11-02 DIAGNOSIS — R5383 Other fatigue: Secondary | ICD-10-CM | POA: Diagnosis not present

## 2022-11-02 DIAGNOSIS — R7989 Other specified abnormal findings of blood chemistry: Secondary | ICD-10-CM | POA: Diagnosis not present

## 2022-11-02 DIAGNOSIS — E782 Mixed hyperlipidemia: Secondary | ICD-10-CM | POA: Diagnosis not present

## 2022-11-02 DIAGNOSIS — E559 Vitamin D deficiency, unspecified: Secondary | ICD-10-CM | POA: Diagnosis not present

## 2022-11-02 DIAGNOSIS — N1832 Chronic kidney disease, stage 3b: Secondary | ICD-10-CM | POA: Diagnosis not present

## 2022-11-02 DIAGNOSIS — E538 Deficiency of other specified B group vitamins: Secondary | ICD-10-CM | POA: Diagnosis not present

## 2022-11-03 LAB — COMPREHENSIVE METABOLIC PANEL WITH GFR
ALT: 14 [IU]/L (ref 0–32)
AST: 20 [IU]/L (ref 0–40)
Albumin: 4.5 g/dL (ref 3.8–4.8)
Alkaline Phosphatase: 89 [IU]/L (ref 44–121)
BUN/Creatinine Ratio: 13 (ref 12–28)
BUN: 22 mg/dL (ref 8–27)
Bilirubin Total: 0.3 mg/dL (ref 0.0–1.2)
CO2: 22 mmol/L (ref 20–29)
Calcium: 9.5 mg/dL (ref 8.7–10.3)
Chloride: 100 mmol/L (ref 96–106)
Creatinine, Ser: 1.7 mg/dL — ABNORMAL HIGH (ref 0.57–1.00)
Globulin, Total: 2.4 g/dL (ref 1.5–4.5)
Glucose: 110 mg/dL — ABNORMAL HIGH (ref 70–99)
Potassium: 4.1 mmol/L (ref 3.5–5.2)
Sodium: 140 mmol/L (ref 134–144)
Total Protein: 6.9 g/dL (ref 6.0–8.5)
eGFR: 31 mL/min/{1.73_m2} — ABNORMAL LOW

## 2022-11-03 LAB — CBC WITH DIFFERENTIAL/PLATELET
Basophils Absolute: 0.1 10*3/uL (ref 0.0–0.2)
Basos: 1 %
EOS (ABSOLUTE): 0.3 10*3/uL (ref 0.0–0.4)
Eos: 5 %
Hematocrit: 43.6 % (ref 34.0–46.6)
Hemoglobin: 14.2 g/dL (ref 11.1–15.9)
Immature Grans (Abs): 0 10*3/uL (ref 0.0–0.1)
Immature Granulocytes: 0 %
Lymphocytes Absolute: 2.2 10*3/uL (ref 0.7–3.1)
Lymphs: 30 %
MCH: 29.5 pg (ref 26.6–33.0)
MCHC: 32.6 g/dL (ref 31.5–35.7)
MCV: 91 fL (ref 79–97)
Monocytes Absolute: 0.5 10*3/uL (ref 0.1–0.9)
Monocytes: 6 %
Neutrophils Absolute: 4.3 10*3/uL (ref 1.4–7.0)
Neutrophils: 58 %
Platelets: 336 10*3/uL (ref 150–450)
RBC: 4.81 x10E6/uL (ref 3.77–5.28)
RDW: 13.4 % (ref 11.7–15.4)
WBC: 7.4 10*3/uL (ref 3.4–10.8)

## 2022-11-03 LAB — TSH+FREE T4
Free T4: 1.34 ng/dL (ref 0.82–1.77)
TSH: 2.19 u[IU]/mL (ref 0.450–4.500)

## 2022-11-03 LAB — LIPID PANEL WITH LDL/HDL RATIO
Cholesterol, Total: 211 mg/dL — ABNORMAL HIGH (ref 100–199)
HDL: 50 mg/dL
LDL Chol Calc (NIH): 132 mg/dL — ABNORMAL HIGH (ref 0–99)
LDL/HDL Ratio: 2.6 ratio (ref 0.0–3.2)
Triglycerides: 164 mg/dL — ABNORMAL HIGH (ref 0–149)
VLDL Cholesterol Cal: 29 mg/dL (ref 5–40)

## 2022-11-03 LAB — VITAMIN D 25 HYDROXY (VIT D DEFICIENCY, FRACTURES): Vit D, 25-Hydroxy: 29.3 ng/mL — ABNORMAL LOW (ref 30.0–100.0)

## 2022-11-03 LAB — B12 AND FOLATE PANEL
Folate: 10.4 ng/mL
Vitamin B-12: 385 pg/mL (ref 232–1245)

## 2022-11-03 LAB — IRON,TIBC AND FERRITIN PANEL
Ferritin: 82 ng/mL (ref 15–150)
Iron Saturation: 25 % (ref 15–55)
Iron: 87 ug/dL (ref 27–139)
Total Iron Binding Capacity: 354 ug/dL (ref 250–450)
UIBC: 267 ug/dL (ref 118–369)

## 2022-11-16 ENCOUNTER — Ambulatory Visit (INDEPENDENT_AMBULATORY_CARE_PROVIDER_SITE_OTHER): Payer: Medicare HMO | Admitting: Physician Assistant

## 2022-11-16 ENCOUNTER — Encounter: Payer: Self-pay | Admitting: Physician Assistant

## 2022-11-16 VITALS — BP 144/80 | HR 80 | Temp 97.8°F | Resp 16 | Ht 68.0 in | Wt 180.0 lb

## 2022-11-16 DIAGNOSIS — I10 Essential (primary) hypertension: Secondary | ICD-10-CM

## 2022-11-16 DIAGNOSIS — N1832 Chronic kidney disease, stage 3b: Secondary | ICD-10-CM | POA: Diagnosis not present

## 2022-11-16 DIAGNOSIS — F330 Major depressive disorder, recurrent, mild: Secondary | ICD-10-CM | POA: Diagnosis not present

## 2022-11-16 NOTE — Progress Notes (Signed)
[facility] [address] [place], [place] [address]  Internal MEDICINE  Office Visit Note  Patient Name: Shannon Li  [id number]  [id number]  Date of Service: 11/26/2022  Chief Complaint  Patient presents with   Follow-up    BP    HPI Pt is here for routine follow up -Labs reviewed: Improving, --needs vit D and B12--wants to do oral dosing -BP a little elevated, had to cut hydrochlorothiazide in half due to dizziness.will go back to ziac instead as BP did well on this -Tired, sleepy during the day. Has been tested for OSA a few times, but could not go to sleep last time. -thinks cymbalta is not helping and may be making her more tired too. Would like to taper off and will drop down to 1 cap per day for a week then every other day if still wanting to taper further  Current Medication: Outpatient Encounter Medications as of 11/16/2022  Medication Sig   amLODipine (NORVASC) 5 MG tablet TAKE 1 TABLET (5 MG TOTAL) BY MOUTH DAILY.   bisoprolol-hydrochlorothiazide (ZIAC) 2.5-6.25 MG tablet TAKE 1 TABLET BY MOUTH EVERY DAY FOR BLOOD PRESSURE   DULoxetine (CYMBALTA) 20 MG capsule Take 1 capsule (20 mg total) by mouth 2 (two) times daily.   ezetimibe (ZETIA) 10 MG tablet TAKE 1 TABLET BY MOUTH EVERY DAY   famotidine (PEPCID) 40 MG tablet Take 40 mg by mouth daily.   fluticasone furoate-vilanterol (BREO ELLIPTA) 100-25 MCG/ACT AEPB Inhale 1 puff into the lungs daily.   hydrochlorothiazide (HYDRODIURIL) 12.5 MG tablet Take 1 tablet (12.5 mg total) by mouth daily.   pantoprazole (PROTONIX) 40 MG tablet TAKE 1 TABLET BY MOUTH TWICE A DAY   No facility-administered encounter medications on file as of 11/16/2022.    Surgical History: Past Surgical History:  Procedure Laterality Date   ABSCESS DRAINAGE  11/17/2013   buttock right   BREAST BIOPSY Right    neg   CATARACT EXTRACTION W/PHACO Right 08/17/2019   Procedure: CATARACT EXTRACTION PHACO AND INTRAOCULAR LENS PLACEMENT  (IOC) RIGHT VISION BLUE 5.32  00:42.7;  Surgeon: Elliot Cousin, MD;  Location: [facility];  Service: Ophthalmology;  Laterality: Right;   CATARACT EXTRACTION W/PHACO Left 09/21/2019   Procedure: CATARACT EXTRACTION PHACO AND INTRAOCULAR LENS PLACEMENT (IOC) LEFT VISION BLUE;  Surgeon: Elliot Cousin, MD;  Location: [facility];  Service: Ophthalmology;  Laterality: Left;  6.24 0:40.6   CHOLECYSTECTOMY  2008   COLONOSCOPY  2014   [facility] in [place]   COLONOSCOPY WITH PROPOFOL N/A 08/26/2015   Procedure: COLONOSCOPY WITH PROPOFOL;  Surgeon: Wallace Cullens, MD;  Location: [facility] END[facility]COPY;  Service: Gastroenterology;  Laterality: N/A;   COLONOSCOPY WITH PROPOFOL N/A 03/03/2021   Procedure: COLONOSCOPY WITH PROPOFOL;  Surgeon: Regis Bill, MD;  Location: [facility] ENDOSCOPY;  Service: Endoscopy;  Laterality: N/A;   DILATION AND CURETTAGE OF UTERUS     ESOPHAGOGASTRODUODENOSCOPY N/A 11/15/2014   Procedure: ESOPHAGOGASTRODUODENOSCOPY (EGD);  Surgeon: Wallace Cullens, MD;  Location: [facility];  Service: Gastroenterology;  Laterality: N/A;   ESOPHAGOGASTRODUODENOSCOPY (EGD) WITH PROPOFOL N/A 03/03/2021   Procedure: ESOPHAGOGASTRODUODENOSCOPY (EGD) WITH PROPOFOL;  Surgeon: Regis Bill, MD;  Location: [facility] ENDOSCOPY;  Service: Endoscopy;  Laterality: N/A;   EYE SURGERY     GASTROSTOMY     UPPER GI ENDOSCOPY  2014   [facility] in [place]    Medical History: Past Medical History:  Diagnosis Date   Anxiety    Arthritis  Atrophic kidney    Chronic constipation    Colon polyp 2013   COPD (chronic obstructive pulmonary disease) (HCC)    Depression    Diverticulosis    Esophageal stricture    Esophagitis    Fibrocystic breast disease    GERD (gastroesophageal reflux disease)    History of hiatal hernia    Hypertension    IBS (irritable bowel syndrome)    Menopause    Ovarian cyst    Perirectal abscess    Plantar fasciitis    Postmenopausal    Renal  artery occlusion (HCC)    Renal insufficiency    Right kidney is small and not functioning.    Family History: Family History  Problem Relation Age of Onset   Heart disease Mother    Heart attack Mother    Diabetes Mother    Alzheimer's disease Father    Breast cancer Sister 86   Lung cancer Brother    Brain cancer Brother    Lung cancer Sister    Bladder Cancer Sister    Lung cancer Brother    Leukemia Brother    Diabetes Brother     Social History   Socioeconomic History   Marital status: Widowed    Spouse name: Not on file   Number of children: Not on file   Years of education: Not on file   Highest education level: Not on file  Occupational History   Not on file  Tobacco Use   Smoking status: Every Day    Current packs/day: 0.50    Average packs/day: 0.5 packs/day for 30.0 years (15.0 ttl pk-yrs)    Types: Cigarettes   Smokeless tobacco: Never   Tobacco comments:    1/2 Pack Daily  Vaping Use   Vaping status: Never Used  Substance and Sexual Activity   Alcohol use: No   Drug use: No   Sexual activity: Not on file  Other Topics Concern   Not on file  Social History Narrative   Not on file   Social Determinants of Health   Financial Resource Strain: Low Risk  (10/15/2020)   Overall Financial Resource Strain (CARDIA)    Difficulty of Paying Living Expenses: Not very hard  Food Insecurity: Not on file  Transportation Needs: Not on file  Physical Activity: Not on file  Stress: Not on file  Social Connections: Not on file  Intimate Partner Violence: Not on file      Review of Systems  Constitutional:  Positive for fatigue. Negative for chills and unexpected weight change.  HENT:  Negative for congestion, postnasal drip, rhinorrhea, sneezing and sore throat.   Eyes:  Negative for redness.  Respiratory:  Negative for cough, chest tightness and shortness of breath.   Cardiovascular:  Negative for chest pain and palpitations.  Gastrointestinal:   Negative for constipation, diarrhea, nausea and vomiting.  Genitourinary:  Negative for dysuria and frequency.  Musculoskeletal:  Negative for arthralgias, back pain, joint swelling and neck pain.  Skin:  Negative for rash.  Neurological: Negative.  Negative for tremors and numbness.  Hematological:  Negative for adenopathy. Does not bruise/bleed easily.  Psychiatric/Behavioral:  Positive for sleep disturbance. Negative for behavioral problems (Depression) and suicidal ideas. The patient is not nervous/anxious.     Vital Signs: BP (!) 144/80   Pulse 80   Temp 97.8 F (36.6 C)   Resp 16   Ht 5\' 8"  (1.727 m)   Wt 180 lb (81.6 kg)   SpO2 96%  BMI 27.37 kg/m    Physical Exam Vitals and nursing note reviewed.  Constitutional:      General: She is not in acute distress.    Appearance: Normal appearance. She is well-developed and normal weight. She is not diaphoretic.  HENT:     Head: Normocephalic and atraumatic.     Mouth/Throat:     Pharynx: No oropharyngeal exudate.  Eyes:     Pupils: Pupils are equal, round, and reactive to light.  Neck:     Thyroid: No thyromegaly.     Vascular: No JVD.     Trachea: No tracheal deviation.  Cardiovascular:     Rate and Rhythm: Normal rate and regular rhythm.     Heart sounds: Normal heart sounds. No murmur heard.    No friction rub. No gallop.  Pulmonary:     Effort: Pulmonary effort is normal. No respiratory distress.     Breath sounds: No wheezing or rales.  Chest:     Chest wall: No tenderness.  Abdominal:     General: Bowel sounds are normal.     Palpations: Abdomen is soft.  Musculoskeletal:        General: Normal range of motion.     Cervical back: Normal range of motion and neck supple.  Lymphadenopathy:     Cervical: No cervical adenopathy.  Skin:    General: Skin is warm and dry.  Neurological:     Mental Status: She is alert and oriented to person, place, and time.     Cranial Nerves: No cranial nerve deficit.   Psychiatric:        Behavior: Behavior normal.        Thought Content: Thought content normal.        Judgment: Judgment normal.        Assessment/Plan: 1. Essential hypertension Borderline elevated on 1/2 hydrochlorothiazide alone (dizzy on full dose) and will d/c and go back to ziac  2. Mild episode of recurrent major depressive disorder (HCC) Will taper cymbalta to 1 cap daily then to every other day if further tapering desired  3. Stage 3b chronic kidney disease (HCC) Improved again after recent worsening, continue to monitor   General Counseling: areal kohtz understanding of the findings of todays visit and agrees with plan of treatment. I have discussed any further diagnostic evaluation that may be needed or ordered today. We also reviewed her medications today. she has been encouraged to call the office with any questions or concerns that should arise related to todays visit.    No orders of the defined types were placed in this encounter.   No orders of the defined types were placed in this encounter.   This patient was seen by Lynn Ito, PA-C in collaboration with Dr. Beverely Risen as a part of collaborative care agreement.   Total time spent:30 Minutes Time spent includes review of chart, medications, test results, and follow up plan with the patient.      Dr Lyndon Code Internal medicine

## 2022-12-01 ENCOUNTER — Ambulatory Visit
Admission: RE | Admit: 2022-12-01 | Discharge: 2022-12-01 | Disposition: A | Discharge status: Home / Self Care | Payer: Medicare HMO | Source: Ambulatory Visit | Attending: Physician Assistant | Admitting: Physician Assistant

## 2022-12-01 DIAGNOSIS — Z1231 Encounter for screening mammogram for malignant neoplasm of breast: Secondary | ICD-10-CM | POA: Diagnosis present

## 2022-12-10 ENCOUNTER — Encounter: Payer: Self-pay | Admitting: Physician Assistant

## 2022-12-10 ENCOUNTER — Ambulatory Visit (INDEPENDENT_AMBULATORY_CARE_PROVIDER_SITE_OTHER): Payer: Medicare HMO | Admitting: Physician Assistant

## 2022-12-10 VITALS — BP 135/70 | HR 65 | Temp 97.6°F | Resp 16 | Ht 68.0 in | Wt 178.6 lb

## 2022-12-10 DIAGNOSIS — K219 Gastro-esophageal reflux disease without esophagitis: Secondary | ICD-10-CM | POA: Diagnosis not present

## 2022-12-10 DIAGNOSIS — I10 Essential (primary) hypertension: Secondary | ICD-10-CM

## 2022-12-10 DIAGNOSIS — J432 Centrilobular emphysema: Secondary | ICD-10-CM | POA: Diagnosis not present

## 2022-12-10 DIAGNOSIS — F17219 Nicotine dependence, cigarettes, with unspecified nicotine-induced disorders: Secondary | ICD-10-CM | POA: Diagnosis not present

## 2022-12-10 DIAGNOSIS — F330 Major depressive disorder, recurrent, mild: Secondary | ICD-10-CM

## 2022-12-10 MED ORDER — PANTOPRAZOLE SODIUM 40 MG PO TBEC: DELAYED_RELEASE_TABLET | ORAL | 1 refills | Status: DC

## 2022-12-10 NOTE — Progress Notes (Signed)
[facility] [address] [place], [place] [address]  Internal MEDICINE  Office Visit Note  Patient Name: Shannon Li  [id number]  [id number]  Date of Service: 12/18/2022  Chief Complaint  Patient presents with   Follow-up   Depression   Gastroesophageal Reflux   Hypertension    HPI Pt is here follow up -Has decreased cymbalta to once daily. Doing fine with this. Does want to keep decreasing and will switch every other day now to taper off of this -Taking pantoprazole daily, not BID -doing well with previous ziac dosing and BP improved -Due for CT chest screening  Current Medication: Outpatient Encounter Medications as of 12/10/2022  Medication Sig   amLODipine (NORVASC) 5 MG tablet TAKE 1 TABLET (5 MG TOTAL) BY MOUTH DAILY.   bisoprolol-hydrochlorothiazide (ZIAC) 2.5-6.25 MG tablet TAKE 1 TABLET BY MOUTH EVERY DAY FOR BLOOD PRESSURE   DULoxetine (CYMBALTA) 20 MG capsule Take 1 capsule (20 mg total) by mouth 2 (two) times daily.   ezetimibe (ZETIA) 10 MG tablet TAKE 1 TABLET BY MOUTH EVERY DAY   famotidine (PEPCID) 40 MG tablet Take 40 mg by mouth daily.   fluticasone furoate-vilanterol (BREO ELLIPTA) 100-25 MCG/ACT AEPB Inhale 1 puff into the lungs daily.   [DISCONTINUED] hydrochlorothiazide (HYDRODIURIL) 12.5 MG tablet Take 1 tablet (12.5 mg total) by mouth daily.   [DISCONTINUED] pantoprazole (PROTONIX) 40 MG tablet TAKE 1 TABLET BY MOUTH TWICE A DAY   pantoprazole (PROTONIX) 40 MG tablet TAKE 1 TABLET BY MOUTH DAILY   No facility-administered encounter medications on file as of 12/10/2022.    Surgical History: Past Surgical History:  Procedure Laterality Date   ABSCESS DRAINAGE  11/17/2013   buttock right   BREAST BIOPSY Right    neg   CATARACT EXTRACTION W/PHACO Right 08/17/2019   Procedure: CATARACT EXTRACTION PHACO AND INTRAOCULAR LENS PLACEMENT (IOC) RIGHT VISION BLUE 5.32  00:42.7;  Surgeon: Elliot Cousin, MD;  Location: [facility];   Service: Ophthalmology;  Laterality: Right;   CATARACT EXTRACTION W/PHACO Left 09/21/2019   Procedure: CATARACT EXTRACTION PHACO AND INTRAOCULAR LENS PLACEMENT (IOC) LEFT VISION BLUE;  Surgeon: Elliot Cousin, MD;  Location: [facility];  Service: Ophthalmology;  Laterality: Left;  6.24 0:40.6   CHOLECYSTECTOMY  2008   COLONOSCOPY  2014   [facility] in [place]   COLONOSCOPY WITH PROPOFOL N/A 08/26/2015   Procedure: COLONOSCOPY WITH PROPOFOL;  Surgeon: Wallace Cullens, MD;  Location: [facility] ENDOSCOPY;  Service: Gastroenterology;  Laterality: N/A;   COLONOSCOPY WITH PROPOFOL N/A 03/03/2021   Procedure: COLONOSCOPY WITH PROPOFOL;  Surgeon: Regis Bill, MD;  Location: [facility] ENDOSCOPY;  Service: Endoscopy;  Laterality: N/A;   DILATION AND CURETTAGE OF UTERUS     ESOPHAGOGASTRODUODENOSCOPY N/A 11/15/2014   Procedure: ESOPHAGOGASTRODUODENOSCOPY (EGD);  Surgeon: Wallace Cullens, MD;  Location: [facility] ENDOSCOPY;  Service: Gastroenterology;  Laterality: N/A;   ESOPHAGOGASTRODUODENOSCOPY (EGD) WITH PROPOFOL N/A 03/03/2021   Procedure: ESOPHAGOGASTRODUODENOSCOPY (EGD) WITH PROPOFOL;  Surgeon: Regis Bill, MD;  Location: [facility] ENDOSCOPY;  Service: Endoscopy;  Laterality: N/A;   EYE SURGERY     GASTROSTOMY     UPPER GI ENDOSCOPY  2014   [facility] in [place]    Medical History: Past Medical History:  Diagnosis Date   Anxiety    Arthritis    Atrophic kidney    Chronic constipation    Colon polyp 2013   COPD (chronic obstructive pulmonary disease) (HCC)    Depression    Diverticulosis  Esophageal stricture    Esophagitis    Fibrocystic breast disease    GERD (gastroesophageal reflux disease)    History of hiatal hernia    Hypertension    IBS (irritable bowel syndrome)    Menopause    Ovarian cyst    Perirectal abscess    Plantar fasciitis    Postmenopausal    Renal artery occlusion (HCC)    Renal insufficiency    Right kidney is small and not functioning.     Family History: Family History  Problem Relation Age of Onset   Heart disease Mother    Heart attack Mother    Diabetes Mother    Alzheimer's disease Father    Breast cancer Sister 65   Lung cancer Brother    Brain cancer Brother    Lung cancer Sister    Bladder Cancer Sister    Lung cancer Brother    Leukemia Brother    Diabetes Brother     Social History   Socioeconomic History   Marital status: Widowed    Spouse name: Not on file   Number of children: Not on file   Years of education: Not on file   Highest education level: Not on file  Occupational History   Not on file  Tobacco Use   Smoking status: Every Day    Current packs/day: 0.50    Average packs/day: 0.5 packs/day for 30.0 years (15.0 ttl pk-yrs)    Types: Cigarettes   Smokeless tobacco: Never   Tobacco comments:    1/2 Pack Daily  Vaping Use   Vaping status: Never Used  Substance and Sexual Activity   Alcohol use: No   Drug use: No   Sexual activity: Not on file  Other Topics Concern   Not on file  Social History Narrative   Not on file   Social Determinants of Health   Financial Resource Strain: Low Risk  (10/15/2020)   Overall Financial Resource Strain (CARDIA)    Difficulty of Paying Living Expenses: Not very hard  Food Insecurity: Not on file  Transportation Needs: Not on file  Physical Activity: Not on file  Stress: Not on file  Social Connections: Not on file  Intimate Partner Violence: Not on file      Review of Systems  Constitutional:  Positive for fatigue. Negative for chills and unexpected weight change.  HENT:  Negative for congestion, postnasal drip, rhinorrhea, sneezing and sore throat.   Eyes:  Negative for redness.  Respiratory:  Negative for cough, chest tightness and shortness of breath.   Cardiovascular:  Negative for chest pain and palpitations.  Gastrointestinal:  Negative for constipation, diarrhea, nausea and vomiting.  Genitourinary:  Negative for dysuria and  frequency.  Musculoskeletal:  Negative for arthralgias, back pain, joint swelling and neck pain.  Skin:  Negative for rash.  Neurological: Negative.  Negative for tremors and numbness.  Hematological:  Negative for adenopathy. Does not bruise/bleed easily.  Psychiatric/Behavioral:  Negative for behavioral problems (Depression) and suicidal ideas. The patient is not nervous/anxious.     Vital Signs: BP 135/70   Pulse 65   Temp 97.6 F (36.4 C)   Resp 16   Ht 5\' 8"  (1.727 m)   Wt 178 lb 9.6 oz (81 kg)   SpO2 94%   BMI 27.16 kg/m    Physical Exam Vitals and nursing note reviewed.  Constitutional:      General: She is not in acute distress.    Appearance: Normal appearance. She  is well-developed and normal weight. She is not diaphoretic.  HENT:     Head: Normocephalic and atraumatic.     Mouth/Throat:     Pharynx: No oropharyngeal exudate.  Eyes:     Pupils: Pupils are equal, round, and reactive to light.  Neck:     Thyroid: No thyromegaly.     Vascular: No JVD.     Trachea: No tracheal deviation.  Cardiovascular:     Rate and Rhythm: Normal rate and regular rhythm.     Heart sounds: Normal heart sounds. No murmur heard.    No friction rub. No gallop.  Pulmonary:     Effort: Pulmonary effort is normal. No respiratory distress.     Breath sounds: No wheezing or rales.  Chest:     Chest wall: No tenderness.  Abdominal:     General: Bowel sounds are normal.     Palpations: Abdomen is soft.  Musculoskeletal:        General: Normal range of motion.     Cervical back: Normal range of motion and neck supple.  Lymphadenopathy:     Cervical: No cervical adenopathy.  Skin:    General: Skin is warm and dry.  Neurological:     Mental Status: She is alert and oriented to person, place, and time.     Cranial Nerves: No cranial nerve deficit.  Psychiatric:        Behavior: Behavior normal.        Thought Content: Thought content normal.        Judgment: Judgment normal.         Assessment/Plan: 1. Essential hypertension Improved, continue medications as before  2. Mild episode of recurrent major depressive disorder [facility]) May continue to taper off cymbalta as symptoms stable  3. Gastroesophageal reflux disease without esophagitis - pantoprazole (PROTONIX) 40 MG tablet; TAKE 1 TABLET BY MOUTH DAILY  Dispense: 90 tablet; Refill: 1  4. Cigarette nicotine dependence with nicotine-induced disorder - CT CHEST LUNG CA SCREEN LOW DOSE W/O CM; Future  5. Centrilobular emphysema (HCC) - CT CHEST LUNG CA SCREEN LOW DOSE W/O CM; Future   General Counseling: Shannon Li understanding of the findings of todays visit and agrees with plan of treatment. I have discussed any further diagnostic evaluation that may be needed or ordered today. We also reviewed her medications today. she has been encouraged to call the office with any questions or concerns that should arise related to todays visit.    Orders Placed This Encounter  Procedures   CT CHEST LUNG CA SCREEN LOW DOSE W/O CM    Meds ordered this encounter  Medications   pantoprazole (PROTONIX) 40 MG tablet    Sig: TAKE 1 TABLET BY MOUTH DAILY    Dispense:  90 tablet    Refill:  1    This patient was seen by Lynn Ito, PA-C in collaboration with Dr. Beverely Risen as a part of collaborative care agreement.   Total time spent:30 Minutes Time spent includes review of chart, medications, test results, and follow up plan with the patient.      Dr Lyndon Code Internal medicine

## 2022-12-14 ENCOUNTER — Ambulatory Visit
Admission: RE | Admit: 2022-12-14 | Discharge: 2022-12-14 | Disposition: A | Discharge status: Home / Self Care | Payer: Medicare HMO | Source: Ambulatory Visit | Attending: Physician Assistant | Admitting: Physician Assistant

## 2022-12-14 DIAGNOSIS — F17219 Nicotine dependence, cigarettes, with unspecified nicotine-induced disorders: Secondary | ICD-10-CM

## 2022-12-14 DIAGNOSIS — J432 Centrilobular emphysema: Secondary | ICD-10-CM

## 2022-12-14 DIAGNOSIS — F17210 Nicotine dependence, cigarettes, uncomplicated: Secondary | ICD-10-CM | POA: Diagnosis not present

## 2022-12-22 ENCOUNTER — Other Ambulatory Visit: Payer: Self-pay | Admitting: Physician Assistant

## 2022-12-22 DIAGNOSIS — K219 Gastro-esophageal reflux disease without esophagitis: Secondary | ICD-10-CM

## 2022-12-24 ENCOUNTER — Encounter: Payer: Self-pay | Admitting: Physician Assistant

## 2022-12-24 ENCOUNTER — Ambulatory Visit (INDEPENDENT_AMBULATORY_CARE_PROVIDER_SITE_OTHER): Payer: Medicare HMO | Admitting: Physician Assistant

## 2022-12-24 ENCOUNTER — Other Ambulatory Visit: Payer: Self-pay | Admitting: Physician Assistant

## 2022-12-24 VITALS — BP 139/72 | HR 60 | Temp 97.8°F | Resp 16 | Ht 68.0 in | Wt 176.0 lb

## 2022-12-24 DIAGNOSIS — F17219 Nicotine dependence, cigarettes, with unspecified nicotine-induced disorders: Secondary | ICD-10-CM | POA: Diagnosis not present

## 2022-12-24 DIAGNOSIS — K219 Gastro-esophageal reflux disease without esophagitis: Secondary | ICD-10-CM | POA: Diagnosis not present

## 2022-12-24 DIAGNOSIS — E782 Mixed hyperlipidemia: Secondary | ICD-10-CM

## 2022-12-24 DIAGNOSIS — J432 Centrilobular emphysema: Secondary | ICD-10-CM | POA: Diagnosis not present

## 2022-12-24 DIAGNOSIS — G720 Drug-induced myopathy: Secondary | ICD-10-CM

## 2022-12-24 DIAGNOSIS — I2510 Atherosclerotic heart disease of native coronary artery without angina pectoris: Secondary | ICD-10-CM

## 2022-12-24 DIAGNOSIS — I700 Atherosclerosis of aorta: Secondary | ICD-10-CM

## 2022-12-24 MED ORDER — PANTOPRAZOLE SODIUM 40 MG PO TBEC: DELAYED_RELEASE_TABLET | ORAL | 1 refills | Status: DC

## 2022-12-24 NOTE — Progress Notes (Signed)
[facility] [address] [place], [place] [address]  Internal MEDICINE  Office Visit Note  Patient Name: Shannon Li  [id number]  [id number]  Date of Service: 12/24/2022  Chief Complaint  Patient presents with   Follow-up    CT    HPI Pt is here for routine follow up to review annual CT chest lung cancer screening -Smoking less than 1/2ppd now -taking cymbalta every other day now and is doing well with this, plans to stop completely -Ct findings similar to last year. Continues to show COPD, 3 vessel CAD and aortic atherosclerosis. She has been seen by cardiology in the past and they are aware of these findings from previous CT scan last year. Did discuss that ct chest cannot specify severity of coronary calcification. She states stress testing with cardiology went well last year. She does not have any CP. She has not had any follow up in awhile and may need to schedule this. Did discuss smoking cessation would be the best thing for her. She cannot tolerate statins and is on Zetia.  CT chest screening 12/14/22: IMPRESSION: 1. Lung-RADS 2S, benign appearance or behavior. Continue annual screening with low-dose chest CT without contrast in 12 months. 2. The "S" modifier above refers to potentially clinically significant non lung cancer related findings. Specifically, there is aortic atherosclerosis, in addition to three-vessel coronary artery disease. Please note that although the presence of coronary artery calcium documents the presence of coronary artery disease, the severity of this disease and any potential stenosis cannot be assessed on this non-gated CT examination. Assessment for potential risk factor modification, dietary therapy or pharmacologic therapy may be warranted, if clinically indicated. 3. Mild diffuse bronchial wall thickening with mild centrilobular and paraseptal emphysema; imaging findings suggestive of underlying COPD.   Aortic  Atherosclerosis (ICD10-I70.0) and Emphysema (ICD10-J43.9).    Current Medication: Outpatient Encounter Medications as of 12/24/2022  Medication Sig   amLODipine (NORVASC) 5 MG tablet TAKE 1 TABLET (5 MG TOTAL) BY MOUTH DAILY.   bisoprolol-hydrochlorothiazide (ZIAC) 2.5-6.25 MG tablet TAKE 1 TABLET BY MOUTH EVERY DAY FOR BLOOD PRESSURE   DULoxetine (CYMBALTA) 20 MG capsule Take 1 capsule (20 mg total) by mouth 2 (two) times daily.   ezetimibe (ZETIA) 10 MG tablet TAKE 1 TABLET BY MOUTH EVERY DAY   famotidine (PEPCID) 40 MG tablet Take 40 mg by mouth daily.   fluticasone furoate-vilanterol (BREO ELLIPTA) 100-25 MCG/ACT AEPB Inhale 1 puff into the lungs daily.   [DISCONTINUED] pantoprazole (PROTONIX) 40 MG tablet TAKE 1 TABLET BY MOUTH DAILY   pantoprazole (PROTONIX) 40 MG tablet TAKE 1 TABLET BY MOUTH DAILY   No facility-administered encounter medications on file as of 12/24/2022.    Surgical History: Past Surgical History:  Procedure Laterality Date   ABSCESS DRAINAGE  11/17/2013   buttock right   BREAST BIOPSY Right    neg   CATARACT EXTRACTION W/PHACO Right 08/17/2019   Procedure: CATARACT EXTRACTION PHACO AND INTRAOCULAR LENS PLACEMENT (IOC) RIGHT VISION BLUE 5.32  00:42.7;  Surgeon: Elliot Cousin, MD;  Location: [facility];  Service: Ophthalmology;  Laterality: Right;   CATARACT EXTRACTION W/PHACO Left 09/21/2019   Procedure: CATARACT EXTRACTION PHACO AND INTRAOCULAR LENS PLACEMENT (IOC) LEFT VISION BLUE;  Surgeon: Elliot Cousin, MD;  Location: [facility];  Service: Ophthalmology;  Laterality: Left;  6.24 0:40.6   CHOLECYSTECTOMY  2008   COLONOSCOPY  2014   [facility] in [place]   COLONOSCOPY WITH PROPOFOL N/A 08/26/2015   Procedure: COLONOSCOPY  WITH PROPOFOL;  Surgeon: Wallace Cullens, MD;  Location: [facility] END[facility]COPY;  Service: Gastroenterology;  Laterality: N/A;   COLONOSCOPY WITH PROPOFOL N/A 03/03/2021   Procedure: COLONOSCOPY WITH PROPOFOL;  Surgeon:  Regis Bill, MD;  Location: [facility] ENDOSCOPY;  Service: Endoscopy;  Laterality: N/A;   DILATION AND CURETTAGE OF UTERUS     ESOPHAGOGASTRODUODENOSCOPY N/A 11/15/2014   Procedure: ESOPHAGOGASTRODUODENOSCOPY (EGD);  Surgeon: Wallace Cullens, MD;  Location: [facility] ENDOSCOPY;  Service: Gastroenterology;  Laterality: N/A;   ESOPHAGOGASTRODUODENOSCOPY (EGD) WITH PROPOFOL N/A 03/03/2021   Procedure: ESOPHAGOGASTRODUODENOSCOPY (EGD) WITH PROPOFOL;  Surgeon: Regis Bill, MD;  Location: [facility] ENDOSCOPY;  Service: Endoscopy;  Laterality: N/A;   EYE SURGERY     GASTROSTOMY     UPPER GI ENDOSCOPY  2014   [facility] in [place]    Medical History: Past Medical History:  Diagnosis Date   Anxiety    Arthritis    Atrophic kidney    Chronic constipation    Colon polyp 2013   COPD (chronic obstructive pulmonary disease) (HCC)    Depression    Diverticulosis    Esophageal stricture    Esophagitis    Fibrocystic breast disease    GERD (gastroesophageal reflux disease)    History of hiatal hernia    Hypertension    IBS (irritable bowel syndrome)    Menopause    Ovarian cyst    Perirectal abscess    Plantar fasciitis    Postmenopausal    Renal artery occlusion (HCC)    Renal insufficiency    Right kidney is small and not functioning.    Family History: Family History  Problem Relation Age of Onset   Heart disease Mother    Heart attack Mother    Diabetes Mother    Alzheimer's disease Father    Breast cancer Sister 3   Lung cancer Brother    Brain cancer Brother    Lung cancer Sister    Bladder Cancer Sister    Lung cancer Brother    Leukemia Brother    Diabetes Brother     Social History   Socioeconomic History   Marital status: Widowed    Spouse name: Not on file   Number of children: Not on file   Years of education: Not on file   Highest education level: Not on file  Occupational History   Not on file  Tobacco Use   Smoking status: Every Day    Current  packs/day: 0.50    Average packs/day: 0.5 packs/day for 30.0 years (15.0 ttl pk-yrs)    Types: Cigarettes   Smokeless tobacco: Never   Tobacco comments:    1/2 Pack Daily  Vaping Use   Vaping status: Never Used  Substance and Sexual Activity   Alcohol use: No   Drug use: No   Sexual activity: Not on file  Other Topics Concern   Not on file  Social History Narrative   Not on file   Social Determinants of Health   Financial Resource Strain: Low Risk  (10/15/2020)   Overall Financial Resource Strain (CARDIA)    Difficulty of Paying Living Expenses: Not very hard  Food Insecurity: Not on file  Transportation Needs: Not on file  Physical Activity: Not on file  Stress: Not on file  Social Connections: Not on file  Intimate Partner Violence: Not on file      Review of Systems  Constitutional:  Positive for fatigue. Negative for chills and unexpected weight change.  HENT:  Negative for congestion, postnasal drip, rhinorrhea, sneezing and sore throat.   Eyes:  Negative for redness.  Respiratory:  Negative for cough, chest tightness and shortness of breath.   Cardiovascular:  Negative for chest pain and palpitations.  Gastrointestinal:  Negative for constipation, diarrhea, nausea and vomiting.  Genitourinary:  Negative for dysuria and frequency.  Musculoskeletal:  Negative for arthralgias, back pain, joint swelling and neck pain.  Skin:  Negative for rash.  Neurological: Negative.  Negative for tremors and numbness.  Hematological:  Negative for adenopathy. Does not bruise/bleed easily.  Psychiatric/Behavioral:  Negative for behavioral problems (Depression) and suicidal ideas. The patient is not nervous/anxious.     Vital Signs: BP 139/72   Pulse 60   Temp 97.8 F (36.6 C)   Resp 16   Ht 5\' 8"  (1.727 m)   Wt 176 lb (79.8 kg)   SpO2 96%   BMI 26.76 kg/m    Physical Exam Vitals and nursing note reviewed.  Constitutional:      General: She is not in acute distress.     Appearance: Normal appearance. She is well-developed and normal weight. She is not diaphoretic.  HENT:     Head: Normocephalic and atraumatic.     Mouth/Throat:     Pharynx: No oropharyngeal exudate.  Eyes:     Pupils: Pupils are equal, round, and reactive to light.  Neck:     Thyroid: No thyromegaly.     Vascular: No JVD.     Trachea: No tracheal deviation.  Cardiovascular:     Rate and Rhythm: Normal rate and regular rhythm.     Heart sounds: Normal heart sounds. No murmur heard.    No friction rub. No gallop.  Pulmonary:     Effort: Pulmonary effort is normal. No respiratory distress.     Breath sounds: No wheezing or rales.  Chest:     Chest wall: No tenderness.  Abdominal:     General: Bowel sounds are normal.     Palpations: Abdomen is soft.  Musculoskeletal:        General: Normal range of motion.     Cervical back: Normal range of motion and neck supple.  Lymphadenopathy:     Cervical: No cervical adenopathy.  Skin:    General: Skin is warm and dry.  Neurological:     Mental Status: She is alert and oriented to person, place, and time.     Cranial Nerves: No cranial nerve deficit.  Psychiatric:        Behavior: Behavior normal.        Thought Content: Thought content normal.        Judgment: Judgment normal.        Assessment/Plan: 1. Centrilobular emphysema (HCC) Continue breo as before  2. Aortic atherosclerosis (HCC) Unable to take statins, continue zetia and working on diet/exercise  3. Coronary artery calcification seen on CT scan Unable to take statins, continue zetia and working on diet/exercise. Work on smoking cessation and consider cardiology follow up  4. Cigarette nicotine dependence with nicotine-induced disorder Continue to work on smoking cessation  5. Statin myopathy Unable to take statins, continue zetia  6. Gastroesophageal reflux disease without esophagitis - pantoprazole (PROTONIX) 40 MG tablet; TAKE 1 TABLET BY MOUTH DAILY   Dispense: 90 tablet; Refill: 1   General Counseling: kimi mochizuki understanding of the findings of todays visit and agrees with plan of treatment. I have discussed any further diagnostic evaluation that may be needed or ordered today. We  also reviewed her medications today. she has been encouraged to call the office with any questions or concerns that should arise related to todays visit.    No orders of the defined types were placed in this encounter.   Meds ordered this encounter  Medications   pantoprazole (PROTONIX) 40 MG tablet    Sig: TAKE 1 TABLET BY MOUTH DAILY    Dispense:  90 tablet    Refill:  1    This patient was seen by Lynn Ito, PA-C in collaboration with Dr. Beverely Risen as a part of collaborative care agreement.   Total time spent:30 Minutes Time spent includes review of chart, medications, test results, and follow up plan with the patient.      Dr Lyndon Code Internal medicine

## 2023-01-19 DIAGNOSIS — D485 Neoplasm of uncertain behavior of skin: Secondary | ICD-10-CM | POA: Diagnosis not present

## 2023-01-19 DIAGNOSIS — D2271 Melanocytic nevi of right lower limb, including hip: Secondary | ICD-10-CM | POA: Diagnosis not present

## 2023-01-19 DIAGNOSIS — L821 Other seborrheic keratosis: Secondary | ICD-10-CM | POA: Diagnosis not present

## 2023-01-19 DIAGNOSIS — L570 Actinic keratosis: Secondary | ICD-10-CM | POA: Diagnosis not present

## 2023-01-19 DIAGNOSIS — Z85828 Personal history of other malignant neoplasm of skin: Secondary | ICD-10-CM | POA: Diagnosis not present

## 2023-01-19 DIAGNOSIS — D2262 Melanocytic nevi of left upper limb, including shoulder: Secondary | ICD-10-CM | POA: Diagnosis not present

## 2023-01-19 DIAGNOSIS — D0462 Carcinoma in situ of skin of left upper limb, including shoulder: Secondary | ICD-10-CM | POA: Diagnosis not present

## 2023-01-19 DIAGNOSIS — D225 Melanocytic nevi of trunk: Secondary | ICD-10-CM | POA: Diagnosis not present

## 2023-01-19 DIAGNOSIS — D2272 Melanocytic nevi of left lower limb, including hip: Secondary | ICD-10-CM | POA: Diagnosis not present

## 2023-01-19 DIAGNOSIS — D2261 Melanocytic nevi of right upper limb, including shoulder: Secondary | ICD-10-CM | POA: Diagnosis not present

## 2023-02-10 ENCOUNTER — Ambulatory Visit: Payer: Medicare HMO | Admitting: Nurse Practitioner

## 2023-02-10 ENCOUNTER — Encounter: Payer: Self-pay | Admitting: Nurse Practitioner

## 2023-02-10 VITALS — BP 140/74 | HR 71 | Temp 98.4°F | Resp 16 | Ht 68.0 in | Wt 173.6 lb

## 2023-02-10 DIAGNOSIS — R7303 Prediabetes: Secondary | ICD-10-CM

## 2023-02-10 LAB — POCT GLYCOSYLATED HEMOGLOBIN (HGB A1C): Hemoglobin A1C: 5.9 % — AB (ref 4.0–5.6)

## 2023-02-10 NOTE — Progress Notes (Signed)
[facility] [address] [place], [place] [address]  Internal MEDICINE  Office Visit Note  Patient Name: Shannon Li  [id number]  [id number]  Date of Service: 02/10/2023  Chief Complaint  Patient presents with   Acute Visit    Yesterday almost passed out. Sweating head felt full.      HPI Shannon Li presents for an acute sick visit for near-syncope while waiting to vote, had been in line for 20 minutes Had lunch in the past 2 hours or so.  Was sweating, outside in line but not completely in the sun.  Has low back pain chronically but her upper back was hurting this time.  Prior A1c for diabetes -- was 5.8 in 2021.  Denied chest pain or headache.      Current Medication:  Outpatient Encounter Medications as of 02/10/2023  Medication Sig   amLODipine (NORVASC) 5 MG tablet TAKE 1 TABLET (5 MG TOTAL) BY MOUTH DAILY.   bisoprolol-hydrochlorothiazide (ZIAC) 2.5-6.25 MG tablet TAKE 1 TABLET BY MOUTH EVERY DAY FOR BLOOD PRESSURE   DULoxetine (CYMBALTA) 20 MG capsule Take 1 capsule (20 mg total) by mouth 2 (two) times daily.   ezetimibe (ZETIA) 10 MG tablet TAKE 1 TABLET BY MOUTH EVERY DAY   famotidine (PEPCID) 40 MG tablet Take 40 mg by mouth daily.   fluticasone furoate-vilanterol (BREO ELLIPTA) 100-25 MCG/ACT AEPB Inhale 1 puff into the lungs daily.   pantoprazole (PROTONIX) 40 MG tablet TAKE 1 TABLET BY MOUTH DAILY   No facility-administered encounter medications on file as of 02/10/2023.      Medical History: Past Medical History:  Diagnosis Date   Anxiety    Arthritis    Atrophic kidney    Chronic constipation    Colon polyp 2013   COPD (chronic obstructive pulmonary disease) (HCC)    Depression    Diverticulosis    Esophageal stricture    Esophagitis    Fibrocystic breast disease    GERD (gastroesophageal reflux disease)    History of hiatal hernia    Hypertension    IBS (irritable bowel syndrome)    Menopause    Ovarian cyst     Perirectal abscess    Plantar fasciitis    Postmenopausal    Renal artery occlusion (HCC)    Renal insufficiency    Right kidney is small and not functioning.     Vital Signs: BP (!) 140/74   Pulse 71   Temp 98.4 F (36.9 C)   Resp 16   Ht 5\' 8"  (1.727 m)   Wt 173 lb 9.6 oz (78.7 kg)   SpO2 95%   BMI 26.40 kg/m    Review of Systems  Constitutional:  Negative for fatigue.  HENT: Negative.    Respiratory: Negative.  Negative for cough, chest tightness, shortness of breath and wheezing.   Cardiovascular: Negative.  Negative for chest pain and palpitations.  Musculoskeletal: Negative.   Neurological:  Positive for syncope (near syncope).    Physical Exam Constitutional:      General: She is not in acute distress.    Appearance: Normal appearance. She is not ill-appearing.  HENT:     Head: Normocephalic and atraumatic.  Eyes:     Pupils: Pupils are equal, round, and reactive to light.  Cardiovascular:     Rate and Rhythm: Normal rate and regular rhythm.  Pulmonary:     Effort: Pulmonary effort is normal. No respiratory distress.  Neurological:     Mental Status: She is alert.  Psychiatric:        Mood and Affect: Mood normal.        Behavior: Behavior normal.       Assessment/Plan: 1. Prediabetes A1c is prediabetic. This episode may have been an episode of low blood sugar, she felt better after eating and drinking and has not had any more episodes since that day.  - POCT glycosylated hemoglobin (Hb A1C)   General Counseling: dhana giovannetti understanding of the findings of todays visit and agrees with plan of treatment. I have discussed any further diagnostic evaluation that may be needed or ordered today. We also reviewed her medications today. she has been encouraged to call the office with any questions or concerns that should arise related to todays visit.    Counseling:    Orders Placed This Encounter  Procedures   POCT glycosylated hemoglobin  (Hb A1C)    No orders of the defined types were placed in this encounter.   Return if symptoms worsen or fail to improve.  [place] Controlled Substance Database was reviewed by me for overdose risk score (ORS)  Time spent:20 Minutes Time spent with patient included reviewing progress notes, labs, imaging studies, and discussing plan for follow up.   This patient was seen by Sallyanne Kuster, FNP-C in collaboration with Dr. Beverely Risen as a part of collaborative care agreement.  Tylin Stradley R. Tedd Sias, MSN, FNP-C Internal Medicine

## 2023-02-22 DIAGNOSIS — D0462 Carcinoma in situ of skin of left upper limb, including shoulder: Secondary | ICD-10-CM | POA: Diagnosis not present

## 2023-02-25 ENCOUNTER — Encounter: Payer: Self-pay | Admitting: Nurse Practitioner

## 2023-02-25 ENCOUNTER — Ambulatory Visit (INDEPENDENT_AMBULATORY_CARE_PROVIDER_SITE_OTHER): Payer: Medicare HMO | Admitting: Nurse Practitioner

## 2023-02-25 VITALS — BP 136/84 | HR 64 | Temp 98.5°F | Resp 16 | Ht 68.0 in | Wt 175.6 lb

## 2023-02-25 DIAGNOSIS — M4712 Other spondylosis with myelopathy, cervical region: Secondary | ICD-10-CM

## 2023-02-25 DIAGNOSIS — M549 Dorsalgia, unspecified: Secondary | ICD-10-CM | POA: Diagnosis not present

## 2023-02-25 DIAGNOSIS — R937 Abnormal findings on diagnostic imaging of other parts of musculoskeletal system: Secondary | ICD-10-CM

## 2023-02-25 DIAGNOSIS — L2389 Allergic contact dermatitis due to other agents: Secondary | ICD-10-CM

## 2023-02-25 DIAGNOSIS — G8929 Other chronic pain: Secondary | ICD-10-CM

## 2023-02-25 DIAGNOSIS — M5134 Other intervertebral disc degeneration, thoracic region: Secondary | ICD-10-CM

## 2023-02-25 DIAGNOSIS — M4722 Other spondylosis with radiculopathy, cervical region: Secondary | ICD-10-CM

## 2023-02-25 DIAGNOSIS — M5416 Radiculopathy, lumbar region: Secondary | ICD-10-CM

## 2023-02-25 DIAGNOSIS — M542 Cervicalgia: Secondary | ICD-10-CM

## 2023-02-25 MED ORDER — TRIAMCINOLONE ACETONIDE 0.1 % EX CREA: 1.0000 | TOPICAL_CREAM | Freq: Two times a day (BID) | CUTANEOUS | 0 refills | Status: DC

## 2023-02-25 NOTE — Progress Notes (Unsigned)
[facility] [address] [place], [place] [address]  Internal MEDICINE  Office Visit Note  Patient Name: Shannon Li  [id number]  [id number]  Date of Service: 02/25/2023  Chief Complaint  Patient presents with  . Acute Visit    Upper back pain     HPI Vadis presents for an acute sick visit for worsening chronic back pain Has had back   Neck pain Mid back pain Lumbar pain Rash and itching on arms L>R -- raking leaves recently   Current Medication:  Outpatient Encounter Medications as of 02/25/2023  Medication Sig  . triamcinolone cream (KENALOG) 0.1 % Apply 1 Application topically 2 (two) times daily.  Marland Kitchen amLODipine (NORVASC) 5 MG tablet TAKE 1 TABLET (5 MG TOTAL) BY MOUTH DAILY.  . bisoprolol-hydrochlorothiazide (ZIAC) 2.5-6.25 MG tablet TAKE 1 TABLET BY MOUTH EVERY DAY FOR BLOOD PRESSURE  . DULoxetine (CYMBALTA) 20 MG capsule Take 1 capsule (20 mg total) by mouth 2 (two) times daily.  Marland Kitchen ezetimibe (ZETIA) 10 MG tablet TAKE 1 TABLET BY MOUTH EVERY DAY  . famotidine (PEPCID) 40 MG tablet Take 40 mg by mouth daily.  . fluticasone furoate-vilanterol (BREO ELLIPTA) 100-25 MCG/ACT AEPB Inhale 1 puff into the lungs daily.  . pantoprazole (PROTONIX) 40 MG tablet TAKE 1 TABLET BY MOUTH DAILY   No facility-administered encounter medications on file as of 02/25/2023.      Medical History: Past Medical History:  Diagnosis Date  . Anxiety   . Arthritis   . Atrophic kidney   . Chronic constipation   . Colon polyp 2013  . COPD (chronic obstructive pulmonary disease) (HCC)   . Depression   . Diverticulosis   . Esophageal stricture   . Esophagitis   . Fibrocystic breast disease   . GERD (gastroesophageal reflux disease)   . History of hiatal hernia   . Hypertension   . IBS (irritable bowel syndrome)   . Menopause   . Ovarian cyst   . Perirectal abscess   . Plantar fasciitis   . Postmenopausal   . Renal artery occlusion (HCC)   . Renal insufficiency     Right kidney is small and not functioning.     Vital Signs: BP 136/84   Pulse 64   Temp 98.5 F (36.9 C)   Resp 16   Ht 5\' 8"  (1.727 m)   Wt 175 lb 9.6 oz (79.7 kg)   SpO2 97%   BMI 26.70 kg/m    Review of Systems  Physical Exam    Assessment/Plan:   General Counseling: alisyn tacuri understanding of the findings of todays visit and agrees with plan of treatment. I have discussed any further diagnostic evaluation that may be needed or ordered today. We also reviewed her medications today. she has been encouraged to call the office with any questions or concerns that should arise related to todays visit.    Counseling:    Orders Placed This Encounter  Procedures  . MR Lumbar Spine Wo Contrast  . MR Cervical Spine Wo Contrast  . MR Thoracic Spine Wo Contrast    Meds ordered this encounter  Medications  . triamcinolone cream (KENALOG) 0.1 %    Sig: Apply 1 Application topically 2 (two) times daily.    Dispense:  30 g    Refill:  0    Return for need follow up to discuss mri results.  [place] Controlled Substance Database was reviewed by me for overdose risk score (ORS)  Time spent:*** Minutes Time  spent with patient included reviewing progress notes, labs, imaging studies, and discussing plan for follow up.   This patient was seen by Sallyanne Kuster, FNP-C in collaboration with Dr. Beverely Risen as a part of collaborative care agreement.  Rosanne Wohlfarth R. Tedd Sias, MSN, FNP-C Internal Medicine

## 2023-03-04 ENCOUNTER — Encounter: Payer: Self-pay | Admitting: Nurse Practitioner

## 2023-03-13 ENCOUNTER — Other Ambulatory Visit: Payer: Medicare HMO

## 2023-03-28 ENCOUNTER — Encounter: Payer: Self-pay | Admitting: Nurse Practitioner

## 2023-04-26 ENCOUNTER — Ambulatory Visit: Payer: Medicare HMO | Admitting: Physician Assistant

## 2023-04-28 ENCOUNTER — Telehealth: Payer: Self-pay

## 2023-04-28 NOTE — Telephone Encounter (Signed)
Patient notified and will keep us updated.

## 2023-05-03 ENCOUNTER — Encounter: Payer: Self-pay | Admitting: Physician Assistant

## 2023-05-03 ENCOUNTER — Ambulatory Visit (INDEPENDENT_AMBULATORY_CARE_PROVIDER_SITE_OTHER): Payer: Medicare HMO | Admitting: Physician Assistant

## 2023-05-03 VITALS — BP 106/73 | HR 97 | Temp 98.0°F | Resp 16 | Ht 68.0 in | Wt 167.0 lb

## 2023-05-03 DIAGNOSIS — R42 Dizziness and giddiness: Secondary | ICD-10-CM

## 2023-05-03 DIAGNOSIS — J0100 Acute maxillary sinusitis, unspecified: Secondary | ICD-10-CM | POA: Diagnosis not present

## 2023-05-03 DIAGNOSIS — R001 Bradycardia, unspecified: Secondary | ICD-10-CM

## 2023-05-03 DIAGNOSIS — I951 Orthostatic hypotension: Secondary | ICD-10-CM | POA: Diagnosis not present

## 2023-05-03 MED ORDER — DOXYCYCLINE HYCLATE 100 MG PO TABS: 100.0000 mg | ORAL_TABLET | Freq: Two times a day (BID) | ORAL | 0 refills | Status: DC

## 2023-05-03 NOTE — Progress Notes (Signed)
 [facility] [address] [place], [place] [address]  Internal MEDICINE  Office Visit Note  Patient Name: Shannon Li  [id number]  [id number]  Date of Service: 05/11/2023  Chief Complaint  Patient presents with   Acute Visit    Dizziness and fatigue      HPI Pt is here for a sick visit. -been feeling dizzy and fatigued -BP down the past few days now -This morning felt terrible, head feels full. Having sinus congestion, but no breathing concerns. No headache, just full feeling  -had trouble a few days ago focusing, felt too weak to write checks. But this improved. Overall had been improving some until this morning with head feeling so full. States weakness is generalized, not one sided. Pt able to ambulate into office. -tried to go to walk in clinic a few days ago, but they were closed due to bad weather so went back home. -Denies CP -states she is drinking plenty of fluids.  -Turning head and possition changes worse. -brother drove today.   Current Medication:  Outpatient Encounter Medications as of 05/03/2023  Medication Sig Note   amLODipine  (NORVASC ) 5 MG tablet TAKE 1 TABLET (5 MG TOTAL) BY MOUTH DAILY.    doxycycline  (VIBRA -TABS) 100 MG tablet Take 1 tablet (100 mg total) by mouth 2 (two) times daily.    ezetimibe  (ZETIA ) 10 MG tablet TAKE 1 TABLET BY MOUTH EVERY DAY    famotidine (PEPCID) 40 MG tablet Take 40 mg by mouth daily.    fluticasone  furoate-vilanterol (BREO ELLIPTA ) 100-25 MCG/ACT AEPB Inhale 1 puff into the lungs daily.    pantoprazole  (PROTONIX ) 40 MG tablet TAKE 1 TABLET BY MOUTH DAILY    triamcinolone  cream (KENALOG ) 0.1 % Apply 1 Application topically 2 (two) times daily. 05/08/2023: prn   [DISCONTINUED] bisoprolol -hydrochlorothiazide  (ZIAC ) 2.5-6.25 MG tablet TAKE 1 TABLET BY MOUTH EVERY DAY FOR BLOOD PRESSURE    [DISCONTINUED] DULoxetine  (CYMBALTA ) 20 MG capsule Take 1 capsule (20 mg total) by mouth 2 (two) times daily.    No  facility-administered encounter medications on file as of 05/03/2023.      Medical History: Past Medical History:  Diagnosis Date   Anxiety    Arthritis    Atrophic kidney    Chronic constipation    Colon polyp 2013   COPD (chronic obstructive pulmonary disease) (HCC)    Depression    Diverticulosis    Esophageal stricture    Esophagitis    Fibrocystic breast disease    GERD (gastroesophageal reflux disease)    History of hiatal hernia    Hypertension    IBS (irritable bowel syndrome)    Menopause    Ovarian cyst    Perirectal abscess    Plantar fasciitis    Postmenopausal    Renal artery occlusion (HCC)    Renal insufficiency    Right kidney is small and not functioning.     Vital Signs: BP 106/73 (BP Location: Right Arm, Patient Position: Standing)   Pulse 97   Temp 98 F (36.7 C)   Resp 16   Ht 5' 8 (1.727 m)   Wt 167 lb (75.8 kg)   SpO2 96%   BMI 25.39 kg/m    Review of Systems  Constitutional:  Positive for fatigue. Negative for fever.  HENT:  Positive for congestion, postnasal drip and sinus pressure. Negative for mouth sores.   Respiratory:  Negative for cough and shortness of breath.   Cardiovascular:  Negative for chest pain and palpitations.  Gastrointestinal:  Negative for diarrhea and vomiting.  Genitourinary:  Negative for flank pain.  Skin:  Negative for wound.  Neurological:  Positive for dizziness, weakness and light-headedness. Negative for headaches.  Psychiatric/Behavioral: Negative.      Physical Exam Constitutional:      General: She is not in acute distress.    Appearance: Normal appearance. She is ill-appearing.  HENT:     Head: Normocephalic and atraumatic.     Right Ear: Tympanic membrane normal.     Left Ear: Tympanic membrane normal.     Nose: Congestion present.  Eyes:     Pupils: Pupils are equal, round, and reactive to light.  Cardiovascular:     Rate and Rhythm: Normal rate and regular rhythm.  Pulmonary:      Effort: Pulmonary effort is normal. No respiratory distress.  Skin:    General: Skin is warm and dry.  Neurological:     General: No focal deficit present.     Mental Status: She is alert.     Sensory: No sensory deficit.  Psychiatric:        Mood and Affect: Mood normal.        Behavior: Behavior normal.       Assessment/Plan: 1. Orthostatic hypotension (Primary) Will hold Ziac  and monitor BP. Drink plenty of fluids and change position slowly.   2. Bradycardia Will d/c ziac  and will order long term monitor - LONG TERM MONITOR (3-14 DAYS); Future  3. Dizzy Will d/c ziac , increase fluids and eat regularly. Will order long term monitor. Will also treat for possible sinus infection. Pt advised if new or worsening symptoms to go to ED. - EKG 12-Lead--bradycardia  4. Acute non-recurrent maxillary sinusitis Will tx for possible sinus infection that may contribute to symptoms - doxycycline  (VIBRA -TABS) 100 MG tablet; Take 1 tablet (100 mg total) by mouth 2 (two) times daily.  Dispense: 20 tablet; Refill: 0    General Counseling: vicky schleich understanding of the findings of todays visit and agrees with plan of treatment. I have discussed any further diagnostic evaluation that may be needed or ordered today. We also reviewed her medications today. she has been encouraged to call the office with any questions or concerns that should arise related to todays visit.    Counseling:    Orders Placed This Encounter  Procedures   LONG TERM MONITOR (3-14 DAYS)   EKG 12-Lead    Meds ordered this encounter  Medications   doxycycline  (VIBRA -TABS) 100 MG tablet    Sig: Take 1 tablet (100 mg total) by mouth 2 (two) times daily.    Dispense:  20 tablet    Refill:  0    Time spent:35 Minutes

## 2023-05-07 ENCOUNTER — Other Ambulatory Visit: Payer: Self-pay

## 2023-05-07 ENCOUNTER — Observation Stay
Admission: EM | Admit: 2023-05-07 | Discharge: 2023-05-09 | Disposition: A | Discharge status: Home / Self Care | Payer: Medicare HMO | Attending: Internal Medicine | Admitting: Internal Medicine

## 2023-05-07 ENCOUNTER — Emergency Department: Payer: Medicare HMO

## 2023-05-07 ENCOUNTER — Observation Stay: Payer: Medicare HMO

## 2023-05-07 ENCOUNTER — Telehealth: Payer: Self-pay | Admitting: Physician Assistant

## 2023-05-07 DIAGNOSIS — R519 Headache, unspecified: Secondary | ICD-10-CM

## 2023-05-07 DIAGNOSIS — R2681 Unsteadiness on feet: Secondary | ICD-10-CM

## 2023-05-07 DIAGNOSIS — F330 Major depressive disorder, recurrent, mild: Secondary | ICD-10-CM

## 2023-05-07 DIAGNOSIS — I129 Hypertensive chronic kidney disease with stage 1 through stage 4 chronic kidney disease, or unspecified chronic kidney disease: Secondary | ICD-10-CM | POA: Insufficient documentation

## 2023-05-07 DIAGNOSIS — Z79899 Other long term (current) drug therapy: Secondary | ICD-10-CM | POA: Diagnosis not present

## 2023-05-07 DIAGNOSIS — R079 Chest pain, unspecified: Secondary | ICD-10-CM | POA: Diagnosis present

## 2023-05-07 DIAGNOSIS — R29818 Other symptoms and signs involving the nervous system: Secondary | ICD-10-CM | POA: Diagnosis not present

## 2023-05-07 DIAGNOSIS — R42 Dizziness and giddiness: Secondary | ICD-10-CM | POA: Diagnosis not present

## 2023-05-07 DIAGNOSIS — F17210 Nicotine dependence, cigarettes, uncomplicated: Secondary | ICD-10-CM | POA: Insufficient documentation

## 2023-05-07 DIAGNOSIS — I63532 Cerebral infarction due to unspecified occlusion or stenosis of left posterior cerebral artery: Secondary | ICD-10-CM | POA: Diagnosis not present

## 2023-05-07 DIAGNOSIS — F32A Depression, unspecified: Secondary | ICD-10-CM | POA: Insufficient documentation

## 2023-05-07 DIAGNOSIS — J449 Chronic obstructive pulmonary disease, unspecified: Secondary | ICD-10-CM | POA: Insufficient documentation

## 2023-05-07 DIAGNOSIS — R9082 White matter disease, unspecified: Secondary | ICD-10-CM | POA: Diagnosis not present

## 2023-05-07 DIAGNOSIS — N179 Acute kidney failure, unspecified: Principal | ICD-10-CM | POA: Insufficient documentation

## 2023-05-07 DIAGNOSIS — N1832 Chronic kidney disease, stage 3b: Secondary | ICD-10-CM | POA: Insufficient documentation

## 2023-05-07 DIAGNOSIS — K219 Gastro-esophageal reflux disease without esophagitis: Secondary | ICD-10-CM | POA: Diagnosis not present

## 2023-05-07 DIAGNOSIS — R269 Unspecified abnormalities of gait and mobility: Secondary | ICD-10-CM | POA: Insufficient documentation

## 2023-05-07 DIAGNOSIS — Z7901 Long term (current) use of anticoagulants: Secondary | ICD-10-CM | POA: Insufficient documentation

## 2023-05-07 LAB — URINALYSIS, W/ REFLEX TO CULTURE (INFECTION SUSPECTED)
Bilirubin Urine: NEGATIVE
Glucose, UA: NEGATIVE mg/dL
Hgb urine dipstick: NEGATIVE
Ketones, ur: NEGATIVE mg/dL
Leukocytes,Ua: NEGATIVE
Nitrite: NEGATIVE
Protein, ur: NEGATIVE mg/dL
RBC / HPF: 0 RBC/hpf (ref 0–5)
Specific Gravity, Urine: 1.011 (ref 1.005–1.030)
pH: 5 (ref 5.0–8.0)

## 2023-05-07 LAB — LIPID PANEL
Cholesterol: 176 mg/dL (ref 0–200)
HDL: 50 mg/dL
LDL Cholesterol: 107 mg/dL — ABNORMAL HIGH (ref 0–99)
Total CHOL/HDL Ratio: 3.5 ratio
Triglycerides: 95 mg/dL
VLDL: 19 mg/dL (ref 0–40)

## 2023-05-07 LAB — CBC
HCT: 43.4 % (ref 36.0–46.0)
Hemoglobin: 14.7 g/dL (ref 12.0–15.0)
MCH: 30 pg (ref 26.0–34.0)
MCHC: 33.9 g/dL (ref 30.0–36.0)
MCV: 88.6 fL (ref 80.0–100.0)
Platelets: 296 10*3/uL (ref 150–400)
RBC: 4.9 MIL/uL (ref 3.87–5.11)
RDW: 13.6 % (ref 11.5–15.5)
WBC: 7.1 10*3/uL (ref 4.0–10.5)
nRBC: 0 % (ref 0.0–0.2)

## 2023-05-07 LAB — TROPONIN I (HIGH SENSITIVITY)
Troponin I (High Sensitivity): 7 ng/L
Troponin I (High Sensitivity): 6 ng/L

## 2023-05-07 LAB — SEDIMENTATION RATE: Sed Rate: 10 mm/h (ref 0–30)

## 2023-05-07 LAB — BASIC METABOLIC PANEL WITH GFR
Anion gap: 16 — ABNORMAL HIGH (ref 5–15)
BUN: 31 mg/dL — ABNORMAL HIGH (ref 8–23)
CO2: 20 mmol/L — ABNORMAL LOW (ref 22–32)
Calcium: 9.2 mg/dL (ref 8.9–10.3)
Chloride: 99 mmol/L (ref 98–111)
Creatinine, Ser: 2.11 mg/dL — ABNORMAL HIGH (ref 0.44–1.00)
GFR, Estimated: 24 mL/min — ABNORMAL LOW
Glucose, Bld: 119 mg/dL — ABNORMAL HIGH (ref 70–99)
Potassium: 3.8 mmol/L (ref 3.5–5.1)
Sodium: 135 mmol/L (ref 135–145)

## 2023-05-07 MED ORDER — DOXYCYCLINE HYCLATE 100 MG PO TABS
100.0000 mg | ORAL_TABLET | Freq: Two times a day (BID) | ORAL | Status: DC
  Administered 2023-05-07 – 2023-05-09 (×4): 100 mg via ORAL
  Filled 2023-05-07 (×4): qty 1

## 2023-05-07 MED ORDER — PANTOPRAZOLE SODIUM 40 MG PO TBEC
40.0000 mg | DELAYED_RELEASE_TABLET | Freq: Every day | ORAL | Status: DC
  Administered 2023-05-08 – 2023-05-09 (×2): 40 mg via ORAL
  Filled 2023-05-07 (×2): qty 1

## 2023-05-07 MED ORDER — ASPIRIN 81 MG PO TBEC
81.0000 mg | DELAYED_RELEASE_TABLET | Freq: Every day | ORAL | Status: DC
  Administered 2023-05-08 – 2023-05-09 (×2): 81 mg via ORAL
  Filled 2023-05-07 (×2): qty 1

## 2023-05-07 MED ORDER — FLUTICASONE FUROATE-VILANTEROL 100-25 MCG/ACT IN AEPB
1.0000 | INHALATION_SPRAY | Freq: Every day | RESPIRATORY_TRACT | Status: DC
  Administered 2023-05-08: 1 via RESPIRATORY_TRACT
  Filled 2023-05-07 (×2): qty 28

## 2023-05-07 MED ORDER — ENOXAPARIN SODIUM 30 MG/0.3ML IJ SOSY
30.0000 mg | PREFILLED_SYRINGE | INTRAMUSCULAR | Status: DC
  Administered 2023-05-07 – 2023-05-08 (×2): 30 mg via SUBCUTANEOUS
  Filled 2023-05-07 (×2): qty 0.3

## 2023-05-07 MED ORDER — SODIUM CHLORIDE 0.9 % IV BOLUS
1000.0000 mL | Freq: Once | INTRAVENOUS | Status: AC
  Administered 2023-05-07: 1000 mL via INTRAVENOUS

## 2023-05-07 MED ORDER — SODIUM CHLORIDE 0.9 % IV BOLUS
2000.0000 mL | Freq: Once | INTRAVENOUS | Status: AC
  Administered 2023-05-08: 1000 mL via INTRAVENOUS

## 2023-05-07 MED ORDER — DULOXETINE HCL 20 MG PO CPEP
20.0000 mg | ORAL_CAPSULE | Freq: Two times a day (BID) | ORAL | Status: DC
  Administered 2023-05-08 – 2023-05-09 (×3): 20 mg via ORAL
  Filled 2023-05-07 (×5): qty 1

## 2023-05-07 MED ORDER — ATORVASTATIN CALCIUM 20 MG PO TABS
40.0000 mg | ORAL_TABLET | Freq: Every day | ORAL | Status: DC
  Administered 2023-05-07 – 2023-05-08 (×2): 40 mg via ORAL
  Filled 2023-05-07 (×2): qty 2

## 2023-05-07 MED ORDER — AMLODIPINE BESYLATE 5 MG PO TABS
5.0000 mg | ORAL_TABLET | Freq: Every day | ORAL | Status: DC
  Administered 2023-05-08 – 2023-05-09 (×3): 5 mg via ORAL
  Filled 2023-05-07 (×3): qty 1

## 2023-05-07 NOTE — H&P (Signed)
History and Physical    Patient: Shannon Li VZD:[id number] DOB: 02/08/1951 DOA: 05/07/2023 DOS: the patient was seen and examined on 05/07/2023 PCP: Carlean Jews, PA-C  Patient coming from: Home  Chief Complaint: Generalized weakness Chief Complaint  Patient presents with   Chest Pain   HPI: Shannon Li is a 73 y.o. female with medical history significant of anxiety, COPD, depression, GERD, hypertension, IBS, CKD stage IIIb who was otherwise well until within the last 1 week when she started having headache with feeling of congestion and generalized weakness.  Patient followed up with her PCP and was thought to have sinusitis and prescribed antibiotic course.  According to patient she has also been experiencing on and off blurry vision with some numbness involving the right side of her face for the last several days.  Her PCP mailed a Zio patch to have which was placed yesterday.  She also noted that her gait was becoming abnormal with tendency to fall and therefore decided to come in for further management.  Denies nausea vomiting abdominal pain chest pain headache ED course: Temperature 97.6 respiratory rate 18 pulse 63 BP 126/100 CT head, chest x-ray done, MRA head and neck requested with results pending as well as urinalysis.  Hospitalist service was therefore contacted to admit patient for further management  Review of Systems: As mentioned in the history of present illness. All other systems reviewed and are negative. Past Medical History:  Diagnosis Date   Anxiety    Arthritis    Atrophic kidney    Chronic constipation    Colon polyp 2013   COPD (chronic obstructive pulmonary disease) (HCC)    Depression    Diverticulosis    Esophageal stricture    Esophagitis    Fibrocystic breast disease    GERD (gastroesophageal reflux disease)    History of hiatal hernia    Hypertension    IBS (irritable bowel syndrome)    Menopause    Ovarian cyst    Perirectal  abscess    Plantar fasciitis    Postmenopausal    Renal artery occlusion (HCC)    Renal insufficiency    Right kidney is small and not functioning.   Past Surgical History:  Procedure Laterality Date   ABSCESS DRAINAGE  11/17/2013   buttock right   BREAST BIOPSY Right    neg   CATARACT EXTRACTION W/PHACO Right 08/17/2019   Procedure: CATARACT EXTRACTION PHACO AND INTRAOCULAR LENS PLACEMENT (IOC) RIGHT VISION BLUE 5.32  00:42.7;  Surgeon: Elliot Cousin, MD;  Location: [facility];  Service: Ophthalmology;  Laterality: Right;   CATARACT EXTRACTION W/PHACO Left 09/21/2019   Procedure: CATARACT EXTRACTION PHACO AND INTRAOCULAR LENS PLACEMENT (IOC) LEFT VISION BLUE;  Surgeon: Elliot Cousin, MD;  Location: [facility];  Service: Ophthalmology;  Laterality: Left;  6.24 0:40.6   CHOLECYSTECTOMY  2008   COLONOSCOPY  2014   [facility] in [place]   COLONOSCOPY WITH PROPOFOL N/A 08/26/2015   Procedure: COLONOSCOPY WITH PROPOFOL;  Surgeon: Wallace Cullens, MD;  Location: [facility] END[facility]COPY;  Service: Gastroenterology;  Laterality: N/A;   COLONOSCOPY WITH PROPOFOL N/A 03/03/2021   Procedure: COLONOSCOPY WITH PROPOFOL;  Surgeon: Regis Bill, MD;  Location: [facility] ENDOSCOPY;  Service: Endoscopy;  Laterality: N/A;   DILATION AND CURETTAGE OF UTERUS     ESOPHAGOGASTRODUODENOSCOPY N/A 11/15/2014   Procedure: ESOPHAGOGASTRODUODENOSCOPY (EGD);  Surgeon: Wallace Cullens, MD;  Location: [facility] END[facility]COPY;  Service: Gastroenterology;  Laterality: N/A;   ESOPHAGOGASTRODUODENOSCOPY (EGD) WITH PROPOFOL  N/A 03/03/2021   Procedure: ESOPHAGOGASTRODUODENOSCOPY (EGD) WITH PROPOFOL;  Surgeon: Regis Bill, MD;  Location: [facility] ENDOSCOPY;  Service: Endoscopy;  Laterality: N/A;   EYE SURGERY     GASTROSTOMY     UPPER GI ENDOSCOPY  2014   [facility] in [place]   Social History:  reports that she has been smoking cigarettes. She has a 15 pack-year smoking history. She has never used smokeless  tobacco. She reports that she does not drink alcohol and does not use drugs.  Allergies  Allergen Reactions   Bupropion Nausea And Vomiting   Penicillin V Potassium Other (See Comments)   Penicillins Hives    Family History  Problem Relation Age of Onset   Heart disease Mother    Heart attack Mother    Diabetes Mother    Alzheimer's disease Father    Breast cancer Sister 66   Lung cancer Brother    Brain cancer Brother    Lung cancer Sister    Bladder Cancer Sister    Lung cancer Brother    Leukemia Brother    Diabetes Brother     Prior to Admission medications   Medication Sig Start Date End Date Taking? Authorizing Provider  amLODipine (NORVASC) 5 MG tablet TAKE 1 TABLET (5 MG TOTAL) BY MOUTH DAILY. 12/14/21   Lyndon Code, MD  bisoprolol-hydrochlorothiazide [facility]) 2.5-6.25 MG tablet TAKE 1 TABLET BY MOUTH EVERY DAY FOR BLOOD PRESSURE 08/28/21   Sallyanne Kuster, NP  doxycycline (VIBRA-TABS) 100 MG tablet Take 1 tablet (100 mg total) by mouth 2 (two) times daily. 05/03/23   McDonough, Salomon Fick, PA-C  DULoxetine (CYMBALTA) 20 MG capsule Take 1 capsule (20 mg total) by mouth 2 (two) times daily. 10/16/22   McDonough, Salomon Fick, PA-C  ezetimibe (ZETIA) 10 MG tablet TAKE 1 TABLET BY MOUTH EVERY DAY 12/24/22   McDonough, Lauren K, PA-C  famotidine (PEPCID) 40 MG tablet Take 40 mg by mouth daily.    [provider]  fluticasone furoate-vilanterol (BREO ELLIPTA) 100-25 MCG/ACT AEPB Inhale 1 puff into the lungs daily. 06/15/22   McDonough, Salomon Fick, PA-C  pantoprazole (PROTONIX) 40 MG tablet TAKE 1 TABLET BY MOUTH DAILY 12/24/22   McDonough, Lauren K, PA-C  triamcinolone cream (KENALOG) 0.1 % Apply 1 Application topically 2 (two) times daily. 02/25/23   Sallyanne Kuster, NP    Physical Exam: Vitals:   05/07/23 1104 05/07/23 1419 05/07/23 1603  BP: (!) 126/100 131/71 (!) 163/89  Pulse: 63 (!) 55 (!) 58  Resp: 18 19 16   Temp: 97.6 F (36.4 C) 98 F (36.7 C)   TempSrc: Oral     SpO2: 100% 96% 99%  Weight: 75.8 kg    Height: 5\' 8"  (1.727 m)     General: Patient awake knows that she is in the hospital and knows the year to be 2025 Respiratory: Clear to auscultation Musculoskeletal: Moving all extremities CNS: Oriented x 3 CVS: S1 and S2 present no murmur head Abdomen: Full but soft no organ palpable  Data Reviewed: I have reviewed patient's CT scan of the brain that did not show any acute intracranial pathology I have also reviewed patient's chest x-ray that was within normal limits    Latest Ref Rng & Units 05/07/2023   11:11 AM 11/02/2022    8:18 AM 09/29/2021    8:19 AM  CBC  WBC 4.0 - 10.5 K/uL 7.1  7.4  7.9   Hemoglobin 12.0 - 15.0 g/dL 09.8  11.9  14.7  Hematocrit 36.0 - 46.0 % 43.4  43.6  44.2   Platelets 150 - 400 K/uL 296  336  364        Latest Ref Rng & Units 05/07/2023   11:11 AM 11/02/2022    8:18 AM 09/29/2021    8:19 AM  BMP  Glucose 70 - 99 mg/dL 952  841  96   BUN 8 - 23 mg/dL 31  22  25    Creatinine 0.44 - 1.00 mg/dL 3.24  4.01  0.27   BUN/Creat Ratio 12 - 28  13  12    Sodium 135 - 145 mmol/L 135  140  137   Potassium 3.5 - 5.1 mmol/L 3.8  4.1  4.4   Chloride 98 - 111 mmol/L 99  100  98   CO2 22 - 32 mmol/L 20  22  22    Calcium 8.9 - 10.3 mg/dL 9.2  9.5  9.5      Assessment and Plan:  Abnormal gait of unclear etiology to rule out posterior circulation stroke Symptom onset several days ago CT scan of the brain unrevealing Follow-up MRA head and neck Replacing aspirin and statin therapy Obtain A1c and lipid panel To consult neurology pending MRI findings PT OT consulted Plan of care discussed with ED physician  AKI on CKD stage IIIb Baseline creatinine of 1.7 however presented with creatinine of 2.1 Placed on IV fluid Monitor renal function closely Avoid nephrotoxic medications  COPD Continue as needed nebulization  Depression-continue home medication, duloxetine  GERD-placed on PPI  therapy   Hypertension-continue amlodipine    Advance Care Planning:   Code Status: Not on file full code  Consults: We will consult neuro depending on MRI findings  Family Communication: Discussed with niece present at bedside  Severity of Illness: The appropriate patient status for this patient is OBSERVATION. Observation status is judged to be reasonable and necessary in order to provide the required intensity of service to ensure the patient's safety. The patient's presenting symptoms, physical exam findings, and initial radiographic and laboratory data in the context of their medical condition is felt to place them at decreased risk for further clinical deterioration. Furthermore, it is anticipated that the patient will be medically stable for discharge from the hospital within 2 midnights of admission.   Author: Loyce Dys, MD 05/07/2023 5:43 PM  For on call review [email or web address].

## 2023-05-07 NOTE — ED Provider Notes (Signed)
[facility] Provider Note    Event Date/Time   First MD Initiated Contact with Patient 05/07/23 1520     (approximate)   History   Chest Pain   HPI  Shannon Li is a 73 y.o. female past medical history significant for hypertension, presents to the emergency department with altered mental status and dizziness.  States that over the past 4 to 5 days she has been having significant dizziness.  States that when she goes to walk she feels intoxicated and off balance.  States that it is worse when she goes from sitting to standing.  Denies any significant room spinning dizziness at rest but does states she feels off balance with walking.  1 week ago was ambulating on her own without any difficulties.  No new falls or trauma.  Complaining of a headache and head pressure sensation.  Denies any change in hearing or ringing or ears.  Problems with her right leg feeling very restless and not able to control it.  No history of seizures.  Denies any neck pain or chest pain.  No shortness of breath.  No new medications other than an antibiotic for questionable sinus infection.  Her niece at bedside states that she has not been acting herself and has been with pressured speech and has been not acting normal.  No history of CVA.  Not on anticoagulation.  Denies dysuria, urinary urgency or frequency.     Physical Exam   Triage Vital Signs: ED Triage Vitals [05/07/23 1104]  Encounter Vitals Group     BP (!) 126/100     Systolic BP Percentile      Diastolic BP Percentile      Pulse Rate 63     Resp 18     Temp 97.6 F (36.4 C)     Temp Source Oral     SpO2 100 %     Weight 167 lb 1.7 oz (75.8 kg)     Height 5\' 8"  (1.727 m)     Head Circumference      Peak Flow      Pain Score 10     Pain Loc      Pain Education      Exclude from Growth Chart     Most recent vital signs: Vitals:   05/07/23 1419 05/07/23 1603  BP: 131/71 (!) 163/89  Pulse: (!) 55 (!) 58   Resp: 19 16  Temp: 98 F (36.7 C)   SpO2: 96% 99%    Physical Exam Constitutional:      Appearance: She is well-developed.  HENT:     Head: Atraumatic.  Eyes:     Extraocular Movements: Extraocular movements intact.     Conjunctiva/sclera: Conjunctivae normal.     Pupils: Pupils are equal, round, and reactive to light.  Cardiovascular:     Rate and Rhythm: Regular rhythm.     Heart sounds: Normal heart sounds. No murmur heard. Pulmonary:     Effort: No respiratory distress.     Comments: Zio patch in place Abdominal:     General: There is no distension.  Musculoskeletal:        General: Normal range of motion.     Cervical back: Normal range of motion and neck supple.  Skin:    General: Skin is warm.     Capillary Refill: Capillary refill takes less than 2 seconds.  Neurological:     Mental Status: She is alert. Mental status is at  baseline.     GCS: GCS eye subscore is 4. GCS verbal subscore is 5. GCS motor subscore is 6.     Cranial Nerves: Cranial nerves 2-12 are intact.     Sensory: Sensation is intact.     Motor: Motor function is intact.     Coordination: Finger-Nose-Finger Test abnormal.     Gait: Gait abnormal.     Deep Tendon Reflexes:     Reflex Scores:      Patellar reflexes are 2+ on the right side and 2+ on the left side.    Comments: Difficulty with gait, falling to the right side.  Problems with dysmetria and understanding instructions of finger-to-nose.  No nystagmus.     IMPRESSION / MDM / ASSESSMENT AND PLAN / ED COURSE  I reviewed the triage vital signs and the nursing notes.  Differential diagnosis including intracranial infarction, intracranial mass, posterior circulation CVA, electrolyte abnormality, dehydration  EKG  I, Corena Herter, the attending physician, personally viewed and interpreted this ECG.   Rate: Normal  Rhythm: Normal sinus  Axis: Normal  Intervals: Normal  ST&T Change: None. 1 PVC    RADIOLOGY I independently  reviewed imaging, my interpretation of imaging: CT scan of the head with no signs of intracranial hemorrhage.  Chest x-ray no signs of pneumonia  LABS (all labs ordered are listed, but only abnormal results are displayed) Labs interpreted as -    Labs Reviewed  BASIC METABOLIC PANEL - Abnormal; Notable for the following components:      Result Value   CO2 20 (*)    Glucose, Bld 119 (*)    BUN 31 (*)    Creatinine, Ser 2.11 (*)    GFR, Estimated 24 (*)    Anion gap 16 (*)    All other components within normal limits  RESP PANEL BY RT-PCR (RSV, FLU A&B, COVID)  RVPGX2  CBC  URINALYSIS, W/ REFLEX TO CULTURE (INFECTION SUSPECTED)  SEDIMENTATION RATE  C-REACTIVE PROTEIN  TROPONIN I (HIGH SENSITIVITY)  TROPONIN I (HIGH SENSITIVITY)     MDM  Mild acute kidney injury.  Anion gap with CO2 of 20.  Troponin negative with no signs of ACS, no active chest pain at this time.  Clinical picture is not consistent with a meningitis.  No fever or meningismus on exam patient does have gait instability with altered mental status and dizziness.  CT scan of the head with no signs of intracranial hemorrhage.  Patient does have a headache at this time, no temporal tenderness or jaw claudication, added on ESR and CRP but have a lower suspicion for giant cell arteritis.  Unable to order CTA given her low GFR.  Concern for posterior circulation CVA.  No tearing pain, no neck manipulation, have a lower suspicion for dissection.  Consulted hospitalist for admission for gait instability, altered mental status     PROCEDURES:  Critical Care performed: No  Procedures  Patient's presentation is most consistent with acute presentation with potential threat to life or bodily function.   MEDICATIONS ORDERED IN ED: Medications  sodium chloride 0.9 % bolus 1,000 mL (has no administration in time range)    FINAL CLINICAL IMPRESSION(S) / ED DIAGNOSES   Final diagnoses:  Dizzy  Gait instability  Acute  nonintractable headache, unspecified headache type     Rx / DC Orders   ED Discharge Orders     None        Note:  This document was prepared using Dragon voice recognition  software and may include unintentional dictation errors.   Corena Herter, MD 05/07/23 1735

## 2023-05-07 NOTE — ED Provider Triage Note (Signed)
Emergency Medicine Provider Triage Evaluation Note  Shannon Li , a 73 y.o. female  was evaluated in triage.  Pt complains of worsening dizziness and nausea x 1 week.  Patient reports she felt like she was going to faint today which is why she presents to the ED.  Patient was placed on a continuous EKG monitor last week for her dizziness by her primary care physician.  Patient denies chest pain.  Review of Systems  Positive:  Negative: Visual loss, LOC   Physical Exam  BP (!) 126/100   Pulse 63   Temp 97.6 F (36.4 C) (Oral)   Resp 18   Ht 5\' 8"  (1.727 m)   Wt 75.8 kg   SpO2 100%   BMI 25.41 kg/m  Gen:   Awake, no distress   Resp:  Normal effort LCTAB, CV- RRR MSK:   Moves extremities without difficulty  Other:    Medical Decision Making  Medically screening exam initiated at 11:15 AM.  Appropriate orders placed.  Shannon Li was informed that the remainder of the evaluation will be completed by another provider, this initial triage assessment does not replace that evaluation, and the importance of remaining in the ED until their evaluation is complete.     Romeo Apple, Nafisah Runions A, PA-C 05/07/23 1118

## 2023-05-07 NOTE — Telephone Encounter (Signed)
Patient called stating she is not any better than when she came in for appointment. She is having her niece take her to the ED-Toni

## 2023-05-07 NOTE — ED Triage Notes (Signed)
Pt here with cp. Pt is wearing an external heart monitor from her cardiologist. Pt states pain is all over and is sharp in nature. Pt endorses dizziness and nausea. Pt alert and oriented.

## 2023-05-07 NOTE — ED Notes (Signed)
 Report given to [facility].

## 2023-05-08 DIAGNOSIS — I639 Cerebral infarction, unspecified: Secondary | ICD-10-CM

## 2023-05-08 DIAGNOSIS — R269 Unspecified abnormalities of gait and mobility: Secondary | ICD-10-CM | POA: Diagnosis not present

## 2023-05-08 DIAGNOSIS — H538 Other visual disturbances: Secondary | ICD-10-CM | POA: Diagnosis not present

## 2023-05-08 DIAGNOSIS — R41842 Visuospatial deficit: Secondary | ICD-10-CM

## 2023-05-08 LAB — HEMOGLOBIN A1C
Hgb A1c MFr Bld: 5.9 % — ABNORMAL HIGH (ref 4.8–5.6)
Mean Plasma Glucose: 122.63 mg/dL

## 2023-05-08 LAB — CBC
HCT: 38.8 % (ref 36.0–46.0)
Hemoglobin: 13.1 g/dL (ref 12.0–15.0)
MCH: 29.5 pg (ref 26.0–34.0)
MCHC: 33.8 g/dL (ref 30.0–36.0)
MCV: 87.4 fL (ref 80.0–100.0)
Platelets: 265 10*3/uL (ref 150–400)
RBC: 4.44 MIL/uL (ref 3.87–5.11)
RDW: 13.5 % (ref 11.5–15.5)
WBC: 7.6 10*3/uL (ref 4.0–10.5)
nRBC: 0 % (ref 0.0–0.2)

## 2023-05-08 LAB — BASIC METABOLIC PANEL WITH GFR
Anion gap: 11 (ref 5–15)
BUN: 26 mg/dL — ABNORMAL HIGH (ref 8–23)
CO2: 20 mmol/L — ABNORMAL LOW (ref 22–32)
Calcium: 8.6 mg/dL — ABNORMAL LOW (ref 8.9–10.3)
Chloride: 105 mmol/L (ref 98–111)
Creatinine, Ser: 1.67 mg/dL — ABNORMAL HIGH (ref 0.44–1.00)
GFR, Estimated: 32 mL/min — ABNORMAL LOW
Glucose, Bld: 87 mg/dL (ref 70–99)
Potassium: 4 mmol/L (ref 3.5–5.1)
Sodium: 136 mmol/L (ref 135–145)

## 2023-05-08 LAB — RESP PANEL BY RT-PCR (RSV, FLU A&B, COVID)  RVPGX2
Influenza A by PCR: NEGATIVE
Influenza B by PCR: NEGATIVE
Resp Syncytial Virus by PCR: NEGATIVE
SARS Coronavirus 2 by RT PCR: NEGATIVE

## 2023-05-08 LAB — C-REACTIVE PROTEIN: CRP: 1.5 mg/dL — ABNORMAL HIGH

## 2023-05-08 MED ORDER — CLOPIDOGREL BISULFATE 75 MG PO TABS
75.0000 mg | ORAL_TABLET | Freq: Every day | ORAL | Status: DC
  Administered 2023-05-08 – 2023-05-09 (×2): 75 mg via ORAL
  Filled 2023-05-08 (×2): qty 1

## 2023-05-08 MED ORDER — ALPRAZOLAM 0.5 MG PO TABS
0.5000 mg | ORAL_TABLET | Freq: Two times a day (BID) | ORAL | Status: DC | PRN
  Administered 2023-05-08 (×2): 0.5 mg via ORAL
  Filled 2023-05-08 (×2): qty 1

## 2023-05-08 MED ORDER — ROSUVASTATIN CALCIUM 10 MG PO TABS
10.0000 mg | ORAL_TABLET | Freq: Every day | ORAL | Status: DC
  Administered 2023-05-08 – 2023-05-09 (×2): 10 mg via ORAL
  Filled 2023-05-08 (×2): qty 1

## 2023-05-08 NOTE — Progress Notes (Signed)
Progress Note   Patient: Shannon Li PPI:[id number] DOB: [birth year] DOA: 05/07/2023     0 DOS: the patient was seen and examined on 05/08/2023   Brief hospital course: Shannon Li is a 73 y.o. female with medical history significant of anxiety, COPD, depression, GERD, hypertension, IBS, CKD stage IIIb who was otherwise well until within the last 1 week when she started having headache with feeling of congestion and generalized weakness.  Patient followed up with her PCP and was thought to have sinusitis and prescribed antibiotic course.  According to patient she has also been experiencing on and off blurry vision with some numbness involving the right side of her face for the last several days.  Her PCP mailed a Zio patch to have which was placed yesterday.  She also noted that her gait was becoming abnormal with tendency to fall and therefore decided to come in for further management.  Denies nausea vomiting abdominal pain chest pain headache   Assessment and Plan:  Acute ischemic stroke  Symptom onset several days ago CT scan of the brain unrevealing Follow-up MRA head and neck Replacing aspirin, Plavix and statin therapy Obtain A1c and lipid panel MRI showing ischemic stroke Plan of care discussed with neurologist PT OT consulted    AKI on CKD stage IIIb Baseline creatinine of 1.7 however presented with creatinine of 2.1 Placed on IV fluid Monitor renal function closely Avoid nephrotoxic medications   COPD Continue as needed nebulization   Depression-continue home medication, duloxetine   GERD-placed on PPI therapy     Hypertension-continue amlodipine      Advance Care Planning:   Code Status: Not on file full code   Consults: We will consult neuro depending on MRI findings   Family Communication: Discussed with niece present at bedside   Subjective:  Patient seen and examined Still having abnormal gait Having some difficulty getting her words  labs Denies nausea vomiting abdominal pain chest pain or cough  Physical Exam:  General: Patient awake knows that she is in the hospital and knows the year to be 2025 Respiratory: Clear to auscultation Musculoskeletal: Moving all extremities CNS: Oriented x 3 CVS: S1 and S2 present no murmur head Abdomen: Full but soft no organ palpable  Vitals:   05/08/23 1400 05/08/23 1430 05/08/23 1500 05/08/23 1530  BP: (!) 114/100 (!) 101/56 (!) 131/107 115/71  Pulse: 77 76 91 88  Resp: 19 (!) 23 16 (!) 22  Temp:      TempSrc:      SpO2: 97% 95% 98% 94%  Weight:      Height:        Data Reviewed: I have reviewed patient's MRI of the brain showing acute stroke    Latest Ref Rng & Units 05/08/2023    5:46 AM 05/07/2023   11:11 AM 11/02/2022    8:18 AM  BMP  Glucose 70 - 99 mg/dL 87  063  016   BUN 8 - 23 mg/dL 26  31  22    Creatinine 0.44 - 1.00 mg/dL 0.10  9.32  3.55   BUN/Creat Ratio 12 - 28   13   Sodium 135 - 145 mmol/L 136  135  140   Potassium 3.5 - 5.1 mmol/L 4.0  3.8  4.1   Chloride 98 - 111 mmol/L 105  99  100   CO2 22 - 32 mmol/L 20  20  22    Calcium 8.9 - 10.3 mg/dL 8.6  9.2  9.5  Latest Ref Rng & Units 05/08/2023    5:46 AM 05/07/2023   11:11 AM 11/02/2022    8:18 AM  CBC  WBC 4.0 - 10.5 K/uL 7.6  7.1  7.4   Hemoglobin 12.0 - 15.0 g/dL 64.4  03.4  74.2   Hematocrit 36.0 - 46.0 % 38.8  43.4  43.6   Platelets 150 - 400 K/uL 265  296  336     Author: Loyce Dys, MD 05/08/2023 4:40 PM  For on call review [email or web address].

## 2023-05-08 NOTE — Consult Note (Addendum)
NEUROLOGY CONSULT NOTE   Date of service: May 08, 2023 Patient Name: Shannon Li MRN:  [id number] DOB:  03/01/1951 Chief Complaint: subacute ischemic stroke Requesting Provider: Loyce Dys, MD  History of Present Illness   This is a 73 year old woman with past medical history significant for COPD, hypertension, CKD stage IIIb who was at her usual state of health until about a week ago when she started having headache, generalized weakness, and congestion.  She was felt to have sinusitis and was prescribed a course of antibiotics.  She began to feel better but over the last several days has been experiencing some numbness involving the right side of her face and also "blurry" vision.  She also felt that her gait was abnormal and she had a tendency to fall to the right side.  She reports that she would get confused primarily when she was trying to find where something was in her house.  This was difficult to even know the object would be in the place that it usually was located.  She had difficulty using the buttons on the remote control and also remembering how to do simple activities that require multiple steps.  The symptoms began approximately 5 days ago.  Her visual symptoms have resolved and her gait as well as her visuospatial difficulty have both improved but not resolved.  LKW: 5 days ago Modified rankin score: 2-Slight disability-UNABLE to perform all activities but does not need assistance   NIHSS components Score: Comment  1a Level of Conscious 0[x]  1[]  2[]  3[]      1b LOC Questions 0[]  1[]  2[x]       1c LOC Commands 0[x]  1[]  2[]       2 Best Gaze 0[x]  1[]  2[]       3 Visual 0[x]  1[]  2[]  3[]      4 Facial Palsy 0[]  1[x]  2[]  3[]      5a Motor Arm - left 0[x]  1[]  2[]  3[]  4[]  UN[]    5b Motor Arm - Right 0[x]  1[]  2[]  3[]  4[]  UN[]    6a Motor Leg - Left 0[x]  1[]  2[]  3[]  4[]  UN[]    6b Motor Leg - Right 0[x]  1[]  2[]  3[]  4[]  UN[]    7 Limb Ataxia 0[x]  1[]  2[]  3[]  UN[]     8 Sensory  0[]  1[x]  2[]  UN[]      9 Best Language 0[x]  1[]  2[]  3[]      10 Dysarthria 0[]  1[x]  2[]  UN[]      11 Extinct. and Inattention 0[x]  1[]  2[]       TOTAL:  5      ROS   Comprehensive ROS performed and pertinent positives documented in HPI   Past History   Past Medical History:  Diagnosis Date   Anxiety    Arthritis    Atrophic kidney    Chronic constipation    Colon polyp 2013   COPD (chronic obstructive pulmonary disease) (HCC)    Depression    Diverticulosis    Esophageal stricture    Esophagitis    Fibrocystic breast disease    GERD (gastroesophageal reflux disease)    History of hiatal hernia    Hypertension    IBS (irritable bowel syndrome)    Menopause    Ovarian cyst    Perirectal abscess    Plantar fasciitis    Postmenopausal    Renal artery occlusion (HCC)    Renal insufficiency    Right kidney is small and not functioning.    Past Surgical History:  Procedure Laterality Date   ABSCESS  DRAINAGE  11/17/2013   buttock right   BREAST BIOPSY Right    neg   CATARACT EXTRACTION W/PHACO Right 08/17/2019   Procedure: CATARACT EXTRACTION PHACO AND INTRAOCULAR LENS PLACEMENT (IOC) RIGHT VISION BLUE 5.32  00:42.7;  Surgeon: Elliot Cousin, MD;  Location: [facility];  Service: Ophthalmology;  Laterality: Right;   CATARACT EXTRACTION W/PHACO Left 09/21/2019   Procedure: CATARACT EXTRACTION PHACO AND INTRAOCULAR LENS PLACEMENT (IOC) LEFT VISION BLUE;  Surgeon: Elliot Cousin, MD;  Location: [facility];  Service: Ophthalmology;  Laterality: Left;  6.24 0:40.6   CHOLECYSTECTOMY  2008   COLONOSCOPY  2014   [facility] in [place]   COLONOSCOPY WITH PROPOFOL N/A 08/26/2015   Procedure: COLONOSCOPY WITH PROPOFOL;  Surgeon: Wallace Cullens, MD;  Location: [facility];  Service: Gastroenterology;  Laterality: N/A;   COLONOSCOPY WITH PROPOFOL N/A 03/03/2021   Procedure: COLONOSCOPY WITH PROPOFOL;  Surgeon: Regis Bill, MD;  Location: [facility] ENDOSCOPY;   Service: Endoscopy;  Laterality: N/A;   DILATION AND CURETTAGE OF UTERUS     ESOPHAGOGASTRODUODENOSCOPY N/A 11/15/2014   Procedure: ESOPHAGOGASTRODUODENOSCOPY (EGD);  Surgeon: Wallace Cullens, MD;  Location: [facility] END[facility]COPY;  Service: Gastroenterology;  Laterality: N/A;   ESOPHAGOGASTRODUODENOSCOPY (EGD) WITH PROPOFOL N/A 03/03/2021   Procedure: ESOPHAGOGASTRODUODENOSCOPY (EGD) WITH PROPOFOL;  Surgeon: Regis Bill, MD;  Location: [facility] ENDOSCOPY;  Service: Endoscopy;  Laterality: N/A;   EYE SURGERY     GASTROSTOMY     UPPER GI ENDOSCOPY  2014   [facility] in [place]    Family History: Family History  Problem Relation Age of Onset   Heart disease Mother    Heart attack Mother    Diabetes Mother    Alzheimer's disease Father    Breast cancer Sister 68   Lung cancer Brother    Brain cancer Brother    Lung cancer Sister    Bladder Cancer Sister    Lung cancer Brother    Leukemia Brother    Diabetes Brother     Social History  reports that she has been smoking cigarettes. She has a 15 pack-year smoking history. She has never used smokeless tobacco. She reports that she does not drink alcohol and does not use drugs.  Allergies  Allergen Reactions   Bupropion Nausea And Vomiting   Penicillin V Potassium Other (See Comments)   Penicillins Hives    Medications   Current Facility-Administered Medications:    ALPRAZolam (XANAX) tablet 0.5 mg, 0.5 mg, Oral, BID PRN, Rosezetta Schlatter T, MD, 0.5 mg at 05/08/23 1335   amLODipine (NORVASC) tablet 5 mg, 5 mg, Oral, Daily, Djan, Prince T, MD, 5 mg at 05/08/23 1042   aspirin EC tablet 81 mg, 81 mg, Oral, Daily, Djan, Prince T, MD, 81 mg at 05/08/23 1043   atorvastatin (LIPITOR) tablet 40 mg, 40 mg, Oral, Daily, Djan, Prince T, MD, 40 mg at 05/08/23 1043   doxycycline (VIBRA-TABS) tablet 100 mg, 100 mg, Oral, BID, Djan, Prince T, MD, 100 mg at 05/08/23 1043   DULoxetine (CYMBALTA) DR capsule 20 mg, 20 mg, Oral, BID, Djan, Prince T, MD,  20 mg at 05/08/23 1042   enoxaparin (LOVENOX) injection 30 mg, 30 mg, Subcutaneous, Q24H, Djan, Prince T, MD, 30 mg at 05/07/23 2314   fluticasone furoate-vilanterol (BREO ELLIPTA) 100-25 MCG/ACT 1 puff, 1 puff, Inhalation, Daily, Djan, Prince T, MD, 1 puff at 05/08/23 1047   pantoprazole (PROTONIX) EC tablet 40 mg, 40 mg, Oral, Daily, Djan, Scarlette Calico, MD, 40 mg at  05/08/23 1043  Current Outpatient Medications:    amLODipine (NORVASC) 5 MG tablet, TAKE 1 TABLET (5 MG TOTAL) BY MOUTH DAILY., Disp: 90 tablet, Rfl: 1   bisoprolol-hydrochlorothiazide (ZIAC) 2.5-6.25 MG tablet, TAKE 1 TABLET BY MOUTH EVERY DAY FOR BLOOD PRESSURE, Disp: 90 tablet, Rfl: 1   doxycycline (VIBRA-TABS) 100 MG tablet, Take 1 tablet (100 mg total) by mouth 2 (two) times daily., Disp: 20 tablet, Rfl: 0   ezetimibe (ZETIA) 10 MG tablet, TAKE 1 TABLET BY MOUTH EVERY DAY, Disp: 90 tablet, Rfl: 1   famotidine (PEPCID) 40 MG tablet, Take 40 mg by mouth daily., Disp: , Rfl:    fluticasone furoate-vilanterol (BREO ELLIPTA) 100-25 MCG/ACT AEPB, Inhale 1 puff into the lungs daily., Disp: 1 each, Rfl: 11   pantoprazole (PROTONIX) 40 MG tablet, TAKE 1 TABLET BY MOUTH DAILY, Disp: 90 tablet, Rfl: 1   triamcinolone cream (KENALOG) 0.1 %, Apply 1 Application topically 2 (two) times daily., Disp: 30 g, Rfl: 0  Vitals   Vitals:   05/08/23 1042 05/08/23 1045 05/08/23 1200 05/08/23 1230  BP: (!) 156/61 (!) 156/61 (!) 169/76 136/85  Pulse:  72 78 78  Resp:  15 19 (!) 21  Temp:      TempSrc:      SpO2:  96% 96% 96%  Weight:      Height:        Body mass index is 25.41 kg/m.  Physical Exam   Gen: patient lying in bed, NAD CV: extremities appear well-perfused Resp: normal WOB  Neurologic Examination   Neurologic exam MS: alert, oriented to self, family member at bedside, and "hospital," does not know her age or the month. Follows simple commands. Speech: mild dysarthria, no aphasia CN: PERRL, VFF, EOMI, sensation intact, mild R  facial droop, hearing intact to voice Motor: 5/5 strength throughout Sensory: subjective sensory deficit R face and RUE Reflexes: 2+ symm with toes down bilat Coordination: FNF without ataxia bilat Gait: deferred  Labs/Imaging/Neurodiagnostic studies   CBC:  Recent Labs  Lab 2023/06/04 1111 05/08/23 0546  WBC 7.1 7.6  HGB 14.7 13.1  HCT 43.4 38.8  MCV 88.6 87.4  PLT 296 265   Basic Metabolic Panel:  Lab Results  Component Value Date   NA 136 05/08/2023   K 4.0 05/08/2023   CO2 20 (L) 05/08/2023   GLUCOSE 87 05/08/2023   BUN 26 (H) 05/08/2023   CREATININE 1.67 (H) 05/08/2023   CALCIUM 8.6 (L) 05/08/2023   GFRNONAA 32 (L) 05/08/2023   GFRAA 44 (L) 09/07/2019   Lipid Panel:  Lab Results  Component Value Date   LDLCALC 107 (H) 2023-06-04   HgbA1c:  Lab Results  Component Value Date   HGBA1C 5.9 (H) 2023/06/04   Urine Drug Screen: No results found for: "LABOPIA", "COCAINSCRNUR", "LABBENZ", "AMPHETMU", "THCU", "LABBARB"  Alcohol Level No results found for: "ETH" INR No results found for: "INR" APTT No results found for: "APTT" AED levels: No results found for: "PHENYTOIN", "ZONISAMIDE", "LAMOTRIGINE", "LEVETIRACETA"  CT Head without contrast(Personally reviewed): 1.  No evidence of an acute intracranial abnormality. 2. Mild cerebral white matter disease, nonspecific but most often secondary to chronic small vessel ischemia. 3. Mild generalized cerebral atrophy. 4. Trace fluid within the bilateral mastoid air cells.  MRI Brain(Personally reviewed) MRA H&N 1. Small area of acute/early subacute ischemia at the left parietooccipital junction. No hemorrhage or mass effect. 2. Severe stenosis of the proximal left M1 segment. 3. Symmetric signal loss of the internal carotid arteries  at the proximal petrous segments, likely a motion/flow direction artifact.  TTE pending  ASSESSMENT   This is a 73 year old woman with past medical history significant for COPD,  hypertension, CKD stage IIIb who presented with visual disturbance ("blurry" but could not specify further), incoordinated gait, R face and arm paresthesias, and visuospatial impairment beginning 5 days ago.  Her visual symptoms have resolved and her gait as well as her visuospatial difficulty have both improved but not resolved. MRI brain showed small area of acute/early subacute ischemia at the left parietooccipital junction. Neurology is consulted for stroke workup.  Etiology of stroke is not clear. She has high-grade stenosis of the proximal L M1 segment but that should not impact the location of her infarct which is supplied blood from the PCA. Central embolic source cannot be ruled out, and that possibility will be evaluated further with ambulatory cardiac monitoring after discharge.  Of note, patient was oriented today only to self and hospital and had very poor attention during our conversation. V tangential speech. Family was present with me in the room during the exam but from the very little they shared with me outside of the room afterwards it seems that she has been showing signs of cognitive impairment for at least months and that it is gradually getting worse. Her small subacute stroke in the L parieto-occipital junction would not be responsible for this. She would likely benefit from further evaluation for possible cognitive impairment in the outpatient setting after her acute issues have resolved.  RECOMMENDATIONS   - No indication for permissive HTN >48 hrs out from sx onset. Goal normotension, avoid hypotension. - TTE pending - ASA 81mg  daily + plavix 75mg  daily x90 days f/b ASA 81mg  daily monotherapy after that (90 days DAPT instead of 21 due to severe intracranial stenosis). - She previously had severe muscle pain, weakness, and stiffness on high-dose atorvastatin therefore I have added that to her allergies and started low-dose crestor. This should be uptitrated in clinic as  tolerated until her LDL is at goal. - q4 hr neuro checks - STAT head CT for any change in neuro exam - Tele - PT/OT/SLP - Stroke education - Amb referral to neurology upon discharge. She should be evaluated for stroke f/u as well as several months of progressive mild cognitive impairment. - Ambulatory cardiac monitoring was previously arranged by cardiology. If this was not completed for full duration she will require a second full period of monitoring.  Neurology will f/u on outstanding studies tomorrow.  ______________________________________________________________________    Signed, Jefferson Fuel, MD [facility]

## 2023-05-08 NOTE — ED Notes (Signed)
Pt assisted to the bedside commode with one person assist.

## 2023-05-08 NOTE — Evaluation (Signed)
Occupational Therapy Evaluation Patient Details Name: Shannon Li MRN: [id number] DOB: 05-07-1950 Today's Date: 05/08/2023   History of Present Illness 73 y.o. female with PMHx: anxiety, COPD, depression, GERD, hypertension, IBS, CKD stage IIIb who was otherwise well until within the last 1 week when she started having headache with feeling of congestion and generalized weakness, AMS, dizziness, on and off blurry vision with some numbness involving the right side of her face for the last several days and gait abnormality with tendency to fall. MRI head: Small area of acute/early subacute ischemia at the left parietooccipital junction.   Clinical Impression   Pt was seen for PT/OT co-evaluation this date. Prior to hospital admission, pt was living alone and reports being IND without use of AD for all mobility and ADLs/IADLs. Has a brother and niece in the area. Pt was driving and community mobile.   Pt presents to acute OT demonstrating impaired ADL performance and functional mobility 2/2 balance deficits and limited safety awareness (See OT problem list for additional functional deficits). Pt is noted to be impulsive, restless, and talkative throughout session. Tangential speech and talking in circles at times. Extremities flailing with most movements, e.g. getting a lot of toilet paper from roll when not needed. SUP with bed mobility and LB dressing to don socks seated EOB. Pt currently requires CGA for STS and toilet transfers, as well as for clothing management and hygiene. Min A provided via HHA and second assist present to manage IV and other lines/leads. Pt with increased swaying during mobility and would benefit from RW vs cane. She would not be safe to return home alone d/t her balance and safety awareness deficits causing her to be an increased fall risk, but would be optional if family can provide 24/7 supervision.  Pt would benefit from skilled OT services to address noted impairments and  functional limitations (see below for any additional details) in order to maximize safety and independence while minimizing falls risk and caregiver burden. Do anticipate the need for follow up OT services upon acute hospital DC.        If plan is discharge home, recommend the following: A little help with walking and/or transfers;A little help with bathing/dressing/bathroom;Help with stairs or ramp for entrance;Assistance with cooking/housework;Assist for transportation;Direct supervision/assist for medications management;Supervision due to cognitive status;Direct supervision/assist for financial management    Functional Status Assessment  Patient has had a recent decline in their functional status and demonstrates the ability to make significant improvements in function in a reasonable and predictable amount of time.  Equipment Recommendations  Other (comment) (RW versus cane)    Recommendations for Other Services       Precautions / Restrictions Precautions Precautions: Fall Restrictions Weight Bearing Restrictions Per Provider Order: No      Mobility Bed Mobility Overal bed mobility: Needs Assistance Bed Mobility: Supine to Sit, Sit to Supine     Supine to sit: Supervision Sit to supine: Supervision   General bed mobility comments: patient restless/impulsive, requires supervision for safety with mobility    Transfers Overall transfer level: Needs assistance Equipment used: 1 person hand held assist Transfers: Sit to/from Stand Sit to Stand: Contact guard assist           General transfer comment: CGA for STS from EOB and Min A via HHA for all mobility in room and hallway with swaying noted      Balance Overall balance assessment: Needs assistance Sitting-balance support: Feet supported Sitting balance-Leahy Scale: Fair Sitting balance -  Comments: no LOB while seated EOB or on toilet   Standing balance support: Single extremity supported, During functional  activity Standing balance-Leahy Scale: Fair Standing balance comment: constant CGA to Min A for balance                           ADL either performed or assessed with clinical judgement   ADL Overall ADL's : Needs assistance/impaired     Grooming: Wash/dry hands;Standing;Contact guard assist               Lower Body Dressing: Supervision/safety;Sitting/lateral leans Lower Body Dressing Details (indicate cue type and reason): to don socks at [employer] Toilet Transfer: Contact guard assist;Regular Toilet;Grab bars;Rolling walker (2 wheels)   Toileting- Clothing Manipulation and Hygiene: Contact guard assist;Supervision/safety;Sit to/from stand;Sitting/lateral lean               Vision         Perception         Praxis         Pertinent Vitals/Pain Pain Assessment Pain Assessment: No/denies pain     Extremity/Trunk Assessment Upper Extremity Assessment Upper Extremity Assessment: Overall WFL for tasks assessed   Lower Extremity Assessment Lower Extremity Assessment: Overall WFL for tasks assessed   Cervical / Trunk Assessment Cervical / Trunk Assessment: Normal   Communication Communication Communication: No apparent difficulties   Cognition Arousal: Alert Behavior During Therapy: Restless, Impulsive Overall Cognitive Status: Impaired/Different from baseline Area of Impairment: Safety/judgement, Awareness, Problem solving, Following commands, Attention                   Current Attention Level: Divided   Following Commands: Follows one step commands inconsistently, Follows one step commands with increased time Safety/Judgement: Decreased awareness of safety, Decreased awareness of deficits Awareness: Emergent Problem Solving: Requires verbal cues, Requires tactile cues, Difficulty sequencing General Comments: oriented to person, place, time, situation but tangential speech and talks in circles     General Comments  pt accidentally  ripped IV out at end of session when returning to bed as she flails extremities throughout session-nurse notified and washcloth provided to hold where bleeding    Exercises Other Exercises Other Exercises: Edu on role of OT in acute setting and importance to maximize strength/safety/IND.   Shoulder Instructions      Home Living Family/patient expects to be discharged to:: Private residence Living Arrangements: Alone Available Help at Discharge: Family;Available PRN/intermittently;Neighbor Type of Home: House Home Access: Stairs to enter [employer] of Steps: 2-3   Home Layout: One level     Bathroom Shower/Tub: Runner, broadcasting/film/video: Shower seat - built in          Prior Functioning/Environment Prior Level of Function : Independent/Modified Independent;Driving             Mobility Comments: no AD use ADLs Comments: IND        OT Problem List: Decreased safety awareness;Impaired balance (sitting and/or standing)      OT Treatment/Interventions: Self-care/ADL training;Therapeutic exercise;Therapeutic activities;DME and/or AE instruction;Patient/family education;Balance training    OT Goals(Current goals can be found in the care plan section) Acute Rehab OT Goals Patient Stated Goal: improve balance and safety OT Goal Formulation: With patient Time For Goal Achievement: 05/22/23 Potential to Achieve Goals: Good  OT Frequency: Min 1X/week    Co-evaluation PT/OT/SLP Co-Evaluation/Treatment: Yes Reason for Co-Treatment: For patient/therapist safety;To address functional/ADL transfers PT  goals addressed during session: Mobility/safety with mobility;Balance OT goals addressed during session: ADL's and self-care      AM-PAC OT "6 Clicks" Daily Activity     Outcome Measure Help from another person eating meals?: None Help from another person taking care of personal grooming?: A Little Help from another person toileting, which includes  using toliet, bedpan, or urinal?: A Little Help from another person bathing (including washing, rinsing, drying)?: A Little Help from another person to put on and taking off regular upper body clothing?: A Little Help from another person to put on and taking off regular lower body clothing?: A Little 6 Click Score: 19   End of Session Equipment Utilized During Treatment: Gait belt Nurse Communication: Mobility status  Activity Tolerance: Patient tolerated treatment well Patient left: in bed;with call bell/phone within reach;with bed alarm set  OT Visit Diagnosis: Other abnormalities of gait and mobility (R26.89);Unsteadiness on feet (R26.81)                Time: 8657-8469 OT Time Calculation (min): 23 min Charges:  OT General Charges $OT Visit: 1 Visit OT Evaluation $OT Eval Moderate Complexity: 1 Mod Zairah Arista, OTR/L 05/08/23, 1:08 PM  Gryphon Vanderveen E Ryne Mctigue 05/08/2023, 1:03 PM

## 2023-05-08 NOTE — Progress Notes (Signed)
Patient received from ED, A&Ox4, helped to bathroom, will report to the night nurse

## 2023-05-08 NOTE — Evaluation (Signed)
Physical Therapy Evaluation Patient Details Name: Shannon Li MRN: [id number] DOB: 1950-09-24 Today's Date: 05/08/2023  History of Present Illness  Shannon Li is a 73 y.o. female with medical history significant of anxiety, COPD, depression, GERD, hypertension, IBS, CKD stage IIIb who was otherwise well until within the last 1 week when she started having headache with feeling of congestion and generalized weakness.  Patient followed up with her PCP and was thought to have sinusitis and prescribed antibiotic course.  According to patient she has also been experiencing on and off blurry vision with some numbness involving the right side of her face for the last several days.  Her PCP mailed a Zio patch to have which was placed yesterday.  She also noted that her gait was becoming abnormal with tendency to fall and therefore decided to come in for further management.  MRI reveals Small area of acute/early subacute ischemia at the left  parietooccipital junction.   Clinical Impression  Patient received in bed talking on phone. She is agreeable to PT/OT assessments. Patient very talkative, restless, impulsive. Tangential with speech and movements. She requires supervision for bed mobility, transfers and ambulates 125 feet with cga. Patient has poor safety awareness and is at increased fall risk. She will benefit from continued skilled PT to improve mobility and safety. Will need 24 hour supervision upon returning home for safety.           If plan is discharge home, recommend the following: A little help with walking and/or transfers;A little help with bathing/dressing/bathroom;Help with stairs or ramp for entrance;Assistance with cooking/housework;Direct supervision/assist for medications management;Supervision due to cognitive status   Can travel by private vehicle        Equipment Recommendations None recommended by PT  Recommendations for Other Services       Functional Status  Assessment Patient has had a recent decline in their functional status and demonstrates the ability to make significant improvements in function in a reasonable and predictable amount of time.     Precautions / Restrictions Precautions Precautions: Fall Restrictions Weight Bearing Restrictions Per Provider Order: No      Mobility  Bed Mobility Overal bed mobility: Needs Assistance Bed Mobility: Supine to Sit, Sit to Supine     Supine to sit: Supervision Sit to supine: Supervision   General bed mobility comments: patient restless/impulsive, requires supervision for safety with mobility    Transfers Overall transfer level: Needs assistance Equipment used: 1 person hand held assist Transfers: Sit to/from Stand Sit to Stand: Contact guard assist                Ambulation/Gait Ambulation/Gait assistance: Contact guard assist Gait Distance (Feet): 125 Feet Assistive device: 1 person hand held assist Gait Pattern/deviations: Step-through pattern, Drifts right/left Gait velocity: WFL     General Gait Details: patient requires cga/supervision for safety. Poor awareness  Stairs            Wheelchair Mobility     Tilt Bed    Modified Rankin (Stroke Patients Only)       Balance Overall balance assessment: Needs assistance Sitting-balance support: Feet supported Sitting balance-Leahy Scale: Fair     Standing balance support: Single extremity supported, During functional activity Standing balance-Leahy Scale: Fair                               Pertinent Vitals/Pain Pain Assessment Pain Assessment: No/denies pain  Home Living Family/patient expects to be discharged to:: Private residence Living Arrangements: Alone Available Help at Discharge: Family;Available PRN/intermittently;Neighbor Type of Home: House Home Access: Stairs to enter   Secretary/administrator of Steps: 2-3   Home Layout: One level Home Equipment: Shower seat -  built in      Prior Function Prior Level of Function : Independent/Modified Independent;Driving             Mobility Comments: no AD use ADLs Comments: IND     Extremity/Trunk Assessment   Upper Extremity Assessment Upper Extremity Assessment: Defer to OT evaluation    Lower Extremity Assessment Lower Extremity Assessment: Overall WFL for tasks assessed    Cervical / Trunk Assessment Cervical / Trunk Assessment: Normal  Communication   Communication Communication: No apparent difficulties  Cognition Arousal: Alert Behavior During Therapy: Restless, Impulsive Overall Cognitive Status: Impaired/Different from baseline Area of Impairment: Safety/judgement, Awareness, Problem solving, Following commands, Attention                   Current Attention Level: Divided   Following Commands: Follows one step commands inconsistently, Follows one step commands with increased time Safety/Judgement: Decreased awareness of safety, Decreased awareness of deficits Awareness: Emergent Problem Solving: Requires verbal cues, Requires tactile cues, Difficulty sequencing          General Comments      Exercises     Assessment/Plan    PT Assessment Patient needs continued PT services  PT Problem List Decreased coordination;Decreased cognition;Decreased safety awareness       PT Treatment Interventions DME instruction;Gait training;Stair training;Functional mobility training;Therapeutic activities;Therapeutic exercise;Balance training;Neuromuscular re-education;Cognitive remediation;Patient/family education    PT Goals (Current goals can be found in the Care Plan section)  Acute Rehab PT Goals Patient Stated Goal: to return home PT Goal Formulation: With patient Time For Goal Achievement: 05/21/23 Potential to Achieve Goals: Good    Frequency Min 1X/week     Co-evaluation PT/OT/SLP Co-Evaluation/Treatment: Yes Reason for Co-Treatment: For patient/therapist  safety;To address functional/ADL transfers PT goals addressed during session: Mobility/safety with mobility;Balance         AM-PAC PT "6 Clicks" Mobility  Outcome Measure Help needed turning from your back to your side while in a flat bed without using bedrails?: None Help needed moving from lying on your back to sitting on the side of a flat bed without using bedrails?: None Help needed moving to and from a bed to a chair (including a wheelchair)?: A Little Help needed standing up from a chair using your arms (e.g., wheelchair or bedside chair)?: A Little Help needed to walk in hospital room?: A Little Help needed climbing 3-5 steps with a railing? : A Little 6 Click Score: 20    End of Session Equipment Utilized During Treatment: Gait belt Activity Tolerance: Patient tolerated treatment well Patient left: in bed;with call bell/phone within reach;with bed alarm set;Other (comment) (floor mats down) Nurse Communication: Mobility status;Other (comment) (Patient pulled IV out unintentionally) PT Visit Diagnosis: Other abnormalities of gait and mobility (R26.89);Difficulty in walking, not elsewhere classified (R26.2)    Time: 1610-9604 PT Time Calculation (min) (ACUTE ONLY): 22 min   Charges:   PT Evaluation $PT Eval Moderate Complexity: 1 Mod   PT General Charges $$ ACUTE PT VISIT: 1 Visit         Draya Felker, PT, GCS 05/08/23,12:45 PM

## 2023-05-08 NOTE — ED Notes (Signed)
 Victorino Dike RN aware of assigned bed

## 2023-05-09 ENCOUNTER — Encounter: Payer: Self-pay | Admitting: Internal Medicine

## 2023-05-09 ENCOUNTER — Observation Stay
Admit: 2023-05-09 | Discharge: 2023-05-09 | Disposition: A | Discharge status: Home / Self Care | Payer: Medicare HMO | Attending: Internal Medicine | Admitting: Internal Medicine

## 2023-05-09 DIAGNOSIS — I6389 Other cerebral infarction: Secondary | ICD-10-CM | POA: Diagnosis not present

## 2023-05-09 DIAGNOSIS — R269 Unspecified abnormalities of gait and mobility: Secondary | ICD-10-CM | POA: Diagnosis not present

## 2023-05-09 LAB — CBC WITH DIFFERENTIAL/PLATELET
Abs Immature Granulocytes: 0.02 10*3/uL (ref 0.00–0.07)
Basophils Absolute: 0.1 10*3/uL (ref 0.0–0.1)
Basophils Relative: 1 %
Eosinophils Absolute: 0.2 10*3/uL (ref 0.0–0.5)
Eosinophils Relative: 3 %
HCT: 35.8 % — ABNORMAL LOW (ref 36.0–46.0)
Hemoglobin: 12.5 g/dL (ref 12.0–15.0)
Immature Granulocytes: 0 %
Lymphocytes Relative: 31 %
Lymphs Abs: 2.3 10*3/uL (ref 0.7–4.0)
MCH: 30 pg (ref 26.0–34.0)
MCHC: 34.9 g/dL (ref 30.0–36.0)
MCV: 86.1 fL (ref 80.0–100.0)
Monocytes Absolute: 0.6 10*3/uL (ref 0.1–1.0)
Monocytes Relative: 8 %
Neutro Abs: 4 10*3/uL (ref 1.7–7.7)
Neutrophils Relative %: 57 %
Platelets: 227 10*3/uL (ref 150–400)
RBC: 4.16 MIL/uL (ref 3.87–5.11)
RDW: 13.5 % (ref 11.5–15.5)
WBC: 7.2 10*3/uL (ref 4.0–10.5)
nRBC: 0 % (ref 0.0–0.2)

## 2023-05-09 LAB — BASIC METABOLIC PANEL WITH GFR
Anion gap: 11 (ref 5–15)
BUN: 29 mg/dL — ABNORMAL HIGH (ref 8–23)
CO2: 22 mmol/L (ref 22–32)
Calcium: 8.5 mg/dL — ABNORMAL LOW (ref 8.9–10.3)
Chloride: 104 mmol/L (ref 98–111)
Creatinine, Ser: 1.61 mg/dL — ABNORMAL HIGH (ref 0.44–1.00)
GFR, Estimated: 33 mL/min — ABNORMAL LOW
Glucose, Bld: 80 mg/dL (ref 70–99)
Potassium: 3.6 mmol/L (ref 3.5–5.1)
Sodium: 137 mmol/L (ref 135–145)

## 2023-05-09 LAB — ECHOCARDIOGRAM COMPLETE
AR max vel: 1.74 cm2
AV Peak grad: 7 mmHg
Ao pk vel: 1.32 m/s
Area-P 1/2: 4.01 cm2
Height: 68 in
S' Lateral: 3.3 cm
Weight: 2673.74 [oz_av]

## 2023-05-09 MED ORDER — DULOXETINE HCL 20 MG PO CPEP: 20.0000 mg | ORAL_CAPSULE | Freq: Two times a day (BID) | ORAL | 1 refills | Status: DC

## 2023-05-09 MED ORDER — ROSUVASTATIN CALCIUM 10 MG PO TABS: 10.0000 mg | ORAL_TABLET | Freq: Every day | ORAL | 6 refills | Status: DC

## 2023-05-09 MED ORDER — CLOPIDOGREL BISULFATE 75 MG PO TABS: 75.0000 mg | ORAL_TABLET | Freq: Every day | ORAL | 0 refills | Status: AC

## 2023-05-09 MED ORDER — ASPIRIN 81 MG PO TBEC: 81.0000 mg | DELAYED_RELEASE_TABLET | Freq: Every day | ORAL | 12 refills | Status: DC

## 2023-05-09 NOTE — TOC Transition Note (Signed)
Transition of Care [facility]) - Discharge Note   Patient Details  Name: Shannon Li MRN: [id number] Date of Birth: 28-Jul-1950  Transition of Care [facility] CM/SW Contact:  Bing Quarry, RN Phone Number: 05/09/2023, 4:24 PM   Clinical Narrative:   1/19: Patient now discharging to home with [facility]. Provider stated he spoke to niece to confirm 24/7 supervision. [facility] secured for [facility] as patient had no preference and no HH before. RW ordered via [employer] but insurance is coming back invalid. [employer] will contact niece regarding direct payment or a plan. Niece will provide transportation to her home or to patient home and stay with her per provider d/t high fall risk secondary to ischemic CVA and balance issues related to such.   Gabriel Cirri MSN RN CM  RN Case Manager [place]  Transitions of Care Direct Dial: [phone] (Weekends Only) [facility] Main Office Phone: [phone] [facility] Fax: [phone] .[email or web address]     Final next level of care: Home w Home Health Services Barriers to Discharge: Barriers Resolved   Patient Goals and CMS Choice     Choice offered to / list presented to :  (no preference on [facility] when spoke to pt about [facility])      Discharge Placement                       Discharge Plan and Services Additional resources added to the After Visit Summary for                  DME Arranged: Walker rolling DME Agency: [employer]                  Social Drivers of Health (SDOH) Interventions SDOH Screenings   Food Insecurity: No Food Insecurity (05/08/2023)  Housing: Low Risk  (05/08/2023)  Transportation Needs: No Transportation Needs (05/08/2023)  Utilities: Not At Risk (05/08/2023)  Alcohol Screen: Low Risk  (02/06/2021)  Depression (PHQ2-9): Low Risk  (12/24/2022)  Financial Resource Strain: Low Risk  (10/15/2020)  Social Connections: Patient Declined (05/08/2023)  Tobacco Use: High Risk (05/09/2023)     Readmission Risk Interventions     No data to  display

## 2023-05-09 NOTE — Plan of Care (Signed)
  Problem: Education: Goal: Knowledge of General Education information will improve Description: Including pain rating scale, medication(s)/side effects and non-pharmacologic comfort measures Outcome: Progressing   Problem: Health Behavior/Discharge Planning: Goal: Ability to manage health-related needs will improve Outcome: Progressing   Problem: Clinical Measurements: Goal: Ability to maintain clinical measurements within normal limits will improve Outcome: Progressing Goal: Will remain free from infection Outcome: Progressing Goal: Diagnostic test results will improve Outcome: Progressing Goal: Respiratory complications will improve Outcome: Progressing Goal: Cardiovascular complication will be avoided Outcome: Progressing   Problem: Activity: Goal: Risk for activity intolerance will decrease Outcome: Progressing   Problem: Nutrition: Goal: Adequate nutrition will be maintained Outcome: Progressing   Problem: Coping: Goal: Level of anxiety will decrease Outcome: Progressing   Problem: Elimination: Goal: Will not experience complications related to bowel motility Outcome: Progressing Goal: Will not experience complications related to urinary retention Outcome: Progressing   Problem: Pain Managment: Goal: General experience of comfort will improve and/or be controlled Outcome: Progressing   Problem: Safety: Goal: Ability to remain free from injury will improve Outcome: Progressing   Problem: Skin Integrity: Goal: Risk for impaired skin integrity will decrease Outcome: Progressing   Problem: Education: Goal: Knowledge of disease or condition will improve Outcome: Progressing Goal: Knowledge of secondary prevention will improve (MUST DOCUMENT ALL) Outcome: Progressing Goal: Knowledge of patient specific risk factors will improve (DELETE if not current risk factor) Outcome: Progressing   Problem: Ischemic Stroke/TIA Tissue Perfusion: Goal: Complications of  ischemic stroke/TIA will be minimized Outcome: Progressing   Problem: Coping: Goal: Will verbalize positive feelings about self Outcome: Progressing Goal: Will identify appropriate support needs Outcome: Progressing   Problem: Health Behavior/Discharge Planning: Goal: Ability to manage health-related needs will improve Outcome: Progressing Goal: Goals will be collaboratively established with patient/family Outcome: Progressing   Problem: Self-Care: Goal: Ability to participate in self-care as condition permits will improve Outcome: Progressing Goal: Verbalization of feelings and concerns over difficulty with self-care will improve Outcome: Progressing Goal: Ability to communicate needs accurately will improve Outcome: Progressing   Problem: Nutrition: Goal: Risk of aspiration will decrease Outcome: Progressing

## 2023-05-09 NOTE — Discharge Summary (Signed)
Physician Discharge Summary   Patient: Shannon Li MRN: [id number] DOB: 1951/01/28  Admit date:     05/07/2023  Discharge date: 05/09/23  Discharge Physician: Loyce Dys   PCP: Carlean Jews, PA-C   Recommendations at discharge:  Follow-up with neurologist  Discharge Diagnoses: Acute ischemic stroke  AKI on CKD stage IIIb COPD Depression GERD Hypertension  Hospital Course: Shannon Li is a 73 y.o. female with medical history significant of anxiety, COPD, depression, GERD, hypertension, IBS, CKD stage IIIb who was otherwise well until within the last 1 week when she started having headache with feeling of congestion and generalized weakness.  Patient followed up with her PCP and was thought to have sinusitis and prescribed antibiotic course.  According to patient she has also been experiencing on and off blurry vision with some numbness involving the right side of her face for the last several days.  Her physician mailed a Zio patch to have which was placed the day before presentation.  She also noted that her gait was becoming abnormal with tendency to fall and therefore decided to come in for further management.  On admission MRI done showed a left-sided parietal occipital infarct.  Patient was seen by neurologist echo reported normal with recommendation to complete 3 months of dual antiplatelet therapy as well as statin therapy and to follow-up with outpatient neurologist.  PT OT also saw patient and recommended home health.  I discussed this with patient and the family concerning the need to have someone around patient 24/7 and they have made arrangements for that.  Consultants: Neurology Procedures performed: None Disposition: Home health Diet recommendation:  Discharge Diet Orders (From admission, onward)     Start     Ordered   05/09/23 0000  Diet - low sodium heart healthy        05/09/23 1556           Cardiac diet DISCHARGE MEDICATION: Allergies as  of 05/09/2023       Reactions   Lipitor [atorvastatin] Other (See Comments)   Muscle pain and stiffness   Bupropion Nausea And Vomiting   Penicillin V Potassium Other (See Comments)   Penicillins Hives        Medication List     STOP taking these medications    bisoprolol-hydrochlorothiazide 2.5-6.25 MG tablet Commonly known as: ZIAC       TAKE these medications    amLODipine 5 MG tablet Commonly known as: NORVASC TAKE 1 TABLET (5 MG TOTAL) BY MOUTH DAILY.   aspirin EC 81 MG tablet Take 1 tablet (81 mg total) by mouth daily. Swallow whole. Start taking on: May 10, 2023   clopidogrel 75 MG tablet Commonly known as: PLAVIX Take 1 tablet (75 mg total) by mouth daily. Start taking on: May 10, 2023   doxycycline 100 MG tablet Commonly known as: VIBRA-TABS Take 1 tablet (100 mg total) by mouth 2 (two) times daily.   DULoxetine 20 MG capsule Commonly known as: CYMBALTA Take 1 capsule (20 mg total) by mouth 2 (two) times daily.   ezetimibe 10 MG tablet Commonly known as: ZETIA TAKE 1 TABLET BY MOUTH EVERY DAY   famotidine 40 MG tablet Commonly known as: PEPCID Take 40 mg by mouth daily.   fluticasone furoate-vilanterol 100-25 MCG/ACT Aepb Commonly known as: BREO ELLIPTA Inhale 1 puff into the lungs daily.   pantoprazole 40 MG tablet Commonly known as: PROTONIX TAKE 1 TABLET BY MOUTH DAILY   rosuvastatin 10 MG tablet  Commonly known as: CRESTOR Take 1 tablet (10 mg total) by mouth daily. Start taking on: May 10, 2023   triamcinolone cream 0.1 % Commonly known as: KENALOG Apply 1 Application topically 2 (two) times daily.               Durable Medical Equipment  (From admission, onward)           Start     Ordered   05/09/23 1606  For home use only DME Walker rolling  Once       Question Answer Comment  Walker: With 5 Inch Wheels   Patient needs a walker to treat with the following condition Ambulatory dysfunction       05/09/23 1606            Follow-up Information     Lonell Face, MD Follow up.   Specialty: Neurology Contact information: [phone] [facility] [address] [facility] [place] [address] [phone]                Discharge Exam: Ceasar Mons Weights   05/07/23 1104  Weight: 75.8 kg   General: Elderly female laying in bed alert and awake and oriented Respiratory: Clear to auscultation Musculoskeletal: Moving all extremities CNS: Oriented x 3 CVS: S1 and S2 present no murmur head Abdomen: Full but soft no organ palpable  Condition at discharge: good  The results of significant diagnostics from this hospitalization (including imaging, microbiology, ancillary and laboratory) are listed below for reference.   Imaging Studies: ECHOCARDIOGRAM COMPLETE Result Date: 05/09/2023    ECHOCARDIOGRAM REPORT   Patient Name:   Shannon Li Date of Exam: 05/09/2023 Medical Rec #:  [id number]         Height:       68.0 in Accession #:    [id number]        Weight:       167.1 lb Date of Birth:  09-25-1950         BSA:          1.893 m Patient Age:    72 years          BP:           139/70 mmHg Patient Gender: F                 HR:           62 bpm. Exam Location:  [facility] Procedure: 2D Echo Indications:     Stroke I63.9  History:         Patient has prior history of Echocardiogram examinations, most                  recent 10/16/2019.  Sonographer:     Overton Mam RDCS, FASE Referring Phys:  [id number] Loyce Dys Diagnosing Phys: Windell Norfolk  Sonographer Comments: Suboptimal subcostal window. Image acquisition challenging due to respiratory motion. IMPRESSIONS  1. Left ventricular ejection fraction, by estimation, is 60 to 65%. The left ventricle has normal function. The left ventricle has no regional wall motion abnormalities. Left ventricular diastolic parameters were normal.  2. Right ventricular systolic function is normal. The right ventricular size is normal.  3. The  mitral valve is normal in structure. Trivial mitral valve regurgitation.  4. The aortic valve is tricuspid. Aortic valve regurgitation is not visualized.  5. The inferior vena cava is normal in size with greater than 50% respiratory variability, suggesting right atrial pressure of 3 mmHg. FINDINGS  Left  Ventricle: Left ventricular ejection fraction, by estimation, is 60 to 65%. The left ventricle has normal function. The left ventricle has no regional wall motion abnormalities. The left ventricular internal cavity size was normal in size. There is  no left ventricular hypertrophy. Left ventricular diastolic parameters were normal. Right Ventricle: The right ventricular size is normal. No increase in right ventricular wall thickness. Right ventricular systolic function is normal. Left Atrium: Left atrial size was normal in size. Right Atrium: Right atrial size was normal in size. Pericardium: There is no evidence of pericardial effusion. Mitral Valve: The mitral valve is normal in structure. Trivial mitral valve regurgitation. Tricuspid Valve: The tricuspid valve is normal in structure. Tricuspid valve regurgitation is not demonstrated. Aortic Valve: The aortic valve is tricuspid. Aortic valve regurgitation is not visualized. Aortic valve peak gradient measures 7.0 mmHg. Pulmonic Valve: The pulmonic valve was not well visualized. Pulmonic valve regurgitation is not visualized. Aorta: The aortic root is normal in size and structure. Venous: The inferior vena cava is normal in size with greater than 50% respiratory variability, suggesting right atrial pressure of 3 mmHg. IAS/Shunts: The atrial septum is grossly normal.  LEFT VENTRICLE PLAX 2D LVIDd:         4.70 cm   Diastology LVIDs:         3.30 cm   LV e' medial:    7.29 cm/s LV PW:         1.00 cm   LV E/e' medial:  10.2 LV IVS:        1.00 cm   LV e' lateral:   10.10 cm/s LVOT diam:     2.00 cm   LV E/e' lateral: 7.4 LV SV:         51 LV SV Index:   27 LVOT Area:      3.14 cm  RIGHT VENTRICLE RV Basal diam:  2.60 cm RV S prime:     15.40 cm/s TAPSE (M-mode): 2.5 cm LEFT ATRIUM             Index        RIGHT ATRIUM           Index LA diam:        2.70 cm 1.43 cm/m   RA Area:     14.10 cm LA Vol (A2C):   34.9 ml 18.43 ml/m  RA Volume:   34.60 ml  18.27 ml/m LA Vol (A4C):   28.4 ml 15.00 ml/m LA Biplane Vol: 31.9 ml 16.85 ml/m  AORTIC VALVE                 PULMONIC VALVE AV Area (Vmax): 1.74 cm     PV Vmax:        0.68 m/s AV Vmax:        132.00 cm/s  PV Peak grad:   1.8 mmHg AV Peak Grad:   7.0 mmHg     RVOT Peak grad: 2 mmHg LVOT Vmax:      73.10 cm/s LVOT Vmean:     45.400 cm/s LVOT VTI:       0.163 m  AORTA Ao Root diam: 3.00 cm MITRAL VALVE MV Area (PHT): 4.01 cm    SHUNTS MV Decel Time: 189 msec    Systemic VTI:  0.16 m MV E velocity: 74.60 cm/s  Systemic Diam: 2.00 cm MV A velocity: 76.30 cm/s MV E/A ratio:  0.98 Windell Norfolk Electronically signed by Windell Norfolk Signature Date/Time: 05/09/2023/3:13:24 PM  Final    MR ANGIO HEAD WO CONTRAST Result Date: 05/07/2023 CLINICAL DATA:  Acute neurologic deficit EXAM: MRI HEAD WITHOUT CONTRAST MRA HEAD WITHOUT CONTRAST MRA NECK WITHOUT CONTRAST TECHNIQUE: Multiplanar, multiecho pulse sequences of the brain and surrounding structures were obtained without intravenous contrast. Angiographic images of the Circle of Willis were obtained using MRA technique without intravenous contrast. Angiographic images of the neck were obtained using MRA technique without intravenous contrast. Carotid stenosis measurements (when applicable) are obtained utilizing NASCET criteria, using the distal internal carotid diameter as the denominator. COMPARISON:  None Available. FINDINGS: MRI HEAD FINDINGS Brain: There is a small area of acute/early subacute ischemia at the left parietooccipital junction. No acute or chronic hemorrhage. There is multifocal hyperintense T2-weighted signal within the white matter. Parenchymal volume and CSF  spaces are normal. The midline structures are normal. Vascular: Normal flow voids. Skull and upper cervical spine: Normal calvarium and skull base. Visualized upper cervical spine and soft tissues are normal. Sinuses/Orbits:No paranasal sinus fluid levels or advanced mucosal thickening. No mastoid or middle ear effusion. Normal orbits. MRA HEAD FINDINGS POSTERIOR CIRCULATION: Vertebral arteries are normal. No proximal occlusion of the anterior or inferior cerebellar arteries. Basilar artery is normal. Superior cerebellar arteries are normal. Posterior cerebral arteries are normal. ANTERIOR CIRCULATION: Intracranial internal carotid arteries are normal. Anterior cerebral arteries are normal. There is severe stenosis of the proximal left M1 segment. The distal left MCA is normal. Normal right MCA. Anatomic Variants: None MRA NECK FINDINGS There is symmetric signal loss of the internal carotid arteries at the proximal petrous segments, likely a motion/flow direction artifact. The cerebrovascular vessels are otherwise normal. IMPRESSION: 1. Small area of acute/early subacute ischemia at the left parietooccipital junction. No hemorrhage or mass effect. 2. Severe stenosis of the proximal left M1 segment. 3. Symmetric signal loss of the internal carotid arteries at the proximal petrous segments, likely a motion/flow direction artifact. Electronically Signed   By: Deatra Robinson M.D.   On: 05/07/2023 21:21   MR ANGIO NECK WO CONTRAST Result Date: 05/07/2023 CLINICAL DATA:  Acute neurologic deficit EXAM: MRI HEAD WITHOUT CONTRAST MRA HEAD WITHOUT CONTRAST MRA NECK WITHOUT CONTRAST TECHNIQUE: Multiplanar, multiecho pulse sequences of the brain and surrounding structures were obtained without intravenous contrast. Angiographic images of the Circle of Willis were obtained using MRA technique without intravenous contrast. Angiographic images of the neck were obtained using MRA technique without intravenous contrast. Carotid  stenosis measurements (when applicable) are obtained utilizing NASCET criteria, using the distal internal carotid diameter as the denominator. COMPARISON:  None Available. FINDINGS: MRI HEAD FINDINGS Brain: There is a small area of acute/early subacute ischemia at the left parietooccipital junction. No acute or chronic hemorrhage. There is multifocal hyperintense T2-weighted signal within the white matter. Parenchymal volume and CSF spaces are normal. The midline structures are normal. Vascular: Normal flow voids. Skull and upper cervical spine: Normal calvarium and skull base. Visualized upper cervical spine and soft tissues are normal. Sinuses/Orbits:No paranasal sinus fluid levels or advanced mucosal thickening. No mastoid or middle ear effusion. Normal orbits. MRA HEAD FINDINGS POSTERIOR CIRCULATION: Vertebral arteries are normal. No proximal occlusion of the anterior or inferior cerebellar arteries. Basilar artery is normal. Superior cerebellar arteries are normal. Posterior cerebral arteries are normal. ANTERIOR CIRCULATION: Intracranial internal carotid arteries are normal. Anterior cerebral arteries are normal. There is severe stenosis of the proximal left M1 segment. The distal left MCA is normal. Normal right MCA. Anatomic Variants: None MRA NECK FINDINGS There is symmetric signal  loss of the internal carotid arteries at the proximal petrous segments, likely a motion/flow direction artifact. The cerebrovascular vessels are otherwise normal. IMPRESSION: 1. Small area of acute/early subacute ischemia at the left parietooccipital junction. No hemorrhage or mass effect. 2. Severe stenosis of the proximal left M1 segment. 3. Symmetric signal loss of the internal carotid arteries at the proximal petrous segments, likely a motion/flow direction artifact. Electronically Signed   By: Deatra Robinson M.D.   On: 05/07/2023 21:21   MR BRAIN WO CONTRAST Result Date: 05/07/2023 CLINICAL DATA:  Acute neurologic deficit  EXAM: MRI HEAD WITHOUT CONTRAST MRA HEAD WITHOUT CONTRAST MRA NECK WITHOUT CONTRAST TECHNIQUE: Multiplanar, multiecho pulse sequences of the brain and surrounding structures were obtained without intravenous contrast. Angiographic images of the Circle of Willis were obtained using MRA technique without intravenous contrast. Angiographic images of the neck were obtained using MRA technique without intravenous contrast. Carotid stenosis measurements (when applicable) are obtained utilizing NASCET criteria, using the distal internal carotid diameter as the denominator. COMPARISON:  None Available. FINDINGS: MRI HEAD FINDINGS Brain: There is a small area of acute/early subacute ischemia at the left parietooccipital junction. No acute or chronic hemorrhage. There is multifocal hyperintense T2-weighted signal within the white matter. Parenchymal volume and CSF spaces are normal. The midline structures are normal. Vascular: Normal flow voids. Skull and upper cervical spine: Normal calvarium and skull base. Visualized upper cervical spine and soft tissues are normal. Sinuses/Orbits:No paranasal sinus fluid levels or advanced mucosal thickening. No mastoid or middle ear effusion. Normal orbits. MRA HEAD FINDINGS POSTERIOR CIRCULATION: Vertebral arteries are normal. No proximal occlusion of the anterior or inferior cerebellar arteries. Basilar artery is normal. Superior cerebellar arteries are normal. Posterior cerebral arteries are normal. ANTERIOR CIRCULATION: Intracranial internal carotid arteries are normal. Anterior cerebral arteries are normal. There is severe stenosis of the proximal left M1 segment. The distal left MCA is normal. Normal right MCA. Anatomic Variants: None MRA NECK FINDINGS There is symmetric signal loss of the internal carotid arteries at the proximal petrous segments, likely a motion/flow direction artifact. The cerebrovascular vessels are otherwise normal. IMPRESSION: 1. Small area of acute/early  subacute ischemia at the left parietooccipital junction. No hemorrhage or mass effect. 2. Severe stenosis of the proximal left M1 segment. 3. Symmetric signal loss of the internal carotid arteries at the proximal petrous segments, likely a motion/flow direction artifact. Electronically Signed   By: Deatra Robinson M.D.   On: 05/07/2023 21:21   CT Head Wo Contrast Result Date: 05/07/2023 CLINICAL DATA:  Neuro deficit, acute, stroke suspected. Mental status change, unknown cause. EXAM: CT HEAD WITHOUT CONTRAST TECHNIQUE: Contiguous axial images were obtained from the base of the skull through the vertex without intravenous contrast. RADIATION DOSE REDUCTION: This exam was performed according to the departmental dose-optimization program which includes automated exposure control, adjustment of the mA and/or kV according to patient size and/or use of iterative reconstruction technique. COMPARISON:  Brain MRI 12/08/2008. FINDINGS: Brain: Mild generalized cerebral volume loss. Mild patchy and ill-defined hypoattenuation within the cerebral white matter, nonspecific but most often secondary to chronic small vessel ischemia. There is no acute intracranial hemorrhage. No demarcated cortical infarct. No extra-axial fluid collection. No evidence of an intracranial mass. No midline shift. Vascular: No hyperdense vessel.  Atherosclerotic calcifications. Skull: No calvarial fracture or aggressive osseous lesion. Sinuses/Orbits: No mass or acute finding within the imaged orbits. No significant paranasal sinus disease. IMPRESSION: 1.  No evidence of an acute intracranial abnormality. 2. Mild cerebral white  matter disease, nonspecific but most often secondary to chronic small vessel ischemia. 3. Mild generalized cerebral atrophy. 4. Trace fluid within the bilateral mastoid air cells. Electronically Signed   By: Jackey Loge D.O.   On: 05/07/2023 17:26   DG Chest 2 View Result Date: 05/07/2023 CLINICAL DATA:  Chest pain. EXAM:  CHEST - 2 VIEW COMPARISON:  09/18/2015. FINDINGS: Bilateral lung fields are clear. Bilateral costophrenic angles are clear. Normal cardio-mediastinal silhouette. Cardiac monitor noted overlying the left upper anterior chest wall. No acute osseous abnormalities. The soft tissues are within normal limits. IMPRESSION: No active cardiopulmonary disease. Electronically Signed   By: Jules Schick M.D.   On: 05/07/2023 13:14    Microbiology: Results for orders placed or performed during the hospital encounter of 05/07/23  Resp panel by RT-PCR (RSV, Flu A&B, Covid) Anterior Nasal Swab     Status: None   Collection Time: 05/08/23  5:46 AM   Specimen: Anterior Nasal Swab  Result Value Ref Range Status   SARS Coronavirus 2 by RT PCR NEGATIVE NEGATIVE Final    Comment: (NOTE) SARS-CoV-2 target nucleic acids are NOT DETECTED.  The SARS-CoV-2 RNA is generally detectable in upper respiratory specimens during the acute phase of infection. The lowest concentration of SARS-CoV-2 viral copies this assay can detect is 138 copies/mL. A negative result does not preclude SARS-Cov-2 infection and should not be used as the sole basis for treatment or other patient management decisions. A negative result may occur with  improper specimen collection/handling, submission of specimen other than nasopharyngeal swab, presence of viral mutation(s) within the areas targeted by this assay, and inadequate number of viral copies(<138 copies/mL). A negative result must be combined with clinical observations, patient history, and epidemiological information. The expected result is Negative.  Fact Sheet for Patients:  BloggerCourse.com  Fact Sheet for Healthcare Providers:  SeriousBroker.it  This test is no t yet approved or cleared by the [place] FDA and  has been authorized for detection and/or diagnosis of SARS-CoV-2 by FDA under an Emergency Use Authorization  (EUA). This EUA will remain  in effect (meaning this test can be used) for the duration of the COVID-19 declaration under Section 564(b)(1) of the Act, 21 U.S.C.section 360bbb-3(b)(1), unless the authorization is terminated  or revoked sooner.       Influenza A by PCR NEGATIVE NEGATIVE Final   Influenza B by PCR NEGATIVE NEGATIVE Final    Comment: (NOTE) The Xpert Xpress SARS-CoV-2/FLU/RSV plus assay is intended as an aid in the diagnosis of influenza from Nasopharyngeal swab specimens and should not be used as a sole basis for treatment. Nasal washings and aspirates are unacceptable for Xpert Xpress SARS-CoV-2/FLU/RSV testing.  Fact Sheet for Patients: BloggerCourse.com  Fact Sheet for Healthcare Providers: SeriousBroker.it  This test is not yet approved or cleared by the [place] FDA and has been authorized for detection and/or diagnosis of SARS-CoV-2 by FDA under an Emergency Use Authorization (EUA). This EUA will remain in effect (meaning this test can be used) for the duration of the COVID-19 declaration under Section 564(b)(1) of the Act, 21 U.S.C. section 360bbb-3(b)(1), unless the authorization is terminated or revoked.     Resp Syncytial Virus by PCR NEGATIVE NEGATIVE Final    Comment: (NOTE) Fact Sheet for Patients: BloggerCourse.com  Fact Sheet for Healthcare Providers: SeriousBroker.it  This test is not yet approved or cleared by the [place] FDA and has been authorized for detection and/or diagnosis of SARS-CoV-2 by FDA under an Emergency Use  Authorization (EUA). This EUA will remain in effect (meaning this test can be used) for the duration of the COVID-19 declaration under Section 564(b)(1) of the Act, 21 U.S.C. section 360bbb-3(b)(1), unless the authorization is terminated or revoked.  Performed at [facility], [address]  [place], [place] [address]     Labs: CBC: Recent Labs  Lab 05/07/23 1111 05/08/23 0546 05/09/23 0426  WBC 7.1 7.6 7.2  NEUTROABS  --   --  4.0  HGB 14.7 13.1 12.5  HCT 43.4 38.8 35.8*  MCV 88.6 87.4 86.1  PLT 296 265 227   Basic Metabolic Panel: Recent Labs  Lab 05/07/23 1111 05/08/23 0546 05/09/23 0426  NA 135 136 137  K 3.8 4.0 3.6  CL 99 105 104  CO2 20* 20* 22  GLUCOSE 119* 87 80  BUN 31* 26* 29*  CREATININE 2.11* 1.67* 1.61*  CALCIUM 9.2 8.6* 8.5*   Liver Function Tests: No results for input(s): "AST", "ALT", "ALKPHOS", "BILITOT", "PROT", "ALBUMIN" in the last 168 hours. CBG: No results for input(s): "GLUCAP" in the last 168 hours.  Discharge time spent:  37 minutes.  Signed: Loyce Dys, MD [facility] 05/09/2023

## 2023-05-09 NOTE — Plan of Care (Signed)
Patient alert & oriented x 4. Cooperative and appropriate with staff. Educated on falls precautions. Med compliant, LBM 1/18. Denies pain, dizziness. All safety precautions continued.  Problem: Clinical Measurements: Goal: Ability to maintain clinical measurements within normal limits will improve Outcome: Progressing Goal: Will remain free from infection Outcome: Progressing Goal: Respiratory complications will improve Outcome: Progressing   Problem: Activity: Goal: Risk for activity intolerance will decrease Outcome: Progressing   Problem: Nutrition: Goal: Adequate nutrition will be maintained Outcome: Progressing   Problem: Coping: Goal: Level of anxiety will decrease Outcome: Progressing   Problem: Elimination: Goal: Will not experience complications related to bowel motility Outcome: Progressing Goal: Will not experience complications related to urinary retention Outcome: Progressing   Problem: Pain Managment: Goal: General experience of comfort will improve and/or be controlled Outcome: Progressing   Problem: Skin Integrity: Goal: Risk for impaired skin integrity will decrease Outcome: Progressing   Problem: Education: Goal: Knowledge of disease or condition will improve Outcome: Progressing   Problem: Coping: Goal: Will verbalize positive feelings about self Outcome: Progressing   Problem: Self-Care: Goal: Ability to participate in self-care as condition permits will improve Outcome: Progressing Goal: Verbalization of feelings and concerns over difficulty with self-care will improve Outcome: Progressing

## 2023-05-09 NOTE — Care Management Obs Status (Signed)
MEDICARE OBSERVATION STATUS NOTIFICATION   Patient Details  Name: Shannon Li MRN: [id number] Date of Birth: 1950/05/10   Medicare Observation Status Notification Given:  Yes (Explained in person at bedside, patient physicaly signed form after oral explanation, placed in hard chart with patient label attached.)    Bing Quarry, RN 05/09/2023, 3:33 PM

## 2023-05-09 NOTE — TOC Initial Note (Signed)
Transition of Care [facility]) - Initial/Assessment Note    Patient Details  Name: Shannon Li MRN: [id number] Date of Birth: 11-05-1950  Transition of Care [facility]) CM/SW Contact:    Bing Quarry, RN Phone Number: 05/09/2023, 3:36 PM  Clinical Narrative:                 Transition of Care [facility]) - Inpatient Brief Assessment   Patient Details  Name: Shannon Li MRN: [id number] Date of Birth: 1950-10-28  Transition of Care [facility]) CM/SW Contact:    Bing Quarry, RN Phone Number: 05/09/2023, 3:36 PM   Clinical Narrative: 1/19: Patient admitted due to balance issues  to ED by niece, Sheralyn Boatman, with cardiac monitor from cardiologist office visit. Neurology consult with some cerebral vascular changes. Patient alert and oriented but is having some expressive speech issues. Right side limbs overlay active but can move left side spontaneously and to command. Patient lives alone, but states she could have some help at home on discharge for several days, but understand she is a high fall risk with balance issue, may need 24 hour supervision per PT, and is not opposed to the possibility of STR/SNF. She has never been at a facility and was told a list would be provided if that became the recommendation. She is experiencing some blurry vision at times now. She was basically independent PTA.  Pet PT evaluation notes. Prior Level of Function: Independent/Modified Independent;Driving Mobility Comments: no AD use ADLs Comments: IND Patient has poor safety awareness and is at increased fall risk. She will benefit from continued skilled PT to improve mobility and safety.  Will need 24 hour supervision upon returning home for safety.       Patient has a PCP and a cardiologist.  TOC will continue to monitor for needs and final disposition.     Transition of Care Asessment: Insurance and Status: Insurance coverage has been reviewed Patient has primary care physician: Yes Home environment has been  reviewed: From home where lives alone per patient. Prior level of function:: Prior Level of Function : Independent/Modified Independent;Driving  Mobility Comments: no AD use  ADLs Comments: IND--per PT evaluation notes. Prior/Current Home Services: No current home services Social Drivers of Health Review: SDOH reviewed no interventions necessary Readmission risk has been reviewed: Yes (Has not been calculated yet.) Transition of care needs: transition of care needs identified, TOC will continue to follow        Patient Goals and CMS Choice            Expected Discharge Plan and Services                                              Prior Living Arrangements/Services                       Activities of Daily Living   ADL Screening (condition at time of admission) Independently performs ADLs?: Yes (appropriate for developmental age) Is the patient deaf or have difficulty hearing?: No Does the patient have difficulty seeing, even when wearing glasses/contacts?: Yes Does the patient have difficulty concentrating, remembering, or making decisions?: Yes  Permission Sought/Granted                  Emotional Assessment  Admission diagnosis:  Abnormal gait [R26.9] Gait instability [R26.81] Dizzy [R42] Acute nonintractable headache, unspecified headache type [R51.9] Patient Active Problem List   Diagnosis Date Noted   Abnormal gait 05/07/2023   Impingement syndrome of left shoulder region 02/08/2021   Needs flu shot 03/04/2020   Cervical disc disease with myelopathy 10/28/2019   Degenerative disc disease, thoracic 10/28/2019   Gastroesophageal reflux disease without esophagitis 10/03/2018   Generalized anxiety disorder 10/03/2018   Chronic obstructive pulmonary disease (HCC) 09/18/2018   Acute upper respiratory infection 03/03/2018   Cough 03/03/2018   Bereavement due to life event 03/03/2018   Encounter for general adult medical  examination with abnormal findings 08/24/2017   Dyspnea on exertion 08/24/2017   Dysuria 08/24/2017   Need for vaccination against Streptococcus pneumoniae using pneumococcal conjugate vaccine 13 08/24/2017   Essential hypertension 06/02/2017   Inguinal abscess 05/23/2014   Cutaneous abscess of groin 05/23/2014   Cellulitis and abscess of trunk 05/23/2014   Ovarian cyst, complex 04/06/2012   Other ovarian cyst, unspecified side 04/06/2012   Ovarian retention cyst 04/06/2012   PCP:  Carlean Jews, PA-C Pharmacy:   [facility] [address] [place] - [address] [place] [address] Phone: [phone] Fax: [phone]  [facility] [place], [place] - [address] [place] [address] [place] [address] Phone: [phone] Fax: [phone]     Social Drivers of Health (SDOH) Social History: SDOH Screenings   Food Insecurity: No Food Insecurity (05/08/2023)  Housing: Low Risk  (05/08/2023)  Transportation Needs: No Transportation Needs (05/08/2023)  Utilities: Not At Risk (05/08/2023)  Alcohol Screen: Low Risk  (02/06/2021)  Depression (PHQ2-9): Low Risk  (12/24/2022)  Financial Resource Strain: Low Risk  (10/15/2020)  Social Connections: Patient Declined (05/08/2023)  Tobacco Use: High Risk (05/09/2023)   SDOH Interventions:     Readmission Risk Interventions     No data to display

## 2023-05-09 NOTE — Progress Notes (Signed)
  Echocardiogram 2D Echocardiogram has been performed.  Shannon Li 05/09/2023, 9:48 AM

## 2023-05-11 ENCOUNTER — Telehealth: Payer: Self-pay

## 2023-05-17 ENCOUNTER — Telehealth: Payer: Self-pay | Admitting: Physician Assistant

## 2023-05-17 ENCOUNTER — Telehealth: Payer: Self-pay

## 2023-05-17 ENCOUNTER — Encounter: Payer: Self-pay | Admitting: Physician Assistant

## 2023-05-17 ENCOUNTER — Ambulatory Visit (INDEPENDENT_AMBULATORY_CARE_PROVIDER_SITE_OTHER): Payer: Medicare HMO | Admitting: Physician Assistant

## 2023-05-17 VITALS — BP 130/70 | HR 64 | Temp 98.0°F | Resp 16 | Ht 68.0 in | Wt 165.8 lb

## 2023-05-17 DIAGNOSIS — Z09 Encounter for follow-up examination after completed treatment for conditions other than malignant neoplasm: Secondary | ICD-10-CM

## 2023-05-17 DIAGNOSIS — Z8673 Personal history of transient ischemic attack (TIA), and cerebral infarction without residual deficits: Secondary | ICD-10-CM

## 2023-05-17 NOTE — Telephone Encounter (Signed)
Spoke with neeral from [facility] that pt went to the Hospital and they take off  her zio and throw in garbage pt don't know  as per neeral they will put zio at loss and not charging pt

## 2023-05-17 NOTE — Telephone Encounter (Signed)
Gave verbal order to [facility] for speech therapy  [phone] for speech therapy

## 2023-05-17 NOTE — Telephone Encounter (Signed)
Neurology referral sent via [employer] to Dr. Sherryll Burger with [facility].  Notified patient. Gave telephone # [phone]

## 2023-05-17 NOTE — Progress Notes (Signed)
[facility] [address] [place], [place] [address]  Internal MEDICINE  Office Visit Note  Patient Name: Shannon Li  [id number]  [id number]  Date of Service: 05/17/2023     Chief Complaint  Patient presents with   Hospitalization Follow-up    Stroke     HPI Pt is here for recent hospital follow up. Micah Flesher to ED on 05/06/22 for continued dizziness with progression of right side facial numbness and confusion. Admitted on 1/18 and discharged on 1/19. Had been having dizziness and fatigue with congestion for 2 weeks prior to this and found to be bradycardic and orthostatic in office on 05/03/23. Had med adjustment and zio ordered. When unilateral weakness and numbness started with change in gait she went to ED and was found to have small acute/subacute ischemia at left parietoccipital junction on MRI. She was started on ASA, plavix, and crestor with recommendation for OP neurology. -Doing PT at home now, and is helping some, right side more fidgety, but doesn't necessarily feel weaker than left. Still generalized weakness though. Word finding can occasionally be problematic if getting frustrated. Had initially declined speech therapy, but is open to this now.  Current Medication: Outpatient Encounter Medications as of 05/17/2023  Medication Sig Note   amLODipine (NORVASC) 5 MG tablet TAKE 1 TABLET (5 MG TOTAL) BY MOUTH DAILY.    aspirin EC 81 MG tablet Take 1 tablet (81 mg total) by mouth daily. Swallow whole.    clopidogrel (PLAVIX) 75 MG tablet Take 1 tablet (75 mg total) by mouth daily.    doxycycline (VIBRA-TABS) 100 MG tablet Take 1 tablet (100 mg total) by mouth 2 (two) times daily.    DULoxetine (CYMBALTA) 20 MG capsule Take 1 capsule (20 mg total) by mouth 2 (two) times daily.    ezetimibe (ZETIA) 10 MG tablet TAKE 1 TABLET BY MOUTH EVERY DAY    famotidine (PEPCID) 40 MG tablet Take 40 mg by mouth daily.    fluticasone furoate-vilanterol (BREO ELLIPTA) 100-25  MCG/ACT AEPB Inhale 1 puff into the lungs daily.    pantoprazole (PROTONIX) 40 MG tablet TAKE 1 TABLET BY MOUTH DAILY    rosuvastatin (CRESTOR) 10 MG tablet Take 1 tablet (10 mg total) by mouth daily.    triamcinolone cream (KENALOG) 0.1 % Apply 1 Application topically 2 (two) times daily. 05/08/2023: prn   No facility-administered encounter medications on file as of 05/17/2023.    Surgical History: Past Surgical History:  Procedure Laterality Date   ABSCESS DRAINAGE  11/17/2013   buttock right   BREAST BIOPSY Right    neg   CATARACT EXTRACTION W/PHACO Right 08/17/2019   Procedure: CATARACT EXTRACTION PHACO AND INTRAOCULAR LENS PLACEMENT (IOC) RIGHT VISION BLUE 5.32  00:42.7;  Surgeon: Elliot Cousin, MD;  Location: [facility];  Service: Ophthalmology;  Laterality: Right;   CATARACT EXTRACTION W/PHACO Left 09/21/2019   Procedure: CATARACT EXTRACTION PHACO AND INTRAOCULAR LENS PLACEMENT (IOC) LEFT VISION BLUE;  Surgeon: Elliot Cousin, MD;  Location: [facility];  Service: Ophthalmology;  Laterality: Left;  6.24 0:40.6   CHOLECYSTECTOMY  2008   COLONOSCOPY  2014   [facility] in [place]   COLONOSCOPY WITH PROPOFOL N/A 08/26/2015   Procedure: COLONOSCOPY WITH PROPOFOL;  Surgeon: Wallace Cullens, MD;  Location: [facility] END[facility]COPY;  Service: Gastroenterology;  Laterality: N/A;   COLONOSCOPY WITH PROPOFOL N/A 03/03/2021   Procedure: COLONOSCOPY WITH PROPOFOL;  Surgeon: Regis Bill, MD;  Location: [facility] ENDOSCOPY;  Service: Endoscopy;  Laterality: N/A;  DILATION AND CURETTAGE OF UTERUS     ESOPHAGOGASTRODUODENOSCOPY N/A 11/15/2014   Procedure: ESOPHAGOGASTRODUODENOSCOPY (EGD);  Surgeon: Wallace Cullens, MD;  Location: [facility] END[facility];  Service: Gastroenterology;  Laterality: N/A;   ESOPHAGOGASTRODUODENOSCOPY (EGD) WITH PROPOFOL N/A 03/03/2021   Procedure: ESOPHAGOGASTRODUODENOSCOPY (EGD) WITH PROPOFOL;  Surgeon: Regis Bill, MD;  Location: [facility] ENDOSCOPY;  Service:  Endoscopy;  Laterality: N/A;   EYE SURGERY     GASTROSTOMY     UPPER GI ENDOSCOPY  2014   [facility] in [place]    Medical History: Past Medical History:  Diagnosis Date   Anxiety    Arthritis    Atrophic kidney    Chronic constipation    Colon polyp 2013   COPD (chronic obstructive pulmonary disease) (HCC)    Depression    Diverticulosis    Esophageal stricture    Esophagitis    Fibrocystic breast disease    GERD (gastroesophageal reflux disease)    History of hiatal hernia    Hypertension    IBS (irritable bowel syndrome)    Menopause    Ovarian cyst    Perirectal abscess    Plantar fasciitis    Postmenopausal    Renal artery occlusion (HCC)    Renal insufficiency    Right kidney is small and not functioning.    Family History: Family History  Problem Relation Age of Onset   Heart disease Mother    Heart attack Mother    Diabetes Mother    Alzheimer's disease Father    Breast cancer Sister 45   Lung cancer Brother    Brain cancer Brother    Lung cancer Sister    Bladder Cancer Sister    Lung cancer Brother    Leukemia Brother    Diabetes Brother     Social History   Socioeconomic History   Marital status: Widowed    Spouse name: Not on file   Number of children: Not on file   Years of education: Not on file   Highest education level: Not on file  Occupational History   Not on file  Tobacco Use   Smoking status: Former    Current packs/day: 0.50    Average packs/day: 0.5 packs/day for 30.0 years (15.0 ttl pk-yrs)    Types: Cigarettes   Smokeless tobacco: Never   Tobacco comments:    Quit January 2024  Vaping Use   Vaping status: Never Used  Substance and Sexual Activity   Alcohol use: No   Drug use: No   Sexual activity: Not on file  Other Topics Concern   Not on file  Social History Narrative   Not on file   Social Drivers of Health   Financial Resource Strain: Low Risk  (10/15/2020)   Overall Financial Resource Strain  (CARDIA)    Difficulty of Paying Living Expenses: Not very hard  Food Insecurity: No Food Insecurity (05/08/2023)   Hunger Vital Sign    Worried About Running Out of Food in the Last Year: Never true    Ran Out of Food in the Last Year: Never true  Transportation Needs: No Transportation Needs (05/08/2023)   PRAPARE - Administrator, Civil Service (Medical): No    Lack of Transportation (Non-Medical): No  Physical Activity: Not on file  Stress: Not on file  Social Connections: Patient Declined (05/08/2023)   Social Connection and Isolation Panel [NHANES]    Frequency of Communication with Friends and Family: Patient declined    Frequency of  Social Gatherings with Friends and Family: Patient declined    Attends Religious Services: Patient declined    Database administrator or Organizations: Patient declined    Attends Banker Meetings: Patient declined    Marital Status: Patient declined  Intimate Partner Violence: Not At Risk (05/08/2023)   Humiliation, Afraid, Rape, and Kick questionnaire    Fear of Current or Ex-Partner: No    Emotionally Abused: No    Physically Abused: No    Sexually Abused: No      Review of Systems  Constitutional:  Positive for fatigue. Negative for fever.  HENT:  Negative for mouth sores and postnasal drip.   Respiratory:  Negative for cough and shortness of breath.   Cardiovascular:  Negative for chest pain and palpitations.  Gastrointestinal:  Negative for diarrhea and vomiting.  Genitourinary:  Negative for flank pain.  Musculoskeletal:  Positive for gait problem.  Skin:  Negative for wound.  Neurological:  Positive for dizziness, weakness and light-headedness. Negative for headaches.  Psychiatric/Behavioral: Negative.      Vital Signs: BP 130/70   Pulse 64   Temp 98 F (36.7 C)   Resp 16   Ht 5\' 8"  (1.727 m)   Wt 165 lb 12.8 oz (75.2 kg)   SpO2 93%   BMI 25.21 kg/m    Physical Exam Vitals and nursing note  reviewed.  Constitutional:      General: She is not in acute distress.    Appearance: Normal appearance. She is not ill-appearing.  HENT:     Head: Normocephalic and atraumatic.  Eyes:     Pupils: Pupils are equal, round, and reactive to light.  Cardiovascular:     Rate and Rhythm: Normal rate and regular rhythm.  Pulmonary:     Effort: Pulmonary effort is normal. No respiratory distress.  Neurological:     Mental Status: She is alert.     Motor: Weakness present.     Gait: Gait abnormal.     Comments: Walking with walker, right foot partially crossing in front of left  Psychiatric:        Mood and Affect: Mood normal.        Behavior: Behavior normal.       Assessment/Plan: 1. Hospital discharge follow-up (Primary) Reviewed hospital course and med changes  2. History of stroke Continue therapy, will see about adding speech therapy order to PT. Will send referral for OP neurology - Ambulatory referral to Neurology   General Counseling: tiwanna tuch understanding of the findings of todays visit and agrees with plan of treatment. I have discussed any further diagnostic evaluation that may be needed or ordered today. We also reviewed her medications today. she has been encouraged to call the office with any questions or concerns that should arise related to todays visit.    Counseling:    Orders Placed This Encounter  Procedures   Ambulatory referral to Neurology    This patient was seen by Lynn Ito, PA-C in collaboration with Dr. Beverely Risen as a part of collaborative care agreement.   I have reviewed all medical records from hospital follow up including radiology reports and consults from other physicians. Appropriate follow up diagnostics will be scheduled as needed. Patient/ Family understands the plan of treatment. Time spent 35 minutes.   Dr Lyndon Code, MD Internal Medicine

## 2023-05-19 NOTE — Telephone Encounter (Signed)
done

## 2023-05-19 NOTE — Progress Notes (Signed)
Concern for viral illness/covid, influenza

## 2023-05-20 ENCOUNTER — Telehealth: Payer: Self-pay | Admitting: Physician Assistant

## 2023-05-20 NOTE — Telephone Encounter (Signed)
Neurology appointment 08/18/2023 @ [facility]

## 2023-05-24 ENCOUNTER — Telehealth: Payer: Self-pay | Admitting: Physician Assistant

## 2023-05-24 NOTE — Telephone Encounter (Signed)
Received OT evaluation from [facility]. Gave to [employer]

## 2023-05-24 NOTE — Telephone Encounter (Signed)
OT therapy order faxed back to [facility]. Scanned-Toni

## 2023-05-27 ENCOUNTER — Telehealth: Payer: Self-pay

## 2023-05-27 NOTE — Telephone Encounter (Signed)
 Patient's niece, Olam, came by the office to report that patient has been experiencing excessive agitation/aggression since having her stroke. Patient's niece also notified me that she was started on Duloxetine  20mg  in hospital and that she has been having bilateral leg swelling from her knees down. Advised patient's niece that these behavioral changes could be from having a stroke/recovery, per Tinnie. Patient's niece asked if stopping Duloxetine  is ok since she noticed behavioral changes beginning after restarting medication. Per Tinnie, advised patient's niece that this is ok. Also, per Tinnie, advised patient's niece to have the patient elevate her legs to help with edema, sine she has not been as active due to recent health conditions.

## 2023-05-31 ENCOUNTER — Telehealth: Payer: Self-pay | Admitting: Physician Assistant

## 2023-05-31 NOTE — Telephone Encounter (Signed)
 Received 05/11/23 therapy order from [facility]. Gave to [employer]

## 2023-06-01 ENCOUNTER — Telehealth: Payer: Self-pay | Admitting: Physician Assistant

## 2023-06-01 ENCOUNTER — Telehealth: Payer: Self-pay

## 2023-06-01 NOTE — Telephone Encounter (Signed)
05/11/23 therapy order signed. Faxed back to [facility]; [phone]. Scanned-Toni

## 2023-06-02 ENCOUNTER — Telehealth: Payer: Self-pay | Admitting: Physician Assistant

## 2023-06-02 ENCOUNTER — Other Ambulatory Visit: Payer: Self-pay | Admitting: Physician Assistant

## 2023-06-02 DIAGNOSIS — M7989 Other specified soft tissue disorders: Secondary | ICD-10-CM

## 2023-06-02 MED ORDER — HYDROCHLOROTHIAZIDE 12.5 MG PO TABS: ORAL_TABLET | ORAL | 3 refills | Status: DC

## 2023-06-02 NOTE — Telephone Encounter (Signed)
Received 05/21/23 OT cmn from [facility]. Gave to [employer]

## 2023-06-02 NOTE — Telephone Encounter (Signed)
Pt was notified that we add med and also left message to Physical therapy and pt advised to keep BP log

## 2023-06-03 ENCOUNTER — Other Ambulatory Visit: Payer: Self-pay | Admitting: Physician Assistant

## 2023-06-03 ENCOUNTER — Telehealth: Payer: Self-pay | Admitting: Physician Assistant

## 2023-06-03 DIAGNOSIS — K219 Gastro-esophageal reflux disease without esophagitis: Secondary | ICD-10-CM

## 2023-06-03 NOTE — Telephone Encounter (Signed)
05/21/23 OT cmn signed. Faxed back to [facility]; [phone]. Scanned-Toni

## 2023-06-14 ENCOUNTER — Encounter: Payer: Self-pay | Admitting: Physician Assistant

## 2023-06-14 ENCOUNTER — Ambulatory Visit (INDEPENDENT_AMBULATORY_CARE_PROVIDER_SITE_OTHER): Payer: Medicare HMO | Admitting: Physician Assistant

## 2023-06-14 VITALS — BP 126/65 | HR 63 | Temp 98.0°F | Resp 16 | Ht 68.0 in | Wt 162.6 lb

## 2023-06-14 DIAGNOSIS — N1832 Chronic kidney disease, stage 3b: Secondary | ICD-10-CM | POA: Diagnosis not present

## 2023-06-14 DIAGNOSIS — M7989 Other specified soft tissue disorders: Secondary | ICD-10-CM | POA: Diagnosis not present

## 2023-06-14 DIAGNOSIS — Z8673 Personal history of transient ischemic attack (TIA), and cerebral infarction without residual deficits: Secondary | ICD-10-CM | POA: Diagnosis not present

## 2023-06-14 NOTE — Progress Notes (Signed)
 [facility] [address] [place], [place] [address]  Internal MEDICINE  Office Visit Note  Patient Name: Shannon Li  [id number]  [id number]  Date of Service: 06/23/2023  Chief Complaint  Patient presents with   Acute Visit   Edema    In right ankle, worried about blood clot     HPI Pt is here for a sick visit. -Still doing home PT/OT for 2 more visits, doing better, not using walker now -had swollen area and pain along inside of right ankle and had to walk tip toe, but this went away. No skin changes or color changes and pain resolved. Advised to call if anything changes as [place] may be considered -doing well with 1/2 tab hydrochlorothiazide which was called in when pt initially called about swelling. Seems to be working well. -lesion on right hand 2 weeks ago, does have dry skin, but now has rough patch that is tender along on hand. Is going to call dermatologist -neurologist at end of April, still feeling fatigued since stroke  Current Medication:  Outpatient Encounter Medications as of 06/14/2023  Medication Sig Note   amLODipine (NORVASC) 5 MG tablet TAKE 1 TABLET (5 MG TOTAL) BY MOUTH DAILY.    aspirin EC 81 MG tablet Take 1 tablet (81 mg total) by mouth daily. Swallow whole.    clopidogrel (PLAVIX) 75 MG tablet Take 1 tablet (75 mg total) by mouth daily.    doxycycline (VIBRA-TABS) 100 MG tablet Take 1 tablet (100 mg total) by mouth 2 (two) times daily.    DULoxetine (CYMBALTA) 20 MG capsule Take 1 capsule (20 mg total) by mouth 2 (two) times daily.    famotidine (PEPCID) 40 MG tablet Take 40 mg by mouth daily.    fluticasone furoate-vilanterol (BREO ELLIPTA) 100-25 MCG/ACT AEPB Inhale 1 puff into the lungs daily.    hydrochlorothiazide (HYDRODIURIL) 12.5 MG tablet Take 1/2 tablet by mouth once daily    pantoprazole (PROTONIX) 40 MG tablet TAKE 1 TABLET BY MOUTH TWICE A DAY    rosuvastatin (CRESTOR) 10 MG tablet Take 1 tablet (10 mg total) by mouth daily.     triamcinolone cream (KENALOG) 0.1 % Apply 1 Application topically 2 (two) times daily. 05/08/2023: prn   [DISCONTINUED] ezetimibe (ZETIA) 10 MG tablet TAKE 1 TABLET BY MOUTH EVERY DAY    No facility-administered encounter medications on file as of 06/14/2023.      Medical History: Past Medical History:  Diagnosis Date   Anxiety    Arthritis    Atrophic kidney    Chronic constipation    Colon polyp 2013   COPD (chronic obstructive pulmonary disease) (HCC)    Depression    Diverticulosis    Esophageal stricture    Esophagitis    Fibrocystic breast disease    GERD (gastroesophageal reflux disease)    History of hiatal hernia    Hypertension    IBS (irritable bowel syndrome)    Menopause    Ovarian cyst    Perirectal abscess    Plantar fasciitis    Postmenopausal    Renal artery occlusion (HCC)    Renal insufficiency    Right kidney is small and not functioning.     Vital Signs: BP 126/65   Pulse 63   Temp 98 F (36.7 C)   Resp 16   Ht 5\' 8"  (1.727 m)   Wt 162 lb 9.6 oz (73.8 kg)   SpO2 97%   BMI 24.72 kg/m    Review  of Systems  Constitutional:  Positive for fatigue. Negative for fever.  HENT:  Negative for mouth sores and postnasal drip.   Respiratory:  Negative for cough and shortness of breath.   Cardiovascular:  Negative for chest pain, palpitations and leg swelling.  Gastrointestinal:  Negative for diarrhea and vomiting.  Genitourinary:  Negative for flank pain.  Skin:  Positive for rash.       Tender lesion on hand  Neurological:  Positive for weakness and light-headedness. Negative for headaches.  Psychiatric/Behavioral: Negative.      Physical Exam Vitals and nursing note reviewed.  Constitutional:      General: She is not in acute distress.    Appearance: Normal appearance. She is not ill-appearing.  HENT:     Head: Normocephalic and atraumatic.  Cardiovascular:     Rate and Rhythm: Normal rate and regular rhythm.  Pulmonary:     Effort:  Pulmonary effort is normal. No respiratory distress.     Breath sounds: Normal breath sounds.  Musculoskeletal:     Right lower leg: No edema.     Left lower leg: No edema.  Skin:    Findings: Lesion present.     Comments: Tender rough lesion along right hand, with some dry skin  Neurological:     Mental Status: She is alert.  Psychiatric:        Mood and Affect: Mood normal.        Behavior: Behavior normal.       Assessment/Plan: 1. Localized swelling of both lower extremities (Primary) Improved with use of 1/2 tab hydrochlorothiazide, none noted on exam today and pt endorses improvement as well. Will continue to monitor BP to ensure not dropping  2. Stage 3b chronic kidney disease (HCC) Will check labs for monitoring with restarting hydrochlorothiazide, does have chronic CKD and previously saw nephrology, but has not been seen in awhile - Basic Metabolic Panel (BMET)  3. History of stroke Continuing with therapy and is walking without walker in office today. Discussed fatigue may be due to recent stroke. Seeing neurology in April   General Counseling: Consuelo Pandy understanding of the findings of todays visit and agrees with plan of treatment. I have discussed any further diagnostic evaluation that may be needed or ordered today. We also reviewed her medications today. she has been encouraged to call the office with any questions or concerns that should arise related to todays visit.    Counseling:    Orders Placed This Encounter  Procedures   Basic Metabolic Panel (BMET)    No orders of the defined types were placed in this encounter.   Time spent:30 Minutes

## 2023-06-16 ENCOUNTER — Other Ambulatory Visit: Payer: Self-pay | Admitting: Physician Assistant

## 2023-06-16 DIAGNOSIS — E782 Mixed hyperlipidemia: Secondary | ICD-10-CM

## 2023-06-18 DIAGNOSIS — N1832 Chronic kidney disease, stage 3b: Secondary | ICD-10-CM | POA: Diagnosis not present

## 2023-06-19 LAB — BASIC METABOLIC PANEL WITH GFR
BUN/Creatinine Ratio: 10 — ABNORMAL LOW (ref 12–28)
BUN: 25 mg/dL (ref 8–27)
CO2: 25 mmol/L (ref 20–29)
Calcium: 9.9 mg/dL (ref 8.7–10.3)
Chloride: 101 mmol/L (ref 96–106)
Creatinine, Ser: 2.4 mg/dL — ABNORMAL HIGH (ref 0.57–1.00)
Glucose: 141 mg/dL — ABNORMAL HIGH (ref 70–99)
Potassium: 4.6 mmol/L (ref 3.5–5.2)
Sodium: 143 mmol/L (ref 134–144)
eGFR: 21 mL/min/{1.73_m2} — ABNORMAL LOW

## 2023-06-22 ENCOUNTER — Telehealth: Payer: Self-pay

## 2023-06-23 NOTE — Telephone Encounter (Signed)
 Pt advised for labs result and also pt had appt with nephrology  tomorrow

## 2023-06-24 DIAGNOSIS — I639 Cerebral infarction, unspecified: Secondary | ICD-10-CM | POA: Diagnosis not present

## 2023-06-24 DIAGNOSIS — N1832 Chronic kidney disease, stage 3b: Secondary | ICD-10-CM | POA: Diagnosis not present

## 2023-06-24 DIAGNOSIS — F419 Anxiety disorder, unspecified: Secondary | ICD-10-CM | POA: Diagnosis not present

## 2023-06-24 DIAGNOSIS — N270 Small kidney, unilateral: Secondary | ICD-10-CM | POA: Diagnosis not present

## 2023-06-24 DIAGNOSIS — N261 Atrophy of kidney (terminal): Secondary | ICD-10-CM | POA: Diagnosis not present

## 2023-06-24 DIAGNOSIS — E7800 Pure hypercholesterolemia, unspecified: Secondary | ICD-10-CM | POA: Diagnosis not present

## 2023-06-24 DIAGNOSIS — I10 Essential (primary) hypertension: Secondary | ICD-10-CM | POA: Diagnosis not present

## 2023-06-24 DIAGNOSIS — J449 Chronic obstructive pulmonary disease, unspecified: Secondary | ICD-10-CM | POA: Diagnosis not present

## 2023-06-24 DIAGNOSIS — K219 Gastro-esophageal reflux disease without esophagitis: Secondary | ICD-10-CM | POA: Diagnosis not present

## 2023-06-25 ENCOUNTER — Other Ambulatory Visit: Payer: Self-pay | Admitting: Nephrology

## 2023-06-25 DIAGNOSIS — N261 Atrophy of kidney (terminal): Secondary | ICD-10-CM

## 2023-06-25 DIAGNOSIS — N270 Small kidney, unilateral: Secondary | ICD-10-CM

## 2023-06-25 DIAGNOSIS — N1832 Chronic kidney disease, stage 3b: Secondary | ICD-10-CM

## 2023-06-29 DIAGNOSIS — R208 Other disturbances of skin sensation: Secondary | ICD-10-CM | POA: Diagnosis not present

## 2023-06-29 DIAGNOSIS — D485 Neoplasm of uncertain behavior of skin: Secondary | ICD-10-CM | POA: Diagnosis not present

## 2023-06-29 DIAGNOSIS — L570 Actinic keratosis: Secondary | ICD-10-CM | POA: Diagnosis not present

## 2023-06-29 DIAGNOSIS — C44622 Squamous cell carcinoma of skin of right upper limb, including shoulder: Secondary | ICD-10-CM | POA: Diagnosis not present

## 2023-07-01 ENCOUNTER — Telehealth: Payer: Self-pay

## 2023-07-01 NOTE — Telephone Encounter (Signed)
 Pt called that she just like to known that she should continue Asprin and Plavix  and as per lauren advised continue both med and follow up with neurology

## 2023-07-02 ENCOUNTER — Ambulatory Visit
Admission: RE | Admit: 2023-07-02 | Discharge: 2023-07-02 | Disposition: A | Discharge status: Home / Self Care | Source: Ambulatory Visit | Attending: Nephrology | Admitting: Nephrology

## 2023-07-02 DIAGNOSIS — N183 Chronic kidney disease, stage 3 unspecified: Secondary | ICD-10-CM | POA: Diagnosis not present

## 2023-07-02 DIAGNOSIS — N1832 Chronic kidney disease, stage 3b: Secondary | ICD-10-CM

## 2023-07-02 DIAGNOSIS — N261 Atrophy of kidney (terminal): Secondary | ICD-10-CM | POA: Diagnosis present

## 2023-07-02 DIAGNOSIS — N270 Small kidney, unilateral: Secondary | ICD-10-CM | POA: Diagnosis present

## 2023-07-02 DIAGNOSIS — N2889 Other specified disorders of kidney and ureter: Secondary | ICD-10-CM | POA: Diagnosis not present

## 2023-07-09 ENCOUNTER — Telehealth: Payer: Self-pay

## 2023-07-09 NOTE — Telephone Encounter (Signed)
 Pt called that she is having dizziness advised her keep track on bp and made her appt with lauren and also advised pt if symptoms worse go to ED

## 2023-07-12 ENCOUNTER — Ambulatory Visit: Admitting: Physician Assistant

## 2023-07-15 ENCOUNTER — Other Ambulatory Visit: Payer: Self-pay | Admitting: Nephrology

## 2023-07-15 DIAGNOSIS — N1832 Chronic kidney disease, stage 3b: Secondary | ICD-10-CM

## 2023-07-19 ENCOUNTER — Other Ambulatory Visit: Payer: Self-pay | Admitting: Nephrology

## 2023-07-19 DIAGNOSIS — N261 Atrophy of kidney (terminal): Secondary | ICD-10-CM

## 2023-07-19 DIAGNOSIS — N1832 Chronic kidney disease, stage 3b: Secondary | ICD-10-CM

## 2023-07-19 DIAGNOSIS — N2889 Other specified disorders of kidney and ureter: Secondary | ICD-10-CM

## 2023-07-19 DIAGNOSIS — N270 Small kidney, unilateral: Secondary | ICD-10-CM

## 2023-07-21 DIAGNOSIS — N1832 Chronic kidney disease, stage 3b: Secondary | ICD-10-CM | POA: Diagnosis not present

## 2023-07-21 DIAGNOSIS — K219 Gastro-esophageal reflux disease without esophagitis: Secondary | ICD-10-CM | POA: Diagnosis not present

## 2023-07-21 DIAGNOSIS — F419 Anxiety disorder, unspecified: Secondary | ICD-10-CM | POA: Diagnosis not present

## 2023-07-21 DIAGNOSIS — I10 Essential (primary) hypertension: Secondary | ICD-10-CM | POA: Diagnosis not present

## 2023-07-21 DIAGNOSIS — E7800 Pure hypercholesterolemia, unspecified: Secondary | ICD-10-CM | POA: Diagnosis not present

## 2023-07-21 DIAGNOSIS — J449 Chronic obstructive pulmonary disease, unspecified: Secondary | ICD-10-CM | POA: Diagnosis not present

## 2023-07-21 DIAGNOSIS — N270 Small kidney, unilateral: Secondary | ICD-10-CM | POA: Diagnosis not present

## 2023-07-26 ENCOUNTER — Ambulatory Visit

## 2023-07-30 ENCOUNTER — Ambulatory Visit
Admission: RE | Admit: 2023-07-30 | Discharge: 2023-07-30 | Disposition: A | Discharge status: Home / Self Care | Source: Ambulatory Visit | Attending: Nephrology | Admitting: Nephrology

## 2023-07-30 DIAGNOSIS — N1832 Chronic kidney disease, stage 3b: Secondary | ICD-10-CM | POA: Insufficient documentation

## 2023-07-30 DIAGNOSIS — N261 Atrophy of kidney (terminal): Secondary | ICD-10-CM | POA: Insufficient documentation

## 2023-07-30 DIAGNOSIS — K5730 Diverticulosis of large intestine without perforation or abscess without bleeding: Secondary | ICD-10-CM | POA: Diagnosis not present

## 2023-07-30 DIAGNOSIS — N270 Small kidney, unilateral: Secondary | ICD-10-CM | POA: Insufficient documentation

## 2023-07-30 DIAGNOSIS — N2889 Other specified disorders of kidney and ureter: Secondary | ICD-10-CM | POA: Insufficient documentation

## 2023-07-30 DIAGNOSIS — N858 Other specified noninflammatory disorders of uterus: Secondary | ICD-10-CM | POA: Diagnosis not present

## 2023-07-30 DIAGNOSIS — N83201 Unspecified ovarian cyst, right side: Secondary | ICD-10-CM | POA: Diagnosis not present

## 2023-07-31 ENCOUNTER — Other Ambulatory Visit: Payer: Self-pay | Admitting: Physician Assistant

## 2023-07-31 DIAGNOSIS — K219 Gastro-esophageal reflux disease without esophagitis: Secondary | ICD-10-CM

## 2023-08-03 DIAGNOSIS — D2271 Melanocytic nevi of right lower limb, including hip: Secondary | ICD-10-CM | POA: Diagnosis not present

## 2023-08-03 DIAGNOSIS — D2261 Melanocytic nevi of right upper limb, including shoulder: Secondary | ICD-10-CM | POA: Diagnosis not present

## 2023-08-03 DIAGNOSIS — D2272 Melanocytic nevi of left lower limb, including hip: Secondary | ICD-10-CM | POA: Diagnosis not present

## 2023-08-03 DIAGNOSIS — D485 Neoplasm of uncertain behavior of skin: Secondary | ICD-10-CM | POA: Diagnosis not present

## 2023-08-03 DIAGNOSIS — L570 Actinic keratosis: Secondary | ICD-10-CM | POA: Diagnosis not present

## 2023-08-03 DIAGNOSIS — D2262 Melanocytic nevi of left upper limb, including shoulder: Secondary | ICD-10-CM | POA: Diagnosis not present

## 2023-08-03 DIAGNOSIS — D225 Melanocytic nevi of trunk: Secondary | ICD-10-CM | POA: Diagnosis not present

## 2023-08-03 DIAGNOSIS — L821 Other seborrheic keratosis: Secondary | ICD-10-CM | POA: Diagnosis not present

## 2023-08-03 DIAGNOSIS — D0461 Carcinoma in situ of skin of right upper limb, including shoulder: Secondary | ICD-10-CM | POA: Diagnosis not present

## 2023-08-03 DIAGNOSIS — B079 Viral wart, unspecified: Secondary | ICD-10-CM | POA: Diagnosis not present

## 2023-08-03 DIAGNOSIS — Z85828 Personal history of other malignant neoplasm of skin: Secondary | ICD-10-CM | POA: Diagnosis not present

## 2023-08-03 DIAGNOSIS — Z08 Encounter for follow-up examination after completed treatment for malignant neoplasm: Secondary | ICD-10-CM | POA: Diagnosis not present

## 2023-08-04 DIAGNOSIS — E7800 Pure hypercholesterolemia, unspecified: Secondary | ICD-10-CM | POA: Diagnosis not present

## 2023-08-04 DIAGNOSIS — N1832 Chronic kidney disease, stage 3b: Secondary | ICD-10-CM | POA: Diagnosis not present

## 2023-08-04 DIAGNOSIS — J449 Chronic obstructive pulmonary disease, unspecified: Secondary | ICD-10-CM | POA: Diagnosis not present

## 2023-08-04 DIAGNOSIS — I10 Essential (primary) hypertension: Secondary | ICD-10-CM | POA: Diagnosis not present

## 2023-08-04 DIAGNOSIS — N2889 Other specified disorders of kidney and ureter: Secondary | ICD-10-CM | POA: Diagnosis not present

## 2023-08-04 DIAGNOSIS — K219 Gastro-esophageal reflux disease without esophagitis: Secondary | ICD-10-CM | POA: Diagnosis not present

## 2023-08-04 DIAGNOSIS — I639 Cerebral infarction, unspecified: Secondary | ICD-10-CM | POA: Diagnosis not present

## 2023-08-04 DIAGNOSIS — F419 Anxiety disorder, unspecified: Secondary | ICD-10-CM | POA: Diagnosis not present

## 2023-08-04 DIAGNOSIS — N261 Atrophy of kidney (terminal): Secondary | ICD-10-CM | POA: Diagnosis not present

## 2023-08-16 ENCOUNTER — Encounter: Payer: Self-pay | Admitting: Physician Assistant

## 2023-08-16 ENCOUNTER — Ambulatory Visit (INDEPENDENT_AMBULATORY_CARE_PROVIDER_SITE_OTHER): Payer: Medicare HMO | Admitting: Physician Assistant

## 2023-08-16 ENCOUNTER — Telehealth: Payer: Self-pay | Admitting: Physician Assistant

## 2023-08-16 VITALS — BP 132/68 | HR 73 | Temp 97.8°F | Resp 16 | Ht 68.0 in | Wt 159.0 lb

## 2023-08-16 DIAGNOSIS — I10 Essential (primary) hypertension: Secondary | ICD-10-CM | POA: Diagnosis not present

## 2023-08-16 DIAGNOSIS — N1832 Chronic kidney disease, stage 3b: Secondary | ICD-10-CM | POA: Diagnosis not present

## 2023-08-16 DIAGNOSIS — Z8673 Personal history of transient ischemic attack (TIA), and cerebral infarction without residual deficits: Secondary | ICD-10-CM

## 2023-08-16 DIAGNOSIS — N83201 Unspecified ovarian cyst, right side: Secondary | ICD-10-CM | POA: Diagnosis not present

## 2023-08-16 NOTE — Progress Notes (Signed)
 [facility] [address] [place], [place] [address]  Internal MEDICINE  Office Visit Note  Patient Name: Shannon Li  [id number]  [id number]  Date of Service: 08/28/2023  Chief Complaint  Patient presents with   Follow-up    Bp    HPI Pt is here for routine follow up -BP at home fluctuates, may be as high as 140s/70s, but overall doing well -taking 1/2 tab hydrochlorothiazide  and is taking amlodipine  -Has completed therapy. Doing better with strength now.  -will get agitated since stroke, cymbalta  not helping, but also doesn't really want to change anything. Reports she has tried lexapro  before and didn't make a difference. Discussed other options like zoloft. -neurology on Wednesday and is hoping to be able to stop plavix  and be cleared to drive which she thinks will help agitation. May discuss residual behavioral changes since stroke with them as well -states she had a renal US  with nephrology that showed possible mass, so a CT was done. CT did not show anything in area of concern for kidney, but did show ovarian cyst on right which recommended US  follow up. Pt was offered GYN by nephrology, but she decided to wait to discuss today. Offered to order US  first and go from there and pt is interested in this. States she is unsure what they are doing about her kidney now, as she can't do imaging with contrast for further eval due to low kidney function.  Current Medication: Outpatient Encounter Medications as of 08/16/2023  Medication Sig Note   amLODipine  (NORVASC ) 5 MG tablet TAKE 1 TABLET (5 MG TOTAL) BY MOUTH DAILY.    aspirin  EC 81 MG tablet Take 1 tablet (81 mg total) by mouth daily. Swallow whole.    DULoxetine  (CYMBALTA ) 20 MG capsule Take 1 capsule (20 mg total) by mouth 2 (two) times daily.    ezetimibe  (ZETIA ) 10 MG tablet TAKE 1 TABLET BY MOUTH EVERY DAY    famotidine (PEPCID) 40 MG tablet Take 40 mg by mouth daily.    fluticasone  furoate-vilanterol (BREO  ELLIPTA) 100-25 MCG/ACT AEPB Inhale 1 puff into the lungs daily.    hydrochlorothiazide  (HYDRODIURIL ) 12.5 MG tablet Take 1/2 tablet by mouth once daily    pantoprazole  (PROTONIX ) 40 MG tablet TAKE 1 TABLET BY MOUTH EVERY DAY    rosuvastatin  (CRESTOR ) 10 MG tablet Take 1 tablet (10 mg total) by mouth daily.    triamcinolone  cream (KENALOG ) 0.1 % Apply 1 Application topically 2 (two) times daily. 05/08/2023: prn   No facility-administered encounter medications on file as of 08/16/2023.    Surgical History: Past Surgical History:  Procedure Laterality Date   ABSCESS DRAINAGE  11/17/2013   buttock right   BREAST BIOPSY Right    neg   CATARACT EXTRACTION W/PHACO Right 08/17/2019   Procedure: CATARACT EXTRACTION PHACO AND INTRAOCULAR LENS PLACEMENT (IOC) RIGHT VISION BLUE 5.32  00:42.7;  Surgeon: Ola Berger, MD;  Location: [facility];  Service: Ophthalmology;  Laterality: Right;   CATARACT EXTRACTION W/PHACO Left 09/21/2019   Procedure: CATARACT EXTRACTION PHACO AND INTRAOCULAR LENS PLACEMENT (IOC) LEFT VISION BLUE;  Surgeon: Ola Berger, MD;  Location: [facility];  Service: Ophthalmology;  Laterality: Left;  6.24 0:40.6   CHOLECYSTECTOMY  2008   COLONOSCOPY  2014   [facility] in [place]   COLONOSCOPY WITH PROPOFOL  N/A 08/26/2015   Procedure: COLONOSCOPY WITH PROPOFOL ;  Surgeon: Stephens Eis, MD;  Location: [facility] END[facility]COPY;  Service: Gastroenterology;  Laterality: N/A;   COLONOSCOPY  WITH PROPOFOL  N/A 03/03/2021   Procedure: COLONOSCOPY WITH PROPOFOL ;  Surgeon: Shane Darling, MD;  Location: [facility] ENDOSCOPY;  Service: Endoscopy;  Laterality: N/A;   DILATION AND CURETTAGE OF UTERUS     ESOPHAGOGASTRODUODENOSCOPY N/A 11/15/2014   Procedure: ESOPHAGOGASTRODUODENOSCOPY (EGD);  Surgeon: Stephens Eis, MD;  Location: [facility] ENDOSCOPY;  Service: Gastroenterology;  Laterality: N/A;   ESOPHAGOGASTRODUODENOSCOPY (EGD) WITH PROPOFOL  N/A 03/03/2021   Procedure:  ESOPHAGOGASTRODUODENOSCOPY (EGD) WITH PROPOFOL ;  Surgeon: Shane Darling, MD;  Location: [facility] ENDOSCOPY;  Service: Endoscopy;  Laterality: N/A;   EYE SURGERY     GASTROSTOMY     UPPER GI ENDOSCOPY  2014   [facility] in [place]    Medical History: Past Medical History:  Diagnosis Date   Anxiety    Arthritis    Atrophic kidney    Chronic constipation    Colon polyp 2013   COPD (chronic obstructive pulmonary disease) (HCC)    Depression    Diverticulosis    Esophageal stricture    Esophagitis    Fibrocystic breast disease    GERD (gastroesophageal reflux disease)    History of hiatal hernia    Hypertension    IBS (irritable bowel syndrome)    Menopause    Ovarian cyst    Perirectal abscess    Plantar fasciitis    Postmenopausal    Renal artery occlusion (HCC)    Renal insufficiency    Right kidney is small and not functioning.    Family History: Family History  Problem Relation Age of Onset   Heart disease Mother    Heart attack Mother    Diabetes Mother    Alzheimer's disease Father    Breast cancer Sister 46   Lung cancer Brother    Brain cancer Brother    Lung cancer Sister    Bladder Cancer Sister    Lung cancer Brother    Leukemia Brother    Diabetes Brother     Social History   Socioeconomic History   Marital status: Widowed    Spouse name: Not on file   Number of children: Not on file   Years of education: Not on file   Highest education level: Not on file  Occupational History   Not on file  Tobacco Use   Smoking status: Former    Current packs/day: 0.50    Average packs/day: 0.5 packs/day for 30.0 years (15.0 ttl pk-yrs)    Types: Cigarettes   Smokeless tobacco: Never   Tobacco comments:    Quit January 2024  Vaping Use   Vaping status: Never Used  Substance and Sexual Activity   Alcohol use: No   Drug use: No   Sexual activity: Not on file  Other Topics Concern   Not on file  Social History Narrative   Not on file    Social Drivers of Health   Financial Resource Strain: Low Risk  (08/18/2023)   Received from [facility]   Overall Financial Resource Strain (CARDIA)    Difficulty of Paying Living Expenses: Not very hard  Food Insecurity: No Food Insecurity (08/18/2023)   Received from [facility]   Hunger Vital Sign    Worried About Running Out of Food in the Last Year: Never true    Ran Out of Food in the Last Year: Never true  Transportation Needs: No Transportation Needs (08/18/2023)   Received from [facility] - Transportation    In the past  12 months, has lack of transportation kept you from medical appointments or from getting medications?: No    Lack of Transportation (Non-Medical): No  Physical Activity: Not on file  Stress: Not on file  Social Connections: Patient Declined (05/08/2023)   Social Connection and Isolation Panel [NHANES]    Frequency of Communication with Friends and Family: Patient declined    Frequency of Social Gatherings with Friends and Family: Patient declined    Attends Religious Services: Patient declined    Database administrator or Organizations: Patient declined    Attends Banker Meetings: Patient declined    Marital Status: Patient declined  Intimate Partner Violence: Not At Risk (05/08/2023)   Humiliation, Afraid, Rape, and Kick questionnaire    Fear of Current or Ex-Partner: No    Emotionally Abused: No    Physically Abused: No    Sexually Abused: No      Review of Systems  Constitutional:  Negative for chills, fatigue and unexpected weight change.  HENT:  Positive for postnasal drip. Negative for congestion, rhinorrhea, sneezing and sore throat.   Eyes:  Negative for redness.  Respiratory:  Negative for cough, chest tightness and shortness of breath.   Cardiovascular:  Negative for chest pain and palpitations.  Gastrointestinal:  Negative for abdominal pain, constipation,  diarrhea and vomiting.  Genitourinary:  Negative for dysuria and frequency.  Musculoskeletal:  Negative for arthralgias, back pain, joint swelling and neck pain.  Skin:  Negative for rash.  Neurological: Negative.  Negative for tremors.  Hematological:  Negative for adenopathy. Does not bruise/bleed easily.  Psychiatric/Behavioral:  Positive for agitation. Negative for behavioral problems (Depression), sleep disturbance and suicidal ideas. The patient is not nervous/anxious.     Vital Signs: BP 132/68 Comment: 150/70  Pulse 73   Temp 97.8 F (36.6 C)   Resp 16   Ht 5\' 8"  (1.727 m)   Wt 159 lb (72.1 kg)   SpO2 94%   BMI 24.18 kg/m    Physical Exam Vitals and nursing note reviewed.  Constitutional:      Appearance: Normal appearance.  HENT:     Head: Normocephalic and atraumatic.  Eyes:     Extraocular Movements: Extraocular movements intact.  Cardiovascular:     Rate and Rhythm: Normal rate and regular rhythm.  Pulmonary:     Effort: Pulmonary effort is normal.     Breath sounds: Normal breath sounds.  Skin:    General: Skin is warm and dry.  Neurological:     Mental Status: She is alert.     Gait: Gait normal.  Psychiatric:        Behavior: Behavior normal.        Assessment/Plan: 1. Essential hypertension (Primary) Stable, continue current medications  2. Cyst of right ovary - US  Pelvic Complete With Transvaginal; Future  3. Stage 3b chronic kidney disease (HCC) Followed by nephrology  4. History of stroke Followed by neurology   General Counseling: Maxwell Spark understanding of the findings of todays visit and agrees with plan of treatment. I have discussed any further diagnostic evaluation that may be needed or ordered today. We also reviewed her medications today. she has been encouraged to call the office with any questions or concerns that should arise related to todays visit.    Orders Placed This Encounter  Procedures   US  Pelvic  Complete With Transvaginal    No orders of the defined types were placed in this encounter.   This patient was seen  by Taylor Favia, PA-C in collaboration with Dr. Verneta Gone as a part of collaborative care agreement.   Total time spent:30 Minutes Time spent includes review of chart, medications, test results, and follow up plan with the patient.      Dr Fozia M Khan Internal medicine

## 2023-08-16 NOTE — Telephone Encounter (Signed)
 Lvm to return my call for u/s appointment & to schedule follow up-Toni

## 2023-08-18 DIAGNOSIS — R451 Restlessness and agitation: Secondary | ICD-10-CM | POA: Diagnosis not present

## 2023-08-18 DIAGNOSIS — G939 Disorder of brain, unspecified: Secondary | ICD-10-CM | POA: Diagnosis not present

## 2023-08-18 DIAGNOSIS — Z8673 Personal history of transient ischemic attack (TIA), and cerebral infarction without residual deficits: Secondary | ICD-10-CM | POA: Diagnosis not present

## 2023-08-18 DIAGNOSIS — N189 Chronic kidney disease, unspecified: Secondary | ICD-10-CM | POA: Diagnosis not present

## 2023-08-18 DIAGNOSIS — R0683 Snoring: Secondary | ICD-10-CM | POA: Diagnosis not present

## 2023-08-19 ENCOUNTER — Ambulatory Visit
Admission: RE | Admit: 2023-08-19 | Discharge: 2023-08-19 | Disposition: A | Discharge status: Home / Self Care | Source: Ambulatory Visit | Attending: Physician Assistant | Admitting: Physician Assistant

## 2023-08-19 DIAGNOSIS — Z8673 Personal history of transient ischemic attack (TIA), and cerebral infarction without residual deficits: Secondary | ICD-10-CM | POA: Diagnosis not present

## 2023-08-19 DIAGNOSIS — C44622 Squamous cell carcinoma of skin of right upper limb, including shoulder: Secondary | ICD-10-CM | POA: Diagnosis not present

## 2023-08-19 DIAGNOSIS — N83201 Unspecified ovarian cyst, right side: Secondary | ICD-10-CM | POA: Diagnosis present

## 2023-08-19 DIAGNOSIS — H26492 Other secondary cataract, left eye: Secondary | ICD-10-CM | POA: Diagnosis not present

## 2023-08-19 DIAGNOSIS — H43812 Vitreous degeneration, left eye: Secondary | ICD-10-CM | POA: Diagnosis not present

## 2023-08-19 DIAGNOSIS — Z9889 Other specified postprocedural states: Secondary | ICD-10-CM | POA: Diagnosis not present

## 2023-08-19 DIAGNOSIS — Z961 Presence of intraocular lens: Secondary | ICD-10-CM | POA: Diagnosis not present

## 2023-09-02 DIAGNOSIS — D0461 Carcinoma in situ of skin of right upper limb, including shoulder: Secondary | ICD-10-CM | POA: Diagnosis not present

## 2023-09-09 ENCOUNTER — Encounter: Payer: Self-pay | Admitting: Physician Assistant

## 2023-09-09 ENCOUNTER — Ambulatory Visit (INDEPENDENT_AMBULATORY_CARE_PROVIDER_SITE_OTHER): Admitting: Physician Assistant

## 2023-09-09 VITALS — BP 144/80 | HR 88 | Temp 98.0°F | Resp 16 | Ht 68.0 in | Wt 158.4 lb

## 2023-09-09 DIAGNOSIS — R7303 Prediabetes: Secondary | ICD-10-CM | POA: Diagnosis not present

## 2023-09-09 DIAGNOSIS — E538 Deficiency of other specified B group vitamins: Secondary | ICD-10-CM | POA: Diagnosis not present

## 2023-09-09 DIAGNOSIS — N1832 Chronic kidney disease, stage 3b: Secondary | ICD-10-CM

## 2023-09-09 DIAGNOSIS — Z8673 Personal history of transient ischemic attack (TIA), and cerebral infarction without residual deficits: Secondary | ICD-10-CM | POA: Diagnosis not present

## 2023-09-09 DIAGNOSIS — E559 Vitamin D deficiency, unspecified: Secondary | ICD-10-CM | POA: Diagnosis not present

## 2023-09-09 DIAGNOSIS — R5383 Other fatigue: Secondary | ICD-10-CM

## 2023-09-09 DIAGNOSIS — R7989 Other specified abnormal findings of blood chemistry: Secondary | ICD-10-CM | POA: Diagnosis not present

## 2023-09-09 DIAGNOSIS — N859 Noninflammatory disorder of uterus, unspecified: Secondary | ICD-10-CM

## 2023-09-09 DIAGNOSIS — N83201 Unspecified ovarian cyst, right side: Secondary | ICD-10-CM | POA: Diagnosis not present

## 2023-09-09 DIAGNOSIS — I10 Essential (primary) hypertension: Secondary | ICD-10-CM

## 2023-09-09 DIAGNOSIS — E782 Mixed hyperlipidemia: Secondary | ICD-10-CM

## 2023-09-09 NOTE — Progress Notes (Signed)
 [facility] [address] [place], [place] [address]  Internal MEDICINE  Office Visit Note  Patient Name: Shannon Li  [id number]  [id number]  Date of Service: 09/09/2023  Chief Complaint  Patient presents with   Follow-up    Review pelvic U/S   Depression   Gastroesophageal Reflux   Hypertension    HPI Pt is here for routine follow up -still getting fatigued -BP has been controlled at home, borderline in office. -Saw neurology. Stopped plavix  and continued ASA. Wanted sleep study--she was initially not interested but open to home study-- hasn't happened though. Told she needed eye exam before cleared to drive and got in the next day and eyes exam went well -Did have skin cancer removed from left hand -Pelvic US  08/19/23 reviewed: IMPRESSION:  1. 2.8 cm simple appearing right ovarian cyst. This corresponds to  the low-attenuation mass noted on CT. No followup imaging  recommended. Note: This recommendation does not apply to  premenarchal patients or to those with increased risk (genetic,  family history, elevated tumor markers or other high-risk factors)  of ovarian cancer. Reference: Radiology 2019 Nov; 293(2):359-371.  2. Small amount of endometrial canal fluid, nonspecific. Thin  endometrium.  3. Left ovary not visualized.   Current Medication: Outpatient Encounter Medications as of 09/09/2023  Medication Sig   amLODipine  (NORVASC ) 5 MG tablet TAKE 1 TABLET (5 MG TOTAL) BY MOUTH DAILY.   aspirin  EC 81 MG tablet Take 1 tablet (81 mg total) by mouth daily. Swallow whole.   DULoxetine  (CYMBALTA ) 20 MG capsule Take 1 capsule (20 mg total) by mouth 2 (two) times daily.   ezetimibe  (ZETIA ) 10 MG tablet TAKE 1 TABLET BY MOUTH EVERY DAY   famotidine (PEPCID) 40 MG tablet Take 40 mg by mouth daily.   hydrochlorothiazide  (HYDRODIURIL ) 12.5 MG tablet Take 1/2 tablet by mouth once daily   pantoprazole  (PROTONIX ) 40 MG tablet TAKE 1 TABLET BY MOUTH EVERY DAY    rosuvastatin  (CRESTOR ) 10 MG tablet Take 1 tablet (10 mg total) by mouth daily.   [DISCONTINUED] fluticasone  furoate-vilanterol (BREO ELLIPTA ) 100-25 MCG/ACT AEPB Inhale 1 puff into the lungs daily.   No facility-administered encounter medications on file as of 09/09/2023.    Surgical History: Past Surgical History:  Procedure Laterality Date   ABSCESS DRAINAGE  11/17/2013   buttock right   BREAST BIOPSY Right    neg   CATARACT EXTRACTION W/PHACO Right 08/17/2019   Procedure: CATARACT EXTRACTION PHACO AND INTRAOCULAR LENS PLACEMENT (IOC) RIGHT VISION BLUE 5.32  00:42.7;  Surgeon: Ola Berger, MD;  Location: [facility];  Service: Ophthalmology;  Laterality: Right;   CATARACT EXTRACTION W/PHACO Left 09/21/2019   Procedure: CATARACT EXTRACTION PHACO AND INTRAOCULAR LENS PLACEMENT (IOC) LEFT VISION BLUE;  Surgeon: Ola Berger, MD;  Location: [facility];  Service: Ophthalmology;  Laterality: Left;  6.24 0:40.6   CHOLECYSTECTOMY  2008   COLONOSCOPY  2014   [facility] [place]   COLONOSCOPY WITH PROPOFOL  N/A 08/26/2015   Procedure: COLONOSCOPY WITH PROPOFOL ;  Surgeon: Stephens Eis, MD;  Location: [facility] ENDOSCOPY;  Service: Gastroenterology;  Laterality: N/A;   COLONOSCOPY WITH PROPOFOL  N/A 03/03/2021   Procedure: COLONOSCOPY WITH PROPOFOL ;  Surgeon: Shane Darling, MD;  Location: [facility] ENDOSCOPY;  Service: Endoscopy;  Laterality: N/A;   DILATION AND CURETTAGE OF UTERUS     ESOPHAGOGASTRODUODENOSCOPY N/A 11/15/2014   Procedure: ESOPHAGOGASTRODUODENOSCOPY (EGD);  Surgeon: Stephens Eis, MD;  Location: [facility] ENDOSCOPY;  Service: Gastroenterology;  Laterality: N/A;  ESOPHAGOGASTRODUODENOSCOPY (EGD) WITH PROPOFOL  N/A 03/03/2021   Procedure: ESOPHAGOGASTRODUODENOSCOPY (EGD) WITH PROPOFOL ;  Surgeon: Shane Darling, MD;  Location: [facility] ENDOSCOPY;  Service: Endoscopy;  Laterality: N/A;   EYE SURGERY     GASTROSTOMY     UPPER GI ENDOSCOPY  2014   [facility] in  [place]    Medical History: Past Medical History:  Diagnosis Date   Anxiety    Arthritis    Atrophic kidney    Chronic constipation    Colon polyp 2013   COPD (chronic obstructive pulmonary disease) (HCC)    Depression    Diverticulosis    Esophageal stricture    Esophagitis    Fibrocystic breast disease    GERD (gastroesophageal reflux disease)    History of hiatal hernia    Hypertension    IBS (irritable bowel syndrome)    Menopause    Ovarian cyst    Perirectal abscess    Plantar fasciitis    Postmenopausal    Renal artery occlusion (HCC)    Renal insufficiency    Right kidney is small and not functioning.    Family History: Family History  Problem Relation Age of Onset   Heart disease Mother    Heart attack Mother    Diabetes Mother    Alzheimer's disease Father    Breast cancer Sister 35   Lung cancer Brother    Brain cancer Brother    Lung cancer Sister    Bladder Cancer Sister    Lung cancer Brother    Leukemia Brother    Diabetes Brother     Social History   Socioeconomic History   Marital status: Widowed    Spouse name: Not on file   Number of children: Not on file   Years of education: Not on file   Highest education level: Not on file  Occupational History   Not on file  Tobacco Use   Smoking status: Former    Current packs/day: 0.50    Average packs/day: 0.5 packs/day for 30.0 years (15.0 ttl pk-yrs)    Types: Cigarettes   Smokeless tobacco: Never   Tobacco comments:    Quit January 2024  Vaping Use   Vaping status: Never Used  Substance and Sexual Activity   Alcohol use: No   Drug use: No   Sexual activity: Not on file  Other Topics Concern   Not on file  Social History Narrative   Not on file   Social Drivers of Health   Financial Resource Strain: Low Risk  (08/18/2023)   Received from [facility]   Overall Financial Resource Strain (CARDIA)    Difficulty of Paying Living Expenses: Not very hard  Food  Insecurity: No Food Insecurity (08/18/2023)   Received from [facility]   Hunger Vital Sign    Worried About Running Out of Food in the Last Year: Never true    Ran Out of Food in the Last Year: Never true  Transportation Needs: No Transportation Needs (08/18/2023)   Received from [facility] - Transportation    In the past 12 months, has lack of transportation kept you from medical appointments or from getting medications?: No    Lack of Transportation (Non-Medical): No  Physical Activity: Not on file  Stress: Not on file  Social Connections: Patient Declined (05/08/2023)   Social Connection and Isolation Panel [NHANES]    Frequency of Communication with Friends and Family: Patient declined  Frequency of Social Gatherings with Friends and Family: Patient declined    Attends Religious Services: Patient declined    Active Member of Clubs or Organizations: Patient declined    Attends Banker Meetings: Patient declined    Marital Status: Patient declined  Intimate Partner Violence: Not At Risk (05/08/2023)   Humiliation, Afraid, Rape, and Kick questionnaire    Fear of Current or Ex-Partner: No    Emotionally Abused: No    Physically Abused: No    Sexually Abused: No      Review of Systems  Constitutional:  Positive for fatigue. Negative for chills and unexpected weight change.  HENT:  Positive for postnasal drip. Negative for congestion, rhinorrhea, sneezing and sore throat.   Eyes:  Negative for redness.  Respiratory:  Negative for cough, chest tightness and shortness of breath.   Cardiovascular:  Negative for chest pain and palpitations.  Gastrointestinal:  Negative for abdominal pain, constipation, diarrhea and vomiting.  Genitourinary:  Negative for dysuria and frequency.  Musculoskeletal:  Negative for arthralgias, back pain, joint swelling and neck pain.  Skin:  Negative for rash.  Neurological: Negative.  Negative  for tremors.  Hematological:  Negative for adenopathy. Does not bruise/bleed easily.  Psychiatric/Behavioral:  Negative for behavioral problems (Depression), sleep disturbance and suicidal ideas. The patient is not nervous/anxious.     Vital Signs: BP (!) 144/80 Comment: 145/80  Pulse 88   Temp 98 F (36.7 C)   Resp 16   Ht 5\' 8"  (1.727 m)   Wt 158 lb 6.4 oz (71.8 kg)   SpO2 96%   BMI 24.08 kg/m    Physical Exam Vitals and nursing note reviewed.  Constitutional:      Appearance: Normal appearance.  HENT:     Head: Normocephalic and atraumatic.  Eyes:     Extraocular Movements: Extraocular movements intact.  Cardiovascular:     Rate and Rhythm: Normal rate and regular rhythm.  Pulmonary:     Effort: Pulmonary effort is normal.     Breath sounds: Normal breath sounds.  Skin:    General: Skin is warm and dry.  Neurological:     Mental Status: She is alert.     Gait: Gait normal.  Psychiatric:        Behavior: Behavior normal.        Assessment/Plan: 1. Essential hypertension (Primary) Borderline in office, but has been well controlled at home and will continue to monitor.   2. History of stroke Followed by nephrology  3. Cyst of right ovary Cyst on US  correlates with mass on CT, however given this and fluid in canal in postmenopausal female, will refer to GYN for further eval - Ambulatory referral to Obstetrics / Gynecology  4. Fluid in endometrial cavity Will refer for further eval - Ambulatory referral to Obstetrics / Gynecology  5. Stage 3b chronic kidney disease (HCC) Will continue to monitor, pt should also continue to follow up with nephrology - Comprehensive metabolic panel with GFR  6. Prediabetes - Hgb A1C w/o eAG  7. Mixed hyperlipidemia Continue statin and zetia  and will update labs - Lipid Panel With LDL/HDL Ratio  8. B12 deficiency - B12 and Folate Panel  9. Vitamin D  deficiency - VITAMIN D  25 Hydroxy (Vit-D Deficiency,  Fractures)  10. Abnormal thyroid  blood test - TSH + free T4  11. Other fatigue - Fe+TIBC+Fer - VITAMIN D  25 Hydroxy (Vit-D Deficiency, Fractures) - B12 and Folate Panel - TSH + free T4 - CBC w/Diff/Platelet -  Hgb A1C w/o eAG - Comprehensive metabolic panel with GFR - Lipid Panel With LDL/HDL Ratio   General Counseling: jeriyah granlund understanding of the findings of todays visit and agrees with plan of treatment. I have discussed any further diagnostic evaluation that may be needed or ordered today. We also reviewed her medications today. she has been encouraged to call the office with any questions or concerns that should arise related to todays visit.    Orders Placed This Encounter  Procedures   Fe+TIBC+Fer   VITAMIN D  25 Hydroxy (Vit-D Deficiency, Fractures)   B12 and Folate Panel   TSH + free T4   CBC w/Diff/Platelet   Hgb A1C w/o eAG   Comprehensive metabolic panel with GFR   Lipid Panel With LDL/HDL Ratio   Ambulatory referral to Obstetrics / Gynecology    No orders of the defined types were placed in this encounter.   This patient was seen by Taylor Favia, PA-C in collaboration with Dr. Verneta Gone as a part of collaborative care agreement.   Total time spent:30 Minutes Time spent includes review of chart, medications, test results, and follow up plan with the patient.      Dr Fozia M Khan Internal medicine

## 2023-09-10 ENCOUNTER — Telehealth: Payer: Self-pay | Admitting: Physician Assistant

## 2023-09-10 NOTE — Telephone Encounter (Signed)
 GYN referral faxed to Dr. Ricky Charter w/ [facility]; [phone].  Notified patient. Gave pt telephone [phone]

## 2023-09-11 ENCOUNTER — Ambulatory Visit: Payer: Self-pay | Admitting: Internal Medicine

## 2023-09-11 NOTE — Progress Notes (Signed)
 Discussed.

## 2023-09-21 ENCOUNTER — Other Ambulatory Visit: Payer: Self-pay

## 2023-09-21 DIAGNOSIS — F330 Major depressive disorder, recurrent, mild: Secondary | ICD-10-CM

## 2023-09-21 MED ORDER — DULOXETINE HCL 20 MG PO CPEP: 20.0000 mg | ORAL_CAPSULE | Freq: Two times a day (BID) | ORAL | 1 refills | Status: DC

## 2023-09-22 DIAGNOSIS — R5383 Other fatigue: Secondary | ICD-10-CM | POA: Diagnosis not present

## 2023-09-22 DIAGNOSIS — R7303 Prediabetes: Secondary | ICD-10-CM | POA: Diagnosis not present

## 2023-09-22 DIAGNOSIS — E538 Deficiency of other specified B group vitamins: Secondary | ICD-10-CM | POA: Diagnosis not present

## 2023-09-22 DIAGNOSIS — N1832 Chronic kidney disease, stage 3b: Secondary | ICD-10-CM | POA: Diagnosis not present

## 2023-09-22 DIAGNOSIS — E559 Vitamin D deficiency, unspecified: Secondary | ICD-10-CM | POA: Diagnosis not present

## 2023-09-22 DIAGNOSIS — E782 Mixed hyperlipidemia: Secondary | ICD-10-CM | POA: Diagnosis not present

## 2023-09-23 LAB — LIPID PANEL WITH LDL/HDL RATIO
Cholesterol, Total: 120 mg/dL (ref 100–199)
HDL: 48 mg/dL
LDL Chol Calc (NIH): 48 mg/dL (ref 0–99)
LDL/HDL Ratio: 1 ratio (ref 0.0–3.2)
Triglycerides: 140 mg/dL (ref 0–149)
VLDL Cholesterol Cal: 24 mg/dL (ref 5–40)

## 2023-09-23 LAB — CBC WITH DIFFERENTIAL/PLATELET
Basophils Absolute: 0.1 10*3/uL (ref 0.0–0.2)
Basos: 1 %
EOS (ABSOLUTE): 0.4 10*3/uL (ref 0.0–0.4)
Eos: 5 %
Hematocrit: 43.2 % (ref 34.0–46.6)
Hemoglobin: 14 g/dL (ref 11.1–15.9)
Immature Grans (Abs): 0 10*3/uL (ref 0.0–0.1)
Immature Granulocytes: 0 %
Lymphocytes Absolute: 1.9 10*3/uL (ref 0.7–3.1)
Lymphs: 26 %
MCH: 29.3 pg (ref 26.6–33.0)
MCHC: 32.4 g/dL (ref 31.5–35.7)
MCV: 90 fL (ref 79–97)
Monocytes Absolute: 0.5 10*3/uL (ref 0.1–0.9)
Monocytes: 7 %
Neutrophils Absolute: 4.2 10*3/uL (ref 1.4–7.0)
Neutrophils: 61 %
Platelets: 300 10*3/uL (ref 150–450)
RBC: 4.78 x10E6/uL (ref 3.77–5.28)
RDW: 13 % (ref 11.7–15.4)
WBC: 7 10*3/uL (ref 3.4–10.8)

## 2023-09-23 LAB — TSH+FREE T4
Free T4: 1.13 ng/dL (ref 0.82–1.77)
TSH: 2.34 u[IU]/mL (ref 0.450–4.500)

## 2023-09-23 LAB — COMPREHENSIVE METABOLIC PANEL WITH GFR
ALT: 16 [IU]/L (ref 0–32)
AST: 25 [IU]/L (ref 0–40)
Albumin: 4.3 g/dL (ref 3.8–4.8)
Alkaline Phosphatase: 85 [IU]/L (ref 44–121)
BUN/Creatinine Ratio: 13 (ref 12–28)
BUN: 22 mg/dL (ref 8–27)
Bilirubin Total: 0.4 mg/dL (ref 0.0–1.2)
CO2: 22 mmol/L (ref 20–29)
Calcium: 9.6 mg/dL (ref 8.7–10.3)
Chloride: 101 mmol/L (ref 96–106)
Creatinine, Ser: 1.63 mg/dL — ABNORMAL HIGH (ref 0.57–1.00)
Globulin, Total: 2.3 g/dL (ref 1.5–4.5)
Glucose: 105 mg/dL — ABNORMAL HIGH (ref 70–99)
Potassium: 4.8 mmol/L (ref 3.5–5.2)
Sodium: 141 mmol/L (ref 134–144)
Total Protein: 6.6 g/dL (ref 6.0–8.5)
eGFR: 33 mL/min/{1.73_m2} — ABNORMAL LOW

## 2023-09-23 LAB — IRON,TIBC AND FERRITIN PANEL
Ferritin: 86 ng/mL (ref 15–150)
Iron Saturation: 30 % (ref 15–55)
Iron: 97 ug/dL (ref 27–139)
Total Iron Binding Capacity: 328 ug/dL (ref 250–450)
UIBC: 231 ug/dL (ref 118–369)

## 2023-09-23 LAB — B12 AND FOLATE PANEL
Folate: 15.3 ng/mL
Vitamin B-12: 269 pg/mL (ref 232–1245)

## 2023-09-23 LAB — HGB A1C W/O EAG: Hgb A1c MFr Bld: 5.8 % — ABNORMAL HIGH (ref 4.8–5.6)

## 2023-09-23 LAB — VITAMIN D 25 HYDROXY (VIT D DEFICIENCY, FRACTURES): Vit D, 25-Hydroxy: 41.9 ng/mL (ref 30.0–100.0)

## 2023-09-28 ENCOUNTER — Ambulatory Visit: Payer: Self-pay | Admitting: Physician Assistant

## 2023-09-29 NOTE — Telephone Encounter (Signed)
-----   Message from Jacques Mattock sent at 09/28/2023 11:12 AM EDT ----- Please let her know that her A1c improved slightly to 5.8. Renal function back to baseline. Cholesterol greatly improved. B12 is low and may consider B12 injections

## 2023-09-29 NOTE — Telephone Encounter (Signed)
 Pt notified for labs  and also made her appt for B12  and need once a week for 3 weeks and then once a month

## 2023-09-30 DIAGNOSIS — R1903 Right lower quadrant abdominal swelling, mass and lump: Secondary | ICD-10-CM | POA: Diagnosis not present

## 2023-09-30 DIAGNOSIS — R9389 Abnormal findings on diagnostic imaging of other specified body structures: Secondary | ICD-10-CM | POA: Diagnosis not present

## 2023-09-30 DIAGNOSIS — N83201 Unspecified ovarian cyst, right side: Secondary | ICD-10-CM | POA: Diagnosis not present

## 2023-10-01 ENCOUNTER — Ambulatory Visit (INDEPENDENT_AMBULATORY_CARE_PROVIDER_SITE_OTHER)

## 2023-10-01 DIAGNOSIS — E538 Deficiency of other specified B group vitamins: Secondary | ICD-10-CM

## 2023-10-01 MED ORDER — CYANOCOBALAMIN 1000 MCG/ML IJ SOLN
1000.0000 ug | Freq: Once | INTRAMUSCULAR | Status: AC
  Administered 2023-10-01: 1000 ug via INTRAMUSCULAR

## 2023-10-08 ENCOUNTER — Ambulatory Visit (INDEPENDENT_AMBULATORY_CARE_PROVIDER_SITE_OTHER)

## 2023-10-08 DIAGNOSIS — E538 Deficiency of other specified B group vitamins: Secondary | ICD-10-CM | POA: Diagnosis not present

## 2023-10-08 MED ORDER — CYANOCOBALAMIN 1000 MCG/ML IJ SOLN
1000.0000 ug | Freq: Once | INTRAMUSCULAR | Status: AC
  Administered 2023-10-08: 1000 ug via INTRAMUSCULAR

## 2023-10-13 DIAGNOSIS — N270 Small kidney, unilateral: Secondary | ICD-10-CM | POA: Diagnosis not present

## 2023-10-13 DIAGNOSIS — K219 Gastro-esophageal reflux disease without esophagitis: Secondary | ICD-10-CM | POA: Diagnosis not present

## 2023-10-13 DIAGNOSIS — N1832 Chronic kidney disease, stage 3b: Secondary | ICD-10-CM | POA: Diagnosis not present

## 2023-10-13 DIAGNOSIS — N261 Atrophy of kidney (terminal): Secondary | ICD-10-CM | POA: Diagnosis not present

## 2023-10-13 DIAGNOSIS — F419 Anxiety disorder, unspecified: Secondary | ICD-10-CM | POA: Diagnosis not present

## 2023-10-13 DIAGNOSIS — E7800 Pure hypercholesterolemia, unspecified: Secondary | ICD-10-CM | POA: Diagnosis not present

## 2023-10-13 DIAGNOSIS — I639 Cerebral infarction, unspecified: Secondary | ICD-10-CM | POA: Diagnosis not present

## 2023-10-13 DIAGNOSIS — J449 Chronic obstructive pulmonary disease, unspecified: Secondary | ICD-10-CM | POA: Diagnosis not present

## 2023-10-13 DIAGNOSIS — N2889 Other specified disorders of kidney and ureter: Secondary | ICD-10-CM | POA: Diagnosis not present

## 2023-10-13 DIAGNOSIS — I10 Essential (primary) hypertension: Secondary | ICD-10-CM | POA: Diagnosis not present

## 2023-10-15 ENCOUNTER — Ambulatory Visit (INDEPENDENT_AMBULATORY_CARE_PROVIDER_SITE_OTHER)

## 2023-10-15 DIAGNOSIS — E538 Deficiency of other specified B group vitamins: Secondary | ICD-10-CM | POA: Diagnosis not present

## 2023-10-15 MED ORDER — CYANOCOBALAMIN 1000 MCG/ML IJ SOLN
1000.0000 ug | Freq: Once | INTRAMUSCULAR | Status: AC
  Administered 2023-10-15: 1000 ug via INTRAMUSCULAR

## 2023-10-18 ENCOUNTER — Other Ambulatory Visit: Payer: Self-pay | Admitting: Physician Assistant

## 2023-10-18 DIAGNOSIS — Z1231 Encounter for screening mammogram for malignant neoplasm of breast: Secondary | ICD-10-CM

## 2023-10-21 ENCOUNTER — Encounter: Payer: Self-pay | Admitting: Physician Assistant

## 2023-10-21 ENCOUNTER — Ambulatory Visit: Payer: Medicare HMO | Admitting: Physician Assistant

## 2023-10-21 VITALS — BP 142/70 | HR 76 | Temp 98.4°F | Resp 16 | Ht 68.0 in | Wt 159.0 lb

## 2023-10-21 DIAGNOSIS — Z0000 Encounter for general adult medical examination without abnormal findings: Secondary | ICD-10-CM

## 2023-10-21 DIAGNOSIS — J432 Centrilobular emphysema: Secondary | ICD-10-CM | POA: Diagnosis not present

## 2023-10-21 DIAGNOSIS — Z87891 Personal history of nicotine dependence: Secondary | ICD-10-CM | POA: Diagnosis not present

## 2023-10-21 DIAGNOSIS — E782 Mixed hyperlipidemia: Secondary | ICD-10-CM

## 2023-10-21 DIAGNOSIS — I10 Essential (primary) hypertension: Secondary | ICD-10-CM | POA: Diagnosis not present

## 2023-10-21 MED ORDER — ASPIRIN 81 MG PO TBEC: 81.0000 mg | DELAYED_RELEASE_TABLET | Freq: Every day | ORAL | 3 refills | Status: AC

## 2023-10-21 MED ORDER — ROSUVASTATIN CALCIUM 10 MG PO TABS: 10.0000 mg | ORAL_TABLET | Freq: Every day | ORAL | 3 refills | Status: AC

## 2023-10-21 NOTE — Progress Notes (Signed)
 [facility] [address] [place], [place] [address]  Internal MEDICINE  Office Visit Note  Patient Name: Shannon Li  [id number]  [id number]  Date of Service: 10/21/2023  Chief Complaint  Patient presents with   Medicare Wellness   Depression   Gastroesophageal Reflux   Hypertension    HPI Shannon Li presents for an annual well visit Well-appearing 73 y.o.female Routine CRC screening: UTD Routine mammogram: mammogram scheduled for Aug Labs: previously reviewed -Needs refills for crestor  and ASA -BP at home 130s typically, recheck in office borderline at 142/70 -did see GYN, CA125 negative, will be going back in a few months -no breathing concerns, doing well since quitting smoking. Declines needing inhalers. Declines PFT for now, but open to lung cancer screening     10/21/2023    1:34 PM 10/16/2022   11:12 AM 10/10/2021   10:51 AM  MMSE - Mini Mental State Exam  Orientation to time 5 5 5   Orientation to Place 5 5 5   Registration 3 3 3   Attention/ Calculation 5 5 5   Recall 3 3 3   Language- name 2 objects 2 2 2   Language- repeat 1 1 1   Language- follow 3 step command 3 3 3   Language- read & follow direction 1 1 1   Write a sentence 1 1 1   Copy design 1 1 1   Total score 30 30 30     Functional Status Survey: Is the patient deaf or have difficulty hearing?: No Does the patient have difficulty seeing, even when wearing glasses/contacts?: No Does the patient have difficulty concentrating, remembering, or making decisions?: No Does the patient have difficulty walking or climbing stairs?: No Does the patient have difficulty dressing or bathing?: No Does the patient have difficulty doing errands alone such as visiting a doctor's office or shopping?: No     12/24/2022    8:43 AM 02/25/2023   10:26 AM 08/16/2023    1:18 PM 08/16/2023    1:22 PM 10/21/2023    1:33 PM  Fall Risk  Falls in the past year? 0 0 0 0 0  Was there an injury with Fall?  0     Fall  Risk Category Calculator  0     Patient at Risk for Falls Due to No Fall Risks No Fall Risks  No Fall Risks   Fall risk Follow up Falls evaluation completed Falls evaluation completed Falls evaluation completed Falls evaluation completed        08/16/2023    1:22 PM  Depression screen PHQ 2/9  Decreased Interest 0  Down, Depressed, Hopeless 0  PHQ - 2 Score 0        No data to display            Current Medication: Outpatient Encounter Medications as of 10/21/2023  Medication Sig   amLODipine  (NORVASC ) 5 MG tablet TAKE 1 TABLET (5 MG TOTAL) BY MOUTH DAILY.   DULoxetine  (CYMBALTA ) 20 MG capsule Take 1 capsule (20 mg total) by mouth 2 (two) times daily.   ezetimibe  (ZETIA ) 10 MG tablet TAKE 1 TABLET BY MOUTH EVERY DAY   famotidine (PEPCID) 40 MG tablet Take 40 mg by mouth daily.   hydrochlorothiazide  (HYDRODIURIL ) 12.5 MG tablet Take 1/2 tablet by mouth once daily   pantoprazole  (PROTONIX ) 40 MG tablet TAKE 1 TABLET BY MOUTH EVERY DAY   [DISCONTINUED] aspirin  EC 81 MG tablet Take 1 tablet (81 mg total) by mouth daily. Swallow whole.   [DISCONTINUED] rosuvastatin  (CRESTOR ) 10 MG  tablet Take 1 tablet (10 mg total) by mouth daily.   aspirin  EC 81 MG tablet Take 1 tablet (81 mg total) by mouth daily. Swallow whole.   rosuvastatin  (CRESTOR ) 10 MG tablet Take 1 tablet (10 mg total) by mouth daily.   No facility-administered encounter medications on file as of 10/21/2023.    Surgical History: Past Surgical History:  Procedure Laterality Date   ABSCESS DRAINAGE  11/17/2013   buttock right   BREAST BIOPSY Right    neg   CATARACT EXTRACTION W/PHACO Right 08/17/2019   Procedure: CATARACT EXTRACTION PHACO AND INTRAOCULAR LENS PLACEMENT (IOC) RIGHT VISION BLUE 5.32  00:42.7;  Surgeon: Ferol Rogue, MD;  Location: [facility];  Service: Ophthalmology;  Laterality: Right;   CATARACT EXTRACTION W/PHACO Left 09/21/2019   Procedure: CATARACT EXTRACTION PHACO AND INTRAOCULAR LENS  PLACEMENT (IOC) LEFT VISION BLUE;  Surgeon: Ferol Rogue, MD;  Location: [facility];  Service: Ophthalmology;  Laterality: Left;  6.24 0:40.6   CHOLECYSTECTOMY  2008   COLONOSCOPY  2014   [facility] [place]   COLONOSCOPY WITH PROPOFOL  N/A 08/26/2015   Procedure: COLONOSCOPY WITH PROPOFOL ;  Surgeon: Deward CINDERELLA Piedmont, MD;  Location: [facility] END[facility]COPY;  Service: Gastroenterology;  Laterality: N/A;   COLONOSCOPY WITH PROPOFOL  N/A 03/03/2021   Procedure: COLONOSCOPY WITH PROPOFOL ;  Surgeon: Maryruth Ole DASEN, MD;  Location: [facility] ENDOSCOPY;  Service: Endoscopy;  Laterality: N/A;   DILATION AND CURETTAGE OF UTERUS     ESOPHAGOGASTRODUODENOSCOPY N/A 11/15/2014   Procedure: ESOPHAGOGASTRODUODENOSCOPY (EGD);  Surgeon: Deward CINDERELLA Piedmont, MD;  Location: [facility] ENDOSCOPY;  Service: Gastroenterology;  Laterality: N/A;   ESOPHAGOGASTRODUODENOSCOPY (EGD) WITH PROPOFOL  N/A 03/03/2021   Procedure: ESOPHAGOGASTRODUODENOSCOPY (EGD) WITH PROPOFOL ;  Surgeon: Maryruth Ole DASEN, MD;  Location: [facility] ENDOSCOPY;  Service: Endoscopy;  Laterality: N/A;   EYE SURGERY     GASTROSTOMY     UPPER GI ENDOSCOPY  2014   [facility] in [place]    Medical History: Past Medical History:  Diagnosis Date   Anxiety    Arthritis    Atrophic kidney    Chronic constipation    Colon polyp 2013   COPD (chronic obstructive pulmonary disease) (HCC)    Depression    Diverticulosis    Esophageal stricture    Esophagitis    Fibrocystic breast disease    GERD (gastroesophageal reflux disease)    History of hiatal hernia    Hypertension    IBS (irritable bowel syndrome)    Menopause    Ovarian cyst    Perirectal abscess    Plantar fasciitis    Postmenopausal    Renal artery occlusion (HCC)    Renal insufficiency    Right kidney is small and not functioning.    Family History: Family History  Problem Relation Age of Onset   Heart disease Mother    Heart attack Mother    Diabetes Mother    Alzheimer's  disease Father    Breast cancer Sister 53   Lung cancer Brother    Brain cancer Brother    Lung cancer Sister    Bladder Cancer Sister    Lung cancer Brother    Leukemia Brother    Diabetes Brother     Social History   Socioeconomic History   Marital status: Widowed    Spouse name: Not on file   Number of children: Not on file   Years of education: Not on file   Highest education level: Not on file  Occupational History  Not on file  Tobacco Use   Smoking status: Former    Current packs/day: 0.50    Average packs/day: 0.5 packs/day for 30.0 years (15.0 ttl pk-yrs)    Types: Cigarettes   Smokeless tobacco: Never   Tobacco comments:    Quit January 2024  Vaping Use   Vaping status: Never Used  Substance and Sexual Activity   Alcohol use: No   Drug use: No   Sexual activity: Not on file  Other Topics Concern   Not on file  Social History Narrative   Not on file   Social Drivers of Health   Financial Resource Strain: Low Risk  (08/18/2023)   Received from [facility]   Overall Financial Resource Strain (CARDIA)    Difficulty of Paying Living Expenses: Not very hard  Food Insecurity: No Food Insecurity (08/18/2023)   Received from [facility]   Hunger Vital Sign    Within the past 12 months, you worried that your food would run out before you got the money to buy more.: Never true    Within the past 12 months, the food you bought just didn't last and you didn't have money to get more.: Never true  Transportation Needs: No Transportation Needs (08/18/2023)   Received from [facility] - Transportation    In the past 12 months, has lack of transportation kept you from medical appointments or from getting medications?: No    Lack of Transportation (Non-Medical): No  Physical Activity: Not on file  Stress: Not on file  Social Connections: Patient Declined (05/08/2023)   Social Connection and Isolation Panel     Frequency of Communication with Friends and Family: Patient declined    Frequency of Social Gatherings with Friends and Family: Patient declined    Attends Religious Services: Patient declined    Database administrator or Organizations: Patient declined    Attends Banker Meetings: Patient declined    Marital Status: Patient declined  Intimate Partner Violence: Not At Risk (05/08/2023)   Humiliation, Afraid, Rape, and Kick questionnaire    Fear of Current or Ex-Partner: No    Emotionally Abused: No    Physically Abused: No    Sexually Abused: No      Review of Systems  Constitutional:  Positive for fatigue. Negative for chills and unexpected weight change.  HENT:  Positive for postnasal drip. Negative for congestion, rhinorrhea, sneezing and sore throat.   Eyes:  Negative for redness.  Respiratory:  Negative for cough, chest tightness and shortness of breath.   Cardiovascular:  Negative for chest pain and palpitations.  Gastrointestinal:  Negative for abdominal pain, constipation, diarrhea and vomiting.  Genitourinary:  Negative for dysuria and frequency.  Musculoskeletal:  Negative for arthralgias, back pain, joint swelling and neck pain.  Skin:  Negative for rash.  Neurological: Negative.  Negative for tremors.  Hematological:  Negative for adenopathy. Does not bruise/bleed easily.  Psychiatric/Behavioral:  Negative for behavioral problems (Depression), sleep disturbance and suicidal ideas. The patient is not nervous/anxious.     Vital Signs: BP (!) 142/70 Comment: 162/86  Pulse 76   Temp 98.4 F (36.9 C)   Resp 16   Ht 5' 8 (1.727 m)   Wt 159 lb (72.1 kg)   SpO2 96%   BMI 24.18 kg/m    Physical Exam Vitals and nursing note reviewed.  Constitutional:      Appearance: Normal appearance.  HENT:  Head: Normocephalic and atraumatic.  Eyes:     Extraocular Movements: Extraocular movements intact.  Cardiovascular:     Rate and Rhythm: Normal rate  and regular rhythm.  Pulmonary:     Effort: Pulmonary effort is normal.     Breath sounds: Normal breath sounds.  Skin:    General: Skin is warm and dry.  Neurological:     Mental Status: She is alert.     Gait: Gait normal.  Psychiatric:        Behavior: Behavior normal.        Assessment/Plan: 1. Encounter for annual wellness exam in Medicare patient (Primary) AWV performed, UTD On PHM--mammogram scheduled next month   2. Essential hypertension Borderline in office, but doing well at home and previously would get dizzy on higher meds. Will continue to monitor  3. Mixed hyperlipidemia Continue crestor  and will send refills - rosuvastatin  (CRESTOR ) 10 MG tablet; Take 1 tablet (10 mg total) by mouth daily.  Dispense: 90 tablet; Refill: 3  4. Former smoker - CT CHEST LUNG CA SCREEN LOW DOSE W/O CM; Future  5. Centrilobular emphysema (HCC) Declines inhalers or PFT as breathing doing well since quitting smoking. Will have updated CT chest screening    General Counseling: miaisabella bacorn understanding of the findings of todays visit and agrees with plan of treatment. I have discussed any further diagnostic evaluation that may be needed or ordered today. We also reviewed her medications today. she has been encouraged to call the office with any questions or concerns that should arise related to todays visit.    Orders Placed This Encounter  Procedures   CT CHEST LUNG CA SCREEN LOW DOSE W/O CM    Meds ordered this encounter  Medications   aspirin  EC 81 MG tablet    Sig: Take 1 tablet (81 mg total) by mouth daily. Swallow whole.    Dispense:  90 tablet    Refill:  3   rosuvastatin  (CRESTOR ) 10 MG tablet    Sig: Take 1 tablet (10 mg total) by mouth daily.    Dispense:  90 tablet    Refill:  3    Return for CT lung not due until end of Aug, after test is done.   Total time spent:35 Minutes Time spent includes review of chart, medications, test results, and  follow up plan with the patient.   [place] Controlled Substance Database was reviewed by me.  This patient was seen by Tinnie Pro, PA-C in collaboration with Dr. Sigrid Bathe as a part of collaborative care agreement.  Tinnie Pro, PA-C Internal medicine

## 2023-10-24 ENCOUNTER — Other Ambulatory Visit: Payer: Self-pay | Admitting: Nurse Practitioner

## 2023-10-24 DIAGNOSIS — L2389 Allergic contact dermatitis due to other agents: Secondary | ICD-10-CM

## 2023-11-12 ENCOUNTER — Ambulatory Visit (INDEPENDENT_AMBULATORY_CARE_PROVIDER_SITE_OTHER)

## 2023-11-12 DIAGNOSIS — E538 Deficiency of other specified B group vitamins: Secondary | ICD-10-CM | POA: Diagnosis not present

## 2023-11-12 MED ORDER — CYANOCOBALAMIN 1000 MCG/ML IJ SOLN
1000.0000 ug | Freq: Once | INTRAMUSCULAR | Status: AC
  Administered 2023-11-12: 1000 ug via INTRAMUSCULAR

## 2023-12-02 ENCOUNTER — Ambulatory Visit
Admission: RE | Admit: 2023-12-02 | Discharge: 2023-12-02 | Disposition: A | Discharge status: Home / Self Care | Source: Ambulatory Visit | Attending: Physician Assistant | Admitting: Physician Assistant

## 2023-12-02 DIAGNOSIS — Z1231 Encounter for screening mammogram for malignant neoplasm of breast: Secondary | ICD-10-CM | POA: Insufficient documentation

## 2023-12-07 ENCOUNTER — Other Ambulatory Visit: Payer: Self-pay | Admitting: Physician Assistant

## 2023-12-07 DIAGNOSIS — E782 Mixed hyperlipidemia: Secondary | ICD-10-CM

## 2023-12-10 ENCOUNTER — Ambulatory Visit (INDEPENDENT_AMBULATORY_CARE_PROVIDER_SITE_OTHER)

## 2023-12-10 DIAGNOSIS — E538 Deficiency of other specified B group vitamins: Secondary | ICD-10-CM | POA: Diagnosis not present

## 2023-12-10 MED ORDER — CYANOCOBALAMIN 1000 MCG/ML IJ SOLN
1000.0000 ug | Freq: Once | INTRAMUSCULAR | Status: AC
  Administered 2023-12-10: 1000 ug via INTRAMUSCULAR

## 2023-12-15 ENCOUNTER — Inpatient Hospital Stay: Admission: RE | Admit: 2023-12-15 | Source: Ambulatory Visit

## 2023-12-15 ENCOUNTER — Ambulatory Visit
Admission: RE | Admit: 2023-12-15 | Discharge: 2023-12-15 | Disposition: A | Discharge status: Home / Self Care | Source: Ambulatory Visit | Attending: Physician Assistant | Admitting: Physician Assistant

## 2023-12-15 DIAGNOSIS — Z87891 Personal history of nicotine dependence: Secondary | ICD-10-CM

## 2023-12-16 ENCOUNTER — Ambulatory Visit: Admitting: Physician Assistant

## 2023-12-19 ENCOUNTER — Other Ambulatory Visit: Payer: Self-pay | Admitting: Physician Assistant

## 2023-12-19 ENCOUNTER — Other Ambulatory Visit: Payer: Self-pay | Admitting: Nurse Practitioner

## 2023-12-19 DIAGNOSIS — L2389 Allergic contact dermatitis due to other agents: Secondary | ICD-10-CM

## 2023-12-19 DIAGNOSIS — M7989 Other specified soft tissue disorders: Secondary | ICD-10-CM

## 2023-12-19 DIAGNOSIS — K219 Gastro-esophageal reflux disease without esophagitis: Secondary | ICD-10-CM

## 2023-12-28 ENCOUNTER — Ambulatory Visit: Payer: Self-pay | Admitting: Physician Assistant

## 2024-01-13 ENCOUNTER — Ambulatory Visit (INDEPENDENT_AMBULATORY_CARE_PROVIDER_SITE_OTHER): Admitting: Physician Assistant

## 2024-01-13 ENCOUNTER — Telehealth: Payer: Self-pay | Admitting: Physician Assistant

## 2024-01-13 ENCOUNTER — Encounter: Payer: Self-pay | Admitting: Physician Assistant

## 2024-01-13 VITALS — BP 115/85 | HR 80 | Temp 98.1°F | Resp 16 | Ht 68.0 in | Wt 156.0 lb

## 2024-01-13 DIAGNOSIS — E538 Deficiency of other specified B group vitamins: Secondary | ICD-10-CM | POA: Diagnosis not present

## 2024-01-13 DIAGNOSIS — N261 Atrophy of kidney (terminal): Secondary | ICD-10-CM

## 2024-01-13 DIAGNOSIS — N1832 Chronic kidney disease, stage 3b: Secondary | ICD-10-CM

## 2024-01-13 DIAGNOSIS — Z8673 Personal history of transient ischemic attack (TIA), and cerebral infarction without residual deficits: Secondary | ICD-10-CM | POA: Diagnosis not present

## 2024-01-13 DIAGNOSIS — M79641 Pain in right hand: Secondary | ICD-10-CM

## 2024-01-13 DIAGNOSIS — M25552 Pain in left hip: Secondary | ICD-10-CM

## 2024-01-13 DIAGNOSIS — M79605 Pain in left leg: Secondary | ICD-10-CM | POA: Diagnosis not present

## 2024-01-13 DIAGNOSIS — R42 Dizziness and giddiness: Secondary | ICD-10-CM

## 2024-01-13 DIAGNOSIS — I700 Atherosclerosis of aorta: Secondary | ICD-10-CM | POA: Diagnosis not present

## 2024-01-13 MED ORDER — CYANOCOBALAMIN 1000 MCG/ML IJ SOLN
1000.0000 ug | Freq: Once | INTRAMUSCULAR | Status: AC
  Administered 2024-01-13: 1000 ug via INTRAMUSCULAR

## 2024-01-13 NOTE — Telephone Encounter (Signed)
 Notified patient of CT appointment date, arrival time, location-Toni

## 2024-01-13 NOTE — Progress Notes (Signed)
 [facility] [address] [place], [place] [address]  Internal MEDICINE  Office Visit Note  Patient Name: Shannon Li  [id number]  [id number]  Date of Service: 01/13/2024  Chief Complaint  Patient presents with   Follow-up    Review CT   Depression   Gastroesophageal Reflux   Hypertension   Hand Pain   Hip Pain   Dizziness    HPI Pt is here for routine follow up -sinus pressure and head fullness again, dizziness. Worse yesterday, better today. Has been going on for a month now though. -unsure if sinus related, but hasn't felt congestion and no coughing, drainage, or ear pressure. Did have some sneezing and thought maybe felt better after -she had dizziness with previous stroke, but this has been going on for over a month and therefore not acute now, discussed getting updated CT head, but to go to ED if new or worsening symptoms. May need ENT in future -has a follow up with neurology in 1 month -more joint pain, right ring finger tender along palm -left hip pain as well and down into leg -taking tylenol  -CT reviewed and stable from previous IMPRESSION: 1. Lung-RADS 2, benign appearance or behavior. Continue annual screening with low-dose chest CT without contrast in 12 months. 2. Aortic Atherosclerosis (ICD10-I70.0) and Emphysema (ICD10-J43.9). Coronary artery atherosclerosis. 3. Moderate right renal atrophy. -would like to hold pantoprazole  since pepcid works better and will try this alone  Current Medication: Outpatient Encounter Medications as of 01/13/2024  Medication Sig   aspirin  EC 81 MG tablet Take 1 tablet (81 mg total) by mouth daily. Swallow whole.   ezetimibe  (ZETIA ) 10 MG tablet TAKE 1 TABLET BY MOUTH EVERY DAY   famotidine (PEPCID) 40 MG tablet Take 40 mg by mouth daily.   hydrochlorothiazide  (HYDRODIURIL ) 12.5 MG tablet TAKE 1/2 TABLET BY MOUTH EVERY DAY   pantoprazole  (PROTONIX ) 40 MG tablet TAKE 1 TABLET BY MOUTH TWICE A DAY    rosuvastatin  (CRESTOR ) 10 MG tablet Take 1 tablet (10 mg total) by mouth daily.   [DISCONTINUED] amLODipine  (NORVASC ) 5 MG tablet TAKE 1 TABLET (5 MG TOTAL) BY MOUTH DAILY.   [DISCONTINUED] DULoxetine  (CYMBALTA ) 20 MG capsule Take 1 capsule (20 mg total) by mouth 2 (two) times daily.   [EXPIRED] cyanocobalamin  (VITAMIN B12) injection 1,000 mcg    No facility-administered encounter medications on file as of 01/13/2024.    Surgical History: Past Surgical History:  Procedure Laterality Date   ABSCESS DRAINAGE  11/17/2013   buttock right   BREAST BIOPSY Right    neg   CATARACT EXTRACTION W/PHACO Right 08/17/2019   Procedure: CATARACT EXTRACTION PHACO AND INTRAOCULAR LENS PLACEMENT (IOC) RIGHT VISION BLUE 5.32  00:42.7;  Surgeon: Ferol Rogue, MD;  Location: [facility];  Service: Ophthalmology;  Laterality: Right;   CATARACT EXTRACTION W/PHACO Left 09/21/2019   Procedure: CATARACT EXTRACTION PHACO AND INTRAOCULAR LENS PLACEMENT (IOC) LEFT VISION BLUE;  Surgeon: Ferol Rogue, MD;  Location: [facility];  Service: Ophthalmology;  Laterality: Left;  6.24 0:40.6   CHOLECYSTECTOMY  2008   COLONOSCOPY  2014   [facility] in [place]   COLONOSCOPY WITH PROPOFOL  N/A 08/26/2015   Procedure: COLONOSCOPY WITH PROPOFOL ;  Surgeon: Deward CINDERELLA Piedmont, MD;  Location: [facility] END[facility]COPY;  Service: Gastroenterology;  Laterality: N/A;   COLONOSCOPY WITH PROPOFOL  N/A 03/03/2021   Procedure: COLONOSCOPY WITH PROPOFOL ;  Surgeon: Maryruth Ole DASEN, MD;  Location: [facility] ENDOSCOPY;  Service: Endoscopy;  Laterality: N/A;   DILATION AND CURETTAGE  OF UTERUS     ESOPHAGOGASTRODUODENOSCOPY N/A 11/15/2014   Procedure: ESOPHAGOGASTRODUODENOSCOPY (EGD);  Surgeon: Deward CINDERELLA Piedmont, MD;  Location: [facility] ENDOSCOPY;  Service: Gastroenterology;  Laterality: N/A;   ESOPHAGOGASTRODUODENOSCOPY (EGD) WITH PROPOFOL  N/A 03/03/2021   Procedure: ESOPHAGOGASTRODUODENOSCOPY (EGD) WITH PROPOFOL ;  Surgeon: Maryruth Ole DASEN, MD;   Location: [facility] ENDOSCOPY;  Service: Endoscopy;  Laterality: N/A;   EYE SURGERY     GASTROSTOMY     UPPER GI ENDOSCOPY  2014   [facility] in [place]    Medical History: Past Medical History:  Diagnosis Date   Anxiety    Arthritis    Atrophic kidney    Chronic constipation    Colon polyp 2013   COPD (chronic obstructive pulmonary disease) (HCC)    Depression    Diverticulosis    Esophageal stricture    Esophagitis    Fibrocystic breast disease    GERD (gastroesophageal reflux disease)    History of hiatal hernia    Hypertension    IBS (irritable bowel syndrome)    Menopause    Ovarian cyst    Perirectal abscess    Plantar fasciitis    Postmenopausal    Renal artery occlusion    Renal insufficiency    Right kidney is small and not functioning.    Family History: Family History  Problem Relation Age of Onset   Heart disease Mother    Heart attack Mother    Diabetes Mother    Alzheimer's disease Father    Breast cancer Sister 22   Lung cancer Sister    Bladder Cancer Sister    Lung cancer Brother    Brain cancer Brother    Lung cancer Brother    Leukemia Brother    Diabetes Brother     Social History   Socioeconomic History   Marital status: Widowed    Spouse name: Not on file   Number of children: Not on file   Years of education: Not on file   Highest education level: Not on file  Occupational History   Not on file  Tobacco Use   Smoking status: Former    Current packs/day: 0.50    Average packs/day: 0.5 packs/day for 30.0 years (15.0 ttl pk-yrs)    Types: Cigarettes   Smokeless tobacco: Never   Tobacco comments:    Quit January 2024  Vaping Use   Vaping status: Never Used  Substance and Sexual Activity   Alcohol use: No   Drug use: No   Sexual activity: Not on file  Other Topics Concern   Not on file  Social History Narrative   Not on file   Social Drivers of Health   Financial Resource Strain: Low Risk  (08/18/2023)   Received  from [facility]   Overall Financial Resource Strain (CARDIA)    Difficulty of Paying Living Expenses: Not very hard  Food Insecurity: No Food Insecurity (08/18/2023)   Received from [facility]   Hunger Vital Sign    Within the past 12 months, you worried that your food would run out before you got the money to buy more.: Never true    Within the past 12 months, the food you bought just didn't last and you didn't have money to get more.: Never true  Transportation Needs: No Transportation Needs (08/18/2023)   Received from [facility] - Transportation    In the past 12 months, has lack of transportation kept you from  medical appointments or from getting medications?: No    Lack of Transportation (Non-Medical): No  Physical Activity: Not on file  Stress: Not on file  Social Connections: Patient Declined (05/08/2023)   Social Connection and Isolation Panel    Frequency of Communication with Friends and Family: Patient declined    Frequency of Social Gatherings with Friends and Family: Patient declined    Attends Religious Services: Patient declined    Database administrator or Organizations: Patient declined    Attends Banker Meetings: Patient declined    Marital Status: Patient declined  Intimate Partner Violence: Not At Risk (05/08/2023)   Humiliation, Afraid, Rape, and Kick questionnaire    Fear of Current or Ex-Partner: No    Emotionally Abused: No    Physically Abused: No    Sexually Abused: No      Review of Systems  Constitutional:  Positive for fatigue. Negative for chills and unexpected weight change.  HENT:  Positive for postnasal drip and sinus pressure. Negative for congestion, rhinorrhea, sneezing and sore throat.   Eyes:  Negative for redness.  Respiratory:  Negative for cough, chest tightness and shortness of breath.   Cardiovascular:  Negative for chest pain and palpitations.   Gastrointestinal:  Negative for abdominal pain, constipation, diarrhea and vomiting.  Genitourinary:  Negative for dysuria and frequency.  Musculoskeletal:  Positive for arthralgias. Negative for joint swelling and neck pain.  Skin:  Negative for rash.  Neurological:  Positive for dizziness. Negative for tremors.  Hematological:  Negative for adenopathy. Does not bruise/bleed easily.  Psychiatric/Behavioral:  Negative for behavioral problems (Depression), sleep disturbance and suicidal ideas. The patient is not nervous/anxious.     Vital Signs: BP 115/85   Pulse 80   Temp 98.1 F (36.7 C)   Resp 16   Ht 5' 8 (1.727 m)   Wt 156 lb (70.8 kg)   SpO2 94%   BMI 23.72 kg/m    Physical Exam Vitals and nursing note reviewed.  Constitutional:      Appearance: Normal appearance.  HENT:     Head: Normocephalic and atraumatic.  Eyes:     Extraocular Movements: Extraocular movements intact.  Cardiovascular:     Rate and Rhythm: Normal rate and regular rhythm.  Pulmonary:     Effort: Pulmonary effort is normal.     Breath sounds: Normal breath sounds.  Skin:    General: Skin is warm and dry.  Neurological:     General: No focal deficit present.     Mental Status: She is alert.     Gait: Gait normal.  Psychiatric:        Behavior: Behavior normal.        Assessment/Plan: 1. Dizziness (Primary) BP stable. Will update CT head and pt will also have follow up with neurology. May need ENT in future - CT HEAD WO CONTRAST ( ); Future  2. History of CVA (cerebrovascular accident) Will update CT and pt will follow up with neurology - CT HEAD WO CONTRAST ( ); Future  3. Right hand pain - AMB referral to orthopedics  4. Left hip pain - AMB referral to orthopedics  5. Left leg pain - AMB referral to orthopedics  6. Aortic atherosclerosis Continue statin and zetia   7. Renal atrophy, right Chronic  8. Stage 3b chronic kidney disease (HCC) Continue to monitor and  avoid NSAIDs  9. B12 deficiency - cyanocobalamin  (VITAMIN B12) injection 1,000 mcg   General Counseling: marlina cataldi understanding of the findings  of todays visit and agrees with plan of treatment. I have discussed any further diagnostic evaluation that may be needed or ordered today. We also reviewed her medications today. she has been encouraged to call the office with any questions or concerns that should arise related to todays visit.    Orders Placed This Encounter  Procedures   CT HEAD WO CONTRAST ( )   AMB referral to orthopedics    Meds ordered this encounter  Medications   cyanocobalamin  (VITAMIN B12) injection 1,000 mcg    This patient was seen by Tinnie Pro, PA-C in collaboration with Dr. Sigrid Bathe as a part of collaborative care agreement.   Total time spent:35 Minutes Time spent includes review of chart, medications, test results, and follow up plan with the patient.      Dr Fozia M Khan Internal medicine

## 2024-01-14 ENCOUNTER — Ambulatory Visit

## 2024-01-16 ENCOUNTER — Other Ambulatory Visit: Payer: Self-pay | Admitting: Physician Assistant

## 2024-01-16 DIAGNOSIS — F330 Major depressive disorder, recurrent, mild: Secondary | ICD-10-CM

## 2024-01-17 ENCOUNTER — Other Ambulatory Visit: Payer: Self-pay

## 2024-01-17 ENCOUNTER — Ambulatory Visit
Admission: RE | Admit: 2024-01-17 | Discharge: 2024-01-17 | Disposition: A | Discharge status: Home / Self Care | Source: Ambulatory Visit | Attending: Physician Assistant | Admitting: Physician Assistant

## 2024-01-17 DIAGNOSIS — I10 Essential (primary) hypertension: Secondary | ICD-10-CM

## 2024-01-17 DIAGNOSIS — R42 Dizziness and giddiness: Secondary | ICD-10-CM | POA: Insufficient documentation

## 2024-01-17 DIAGNOSIS — R9082 White matter disease, unspecified: Secondary | ICD-10-CM | POA: Diagnosis not present

## 2024-01-17 DIAGNOSIS — Z8673 Personal history of transient ischemic attack (TIA), and cerebral infarction without residual deficits: Secondary | ICD-10-CM | POA: Insufficient documentation

## 2024-01-17 DIAGNOSIS — G9389 Other specified disorders of brain: Secondary | ICD-10-CM | POA: Diagnosis not present

## 2024-01-17 MED ORDER — AMLODIPINE BESYLATE 5 MG PO TABS: 5.0000 mg | ORAL_TABLET | Freq: Every day | ORAL | 0 refills | Status: DC

## 2024-01-17 NOTE — Telephone Encounter (Signed)
 Pt called that cardiology is not feeling her med until she see them as per alyssa sent amlodipine 

## 2024-01-19 ENCOUNTER — Other Ambulatory Visit: Payer: Self-pay

## 2024-01-19 ENCOUNTER — Ambulatory Visit: Admission: RE | Admit: 2024-01-19 | Discharge: 2024-01-19 | Disposition: A | Discharge status: Home / Self Care

## 2024-01-19 ENCOUNTER — Ambulatory Visit
Admission: RE | Admit: 2024-01-19 | Discharge: 2024-01-19 | Disposition: A | Discharge status: Home / Self Care | Source: Ambulatory Visit

## 2024-01-19 ENCOUNTER — Ambulatory Visit (INDEPENDENT_AMBULATORY_CARE_PROVIDER_SITE_OTHER)

## 2024-01-19 DIAGNOSIS — M79641 Pain in right hand: Secondary | ICD-10-CM

## 2024-01-19 DIAGNOSIS — M7062 Trochanteric bursitis, left hip: Secondary | ICD-10-CM

## 2024-01-19 DIAGNOSIS — M65341 Trigger finger, right ring finger: Secondary | ICD-10-CM

## 2024-01-19 DIAGNOSIS — M1811 Unilateral primary osteoarthritis of first carpometacarpal joint, right hand: Secondary | ICD-10-CM | POA: Diagnosis not present

## 2024-01-19 DIAGNOSIS — M25552 Pain in left hip: Secondary | ICD-10-CM | POA: Insufficient documentation

## 2024-01-19 DIAGNOSIS — M51369 Other intervertebral disc degeneration, lumbar region without mention of lumbar back pain or lower extremity pain: Secondary | ICD-10-CM | POA: Diagnosis not present

## 2024-01-19 MED ORDER — TRIAMCINOLONE ACETONIDE 40 MG/ML IJ SUSP
40.0000 mg | INTRAMUSCULAR | Status: AC | PRN
  Administered 2024-01-19: 40 mg via INTRA_ARTICULAR

## 2024-01-19 MED ORDER — LIDOCAINE HCL 1 % IJ SOLN
1.0000 mL | INTRAMUSCULAR | Status: DC | PRN
  Administered 2024-01-19: 1 mL

## 2024-01-19 NOTE — Addendum Note (Signed)
 Addended by: VANDERBILT LIONEL CROME on: 01/19/2024 08:38 AM   Modules accepted: Orders

## 2024-01-19 NOTE — Patient Instructions (Signed)
 [facility] [address], [place] [address] [phone]   Thank you for visiting the office today. We appreciate your trust and allowing us  to help you with your orthopedic needs.  Please do not hesitate to call if you have further questions or concerns following your visit with us . If you experience life-threatening symptoms or it cannot wait until normal office hours, please go to the nearest Emergency Department for immediate evaluation.      Cortisone Injection Patient Information  -A cortisone injection has been recommended for you today during your office visit. The injection consists of two medications administered at the same time. The first medication is a numbing medication. This medication will help you to feel better today for a couple hours.  However, this medication will wear off today and your discomfort may return. The second medication is the steroid medication. This medication will usually take a few days for full effect.   -Some individuals, about 1 in 20, can have an increase in their pain after the injection for approximately 24-36 hours. This is called a steroid flare and may be associated with some slight flushing of the face. The best thing to do if this occurs is to ice the area as much as possible and take ibuprofen if you are able.   -Any patients who are currently receiving treatment for problems with their blood sugar should be aware that this medication may cause you to have elevated blood sugar. This will typically occur for 24-36 hours after the injection. We recommend that you check your blood sugar regularly during this time and contact your primary care provider immediately or go to the nearest emergency room if the levels get to high.

## 2024-01-19 NOTE — Progress Notes (Addendum)
 Orthopaedic Surgery New Patient Visit   History of Present Illness: The patient is a 73 y.o. female seen in clinic for left lateral hip pain and right hand pain.  She states her left lateral hip pain began about a month and a half ago without known injury or trauma.  She denies pain within the groin region.  Does admit to radiating pain down the lateral thigh which stops above the knee.  She notices the pain most when sleeping on the left side and when standing or walking.  She rates the pain as 8/10.  She has tried Tylenol  and short course of ibuprofen with only minimal relief of pain..  She does note that the pain wakes her up from sleep when she rolls onto her left side.  Her right hand pain is localized to the right ring finger palmar surface.  She noticed that the pain started about 2 weeks ago without a known injury or trauma.  She has never had the symptoms before in her right hand.  She also notes it is difficult to make a fist in the morning specifically because of the pain in the right ring finger.  She has not noticed any alleviating or provocative movements that correlate with the pain.  She rates the pain as 8/10.  Associated symptoms include stiffness, swelling and pain that wakes her up at night.  She has not tried any treatment for her finger other than massaging it.  Patient does have a medical history which includes a recent stroke that affected her right arm and leg.  However she has not suffered any lasting motor deficits as a result.  She does note some fatigue however.  Patient is unable to take nonsteroidal anti-inflammatories for long periods of time as she only has 1 functioning kidney.  Patient is not diabetic.  Patient does express a desire to be active in retirement.  However combination of physical and medical issues have limited her.  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   Past Surgical History:  Procedure Laterality Date   ABSCESS DRAINAGE  11/17/2013   buttock right   BREAST BIOPSY Right    neg   CATARACT EXTRACTION W/PHACO Right 08/17/2019   Procedure: CATARACT EXTRACTION PHACO AND INTRAOCULAR LENS PLACEMENT (IOC) RIGHT VISION BLUE 5.32  00:42.7;  Surgeon: Ferol Rogue, MD;  Location: [facility];  Service: Ophthalmology;  Laterality: Right;   CATARACT EXTRACTION W/PHACO Left 09/21/2019   Procedure: CATARACT EXTRACTION PHACO AND INTRAOCULAR LENS PLACEMENT (IOC) LEFT VISION BLUE;  Surgeon: Ferol Rogue, MD;  Location: [facility];  Service: Ophthalmology;  Laterality: Left;  6.24 0:40.6   CHOLECYSTECTOMY  2008   COLONOSCOPY  2014   [facility] [place]   COLONOSCOPY WITH PROPOFOL  N/A 08/26/2015   Procedure: COLONOSCOPY WITH PROPOFOL ;  Surgeon: Deward CINDERELLA Piedmont, MD;  Location: [facility];  Service: Gastroenterology;  Laterality: N/A;   COLONOSCOPY WITH PROPOFOL  N/A 03/03/2021   Procedure: COLONOSCOPY WITH PROPOFOL ;  Surgeon: Maryruth Ole DASEN, MD;  Location: [facility] END[facility];  Service: Endoscopy;  Laterality: N/A;   DILATION AND CURETTAGE OF UTERUS     ESOPHAGOGASTRODUODENOSCOPY N/A 11/15/2014   Procedure: ESOPHAGOGASTRODUODENOSCOPY (EGD);  Surgeon: Deward CINDERELLA Piedmont, MD;  Location: [facility];  Service: Gastroenterology;  Laterality: N/A;   ESOPHAGOGASTRODUODENOSCOPY (EGD) WITH PROPOFOL  N/A 03/03/2021    Procedure: ESOPHAGOGASTRODUODENOSCOPY (EGD) WITH PROPOFOL ;  Surgeon: Maryruth Ole DASEN, MD;  Location: [facility] ENDOSCOPY;  Service: Endoscopy;  Laterality: N/A;   EYE SURGERY     GASTROSTOMY     UPPER GI ENDOSCOPY  2014   [facility] in [place]   Allergies  Allergen Reactions   Lipitor [Atorvastatin ] Other (See Comments)    Muscle pain and stiffness   Bupropion Nausea And Vomiting   Penicillin V Potassium Other (See Comments)   Penicillins Hives   Current Outpatient Medications on File Prior to Visit  Medication Sig Dispense Refill   amLODipine  (NORVASC ) 5 MG tablet Take 1 tablet (5 mg total) by mouth daily. 90 tablet 0   aspirin  EC 81 MG tablet Take 1 tablet (81 mg total) by mouth daily. Swallow whole. 90 tablet 3   DULoxetine  (CYMBALTA ) 20 MG capsule TAKE 1 CAPSULE BY MOUTH TWICE A DAY 180 capsule 1   ezetimibe  (ZETIA ) 10 MG tablet TAKE 1 TABLET BY MOUTH EVERY DAY 90 tablet 1   famotidine  (PEPCID ) 40 MG tablet Take 40 mg by mouth daily.     hydrochlorothiazide  (HYDRODIURIL ) 12.5 MG tablet TAKE 1/2 TABLET BY MOUTH EVERY DAY 45 tablet 2   pantoprazole  (PROTONIX ) 40 MG tablet TAKE 1 TABLET BY MOUTH TWICE A DAY 180 tablet 1   rosuvastatin  (CRESTOR ) 10 MG tablet Take 1 tablet (10 mg total) by mouth daily. 90 tablet 3   No current facility-administered medications on file prior to visit.   Social History   Tobacco Use   Smoking status: Former    Current packs/day: 0.50    Average packs/day: 0.5 packs/day for 30.0 years (15.0 ttl pk-yrs)    Types: Cigarettes   Smokeless tobacco: Never   Tobacco comments:    Quit January 2024  Vaping Use   Vaping status: Never Used  Substance Use Topics   Alcohol use: No   Drug use: No      I have reviewed past medical, surgical, social and family history, medications and allergies as documented in the EMR.  Review of Systems - A ROS was performed including pertinent positives and negatives as documented in the HPI.     Physical  Exam:  General/Constitutional: NAD Vascular: No edema, swelling or tenderness, except as noted in detailed exam Integumentary: No impressive skin lesions present, except as noted in detailed exam Neuro/Psych: Normal mood and affect, oriented to person, place and time Musculoskeletal: Normal, except as noted in detailed exam and in HPI    Right Hand/Digit focused exam:  On gross examination of the right upper extremity the skin is intact.  Fingers warm and well perfused with 2+ radial pulse.  Sensation intact to the Median, Ulnar and Radial nerve distribution of the hand.    AIN/PIN/Ulnar nerve motor function intact.  Negative tinels at the cubital tunnel, negative tinels at the carpal tunnel.  Full flexion and extension of the fingers without active triggering.  Positive pain about the A1 pulley of the right ring finger.  Nontender about the dorsum of the right ring finger MCP joint.  No appreciable swelling or deformity about the MCP joint.  APB strength 5/5 with no signs of atrophy.    Negative for pain about the basilar thumb joint.   Negative CMC grind.  Able to make a composite fist.    Left hip focused exam: Gross examination of the left hip, skin is intact circumferentially.  There is no sign of deformity or ecchymosis.  Nontender to palpation about the groin region, lower lumbar spine, PSIS, iliac crest.  Tenderness to palpation about the piriformis.  Moderate to severe tenderness overlying the greater trochanter.  Nontender over the IT band distal to the greater trochanter.  Active and passive hip range of motion while seated and supine demonstrates smooth arc without pain.  Patient with active flexion past 100 degrees without pain in the groin.  Negative FABER.  Negative FADIR.  Negative Ober test.  Negative Stinchfield test.  Left lower extremity is warm and well-perfused.  Sensation is intact distally about the lower extremity.   XR pelvis/hip imaging: X-rays of the  pelvis and left hip including AP pelvis, AP and lateral of the left hip were obtained today at [facility] facility and were available for my review.  Per my interpretation, there are no fractures or dislocations.  Bilateral hip joint spaces well-maintained.  Degenerative changes noted about the lower lumbar spine.  X-ray right hand imaging: X-rays of the right hand including PA, oblique, lateral were obtained today at [facility] facility and were available for my review.  Per my interpretation, there are no fractures or dislocations.  Degenerative changes noted about the basal joint of the thumb    Assessment:  Left hip greater trochanteric bursitis Right ring trigger finger  Plan:  Patient was seen and examined in office today. We reviewed patient's history, examination, and imaging in detail. Based on information available for this encounter, patient likely symptomatic from her left hip greater trochanteric bursitis and right ring trigger finger.  In regards to her left hip, she has point tenderness over the greater trochanter and pain with laying on that side.  We discussed the natural course of this inflammatory process and treatment modalities.  She is unable to take nonsteroidal anti-inflammatories secondary to her atrophic kidney.  We have recommended a steroid injection for her greater trochanter bursa today.  She would like to proceed with the injection.  We have also provided a handout which demonstrates home exercises which may aid in symptom relief.  In regards to her right ring trigger finger, we discussed the natural course of this disease process and have recommended treatment options including a steroid injection.  She would like to proceed with this injection.   Patient education material was provided.  All questions, concerns and comments were addressed to the best of my ability.  Follow-up: 6 weeks or sooner if needed.   Arlyss GEANNIE Schneider, DO Orthopedic Surgery & Sports Medicine [facility]   Right ring trigger finger injection: The risks and benefits of a cortisone injection were discussed and the patient wishes to proceed.  The A1 pulley of the right ring digit was palpated.  The volar aspect of the palm was cleansed with alcohol swabs and ChloraPrep.  Using sterile technique, a 25 gauge needle was introduced into the volar space above the A1 pulley.  After aspiration revealed no blood the injectate which consisted of 1cc of 6 mg/mL of Betamethasone  and 1cc of 1% lidocaine  (w/o epi) was injected into the area.  The needle was withdrawn and pressure was applied for hemostasis.  A bandage was applied.  The patient tolerated the procedure well was told to ice the area tonight if it is sore.  Left hip greater trochanteric bursa injection: The risks and benefits of a cortisone injection were discussed and the patient wishes to proceed.  The greater trochanter of the left femur was palpated.  The skin overlying the greater trochanter at the point of maximum tenderness was cleansed with alcohol swabs and ChloraPrep.  Using sterile technique, a 21 gauge needle was introduced into the trochanteric bursal space above the greater trochanter.  After aspiration revealed no blood the injectate which consisted of 1cc of 40 mg/mL of Kenalog  and 4cc of 0.5% ropivacaine was injected into the area.  The needle was withdrawn and pressure was applied for hemostasis.  A bandage was applied.  The patient tolerated the procedure well was told to ice the area tonight if it is sore.  Large Joint Inj: L greater trochanter on 01/19/2024 2:36 PM Indications: pain Details: (20 g) 1.5 in needle, lateral approach Medications: 40 mg triamcinolone  acetonide 40 MG/ML (4 cc 0.5% Ropivacaine) Outcome: tolerated well, no immediate complications Procedure, treatment alternatives, risks and benefits explained, specific risks discussed. Patient was prepped and draped in the usual sterile fashion.    Hand/UE Inj: R  ring A1 for trigger finger on 01/19/2024 2:36 PM Indications: tendon swelling Details: 25 G needle, volar approach Medications: 1 mL lidocaine  1 % (Betamethasone  6mg /mL (1cc)) Outcome: tolerated well, no immediate complications Procedure, treatment alternatives, risks and benefits explained, specific risks discussed. Patient was prepped and draped in the usual sterile fashion.

## 2024-01-20 MED ORDER — LIDOCAINE HCL 1 % IJ SOLN
1.0000 mL | INTRAMUSCULAR | Status: AC | PRN
  Administered 2024-01-19: 1 mL

## 2024-01-21 ENCOUNTER — Telehealth: Payer: Self-pay

## 2024-01-21 NOTE — Telephone Encounter (Signed)
 Pt advised that ct scan is normal as per alyssa advised her as per DFK we can see her Tuesday and also try Flonase  OTC as per pt she is going to try Flonase  OTC and call us  back if need appt

## 2024-02-09 ENCOUNTER — Telehealth: Payer: Self-pay

## 2024-02-09 DIAGNOSIS — M25552 Pain in left hip: Secondary | ICD-10-CM

## 2024-02-09 DIAGNOSIS — M7062 Trochanteric bursitis, left hip: Secondary | ICD-10-CM

## 2024-02-09 NOTE — Telephone Encounter (Signed)
 Hi Shannon Li, we can try some physical therapy if she is interested. Sometimes learning exercises can aid in symptom control/relief.

## 2024-02-09 NOTE — Telephone Encounter (Signed)
 Tried calling pt back to discuss, no answer and no voice mail

## 2024-02-09 NOTE — Telephone Encounter (Signed)
 I talked to the pt. She was on the schedule for tomorrow for another troch injection. Last one was just done 1 month ago. I told her it was too soon. Please advise of next steps since she stated the pain has returned

## 2024-02-10 ENCOUNTER — Ambulatory Visit

## 2024-02-10 NOTE — Addendum Note (Signed)
 Addended by: VANDERBILT LIONEL CROME on: 02/10/2024 03:02 PM   Modules accepted: Orders

## 2024-02-10 NOTE — Telephone Encounter (Signed)
 I called and advised pt. She stated understanding. She wants me to order it but wants to talk to her pcp tomorrow 1st prior to scheduling any appointments. PT ordered

## 2024-02-11 ENCOUNTER — Ambulatory Visit

## 2024-02-11 ENCOUNTER — Ambulatory Visit: Admitting: Physician Assistant

## 2024-02-11 ENCOUNTER — Encounter: Payer: Self-pay | Admitting: Physician Assistant

## 2024-02-11 VITALS — BP 148/82 | HR 89 | Temp 98.3°F | Resp 16 | Ht 68.0 in | Wt 162.4 lb

## 2024-02-11 DIAGNOSIS — F411 Generalized anxiety disorder: Secondary | ICD-10-CM

## 2024-02-11 DIAGNOSIS — Z8673 Personal history of transient ischemic attack (TIA), and cerebral infarction without residual deficits: Secondary | ICD-10-CM

## 2024-02-11 DIAGNOSIS — F330 Major depressive disorder, recurrent, mild: Secondary | ICD-10-CM

## 2024-02-11 DIAGNOSIS — E538 Deficiency of other specified B group vitamins: Secondary | ICD-10-CM

## 2024-02-11 DIAGNOSIS — I10 Essential (primary) hypertension: Secondary | ICD-10-CM

## 2024-02-11 DIAGNOSIS — M7062 Trochanteric bursitis, left hip: Secondary | ICD-10-CM | POA: Diagnosis not present

## 2024-02-11 MED ORDER — DULOXETINE HCL 60 MG PO CPEP: 60.0000 mg | ORAL_CAPSULE | Freq: Every day | ORAL | 3 refills | Status: DC

## 2024-02-11 MED ORDER — CYANOCOBALAMIN 1000 MCG/ML IJ SOLN
1000.0000 ug | Freq: Once | INTRAMUSCULAR | Status: AC
  Administered 2024-02-11: 1000 ug via INTRAMUSCULAR

## 2024-02-11 NOTE — Progress Notes (Signed)
 [facility] [address] [place], [place] [address]  Internal MEDICINE  Office Visit Note  Patient Name: Shannon Li  [id number]  [id number]  Date of Service: 02/11/2024  Chief Complaint  Patient presents with   Follow-up    Review CT   Gastroesophageal Reflux   Hypertension    HPI Pt is here for routine follow up -saw ortho and dx with left trochanteric bursitis and trigger finger in right hand. Got steroid injection in both locations. States hand is much better. States hip better for 1 week but then came back and will be starting PT. States she really liked the physician she saw for this and that she was pleased with the visit -swimmy headed feeling is better, switched ASA to evening and seemed to help? Has not felt this for the past 3 days -CT head did not show any acute abnormality; expected evolution of previous infarct -Will increase cymbalta  to 60mg  but take once per day to see if this helps agitation and stress. Reports driving/traffic is a trigger which may be impacting BP today.   Current Medication: Outpatient Encounter Medications as of 02/11/2024  Medication Sig   amLODipine  (NORVASC ) 5 MG tablet Take 1 tablet (5 mg total) by mouth daily.   aspirin  EC 81 MG tablet Take 1 tablet (81 mg total) by mouth daily. Swallow whole.   DULoxetine  (CYMBALTA ) 20 MG capsule TAKE 1 CAPSULE BY MOUTH TWICE A DAY   ezetimibe  (ZETIA ) 10 MG tablet TAKE 1 TABLET BY MOUTH EVERY DAY   famotidine (PEPCID) 40 MG tablet Take 40 mg by mouth daily.   hydrochlorothiazide  (HYDRODIURIL ) 12.5 MG tablet TAKE 1/2 TABLET BY MOUTH EVERY DAY   rosuvastatin  (CRESTOR ) 10 MG tablet Take 1 tablet (10 mg total) by mouth daily.   [DISCONTINUED] pantoprazole  (PROTONIX ) 40 MG tablet TAKE 1 TABLET BY MOUTH TWICE A DAY   [DISCONTINUED] amLODipine  (NORVASC ) 5 MG tablet TAKE 1 TABLET (5 MG TOTAL) BY MOUTH DAILY.   [DISCONTINUED] DULoxetine  (CYMBALTA ) 20 MG capsule Take 1 capsule (20 mg total) by  mouth 2 (two) times daily.   [EXPIRED] cyanocobalamin  (VITAMIN B12) injection 1,000 mcg    No facility-administered encounter medications on file as of 02/11/2024.    Surgical History: Past Surgical History:  Procedure Laterality Date   ABSCESS DRAINAGE  11/17/2013   buttock right   BREAST BIOPSY Right    neg   CATARACT EXTRACTION W/PHACO Right 08/17/2019   Procedure: CATARACT EXTRACTION PHACO AND INTRAOCULAR LENS PLACEMENT (IOC) RIGHT VISION BLUE 5.32  00:42.7;  Surgeon: Ferol Rogue, MD;  Location: [facility];  Service: Ophthalmology;  Laterality: Right;   CATARACT EXTRACTION W/PHACO Left 09/21/2019   Procedure: CATARACT EXTRACTION PHACO AND INTRAOCULAR LENS PLACEMENT (IOC) LEFT VISION BLUE;  Surgeon: Ferol Rogue, MD;  Location: [facility];  Service: Ophthalmology;  Laterality: Left;  6.24 0:40.6   CHOLECYSTECTOMY  2008   COLONOSCOPY  2014   [facility] [place]   COLONOSCOPY WITH PROPOFOL  N/A 08/26/2015   Procedure: COLONOSCOPY WITH PROPOFOL ;  Surgeon: Deward CINDERELLA Piedmont, MD;  Location: [facility];  Service: Gastroenterology;  Laterality: N/A;   COLONOSCOPY WITH PROPOFOL  N/A 03/03/2021   Procedure: COLONOSCOPY WITH PROPOFOL ;  Surgeon: Maryruth Ole DASEN, MD;  Location: [facility] ENDOSCOPY;  Service: Endoscopy;  Laterality: N/A;   DILATION AND CURETTAGE OF UTERUS     ESOPHAGOGASTRODUODENOSCOPY N/A 11/15/2014   Procedure: ESOPHAGOGASTRODUODENOSCOPY (EGD);  Surgeon: Deward CINDERELLA Piedmont, MD;  Location: [facility] ENDOSCOPY;  Service: Gastroenterology;  Laterality:  N/A;   ESOPHAGOGASTRODUODENOSCOPY (EGD) WITH PROPOFOL  N/A 03/03/2021   Procedure: ESOPHAGOGASTRODUODENOSCOPY (EGD) WITH PROPOFOL ;  Surgeon: Maryruth Ole DASEN, MD;  Location: [facility] ENDOSCOPY;  Service: Endoscopy;  Laterality: N/A;   EYE SURGERY     GASTROSTOMY     UPPER GI ENDOSCOPY  2014   [facility] [place]    Medical History: Past Medical History:  Diagnosis Date   Anxiety    Arthritis    Atrophic  kidney    Chronic constipation    Colon polyp 2013   COPD (chronic obstructive pulmonary disease) (HCC)    Depression    Diverticulosis    Esophageal stricture    Esophagitis    Fibrocystic breast disease    GERD (gastroesophageal reflux disease)    History of hiatal hernia    Hypertension    IBS (irritable bowel syndrome)    Menopause    Ovarian cyst    Perirectal abscess    Plantar fasciitis    Postmenopausal    Renal artery occlusion    Renal insufficiency    Right kidney is small and not functioning.    Family History: Family History  Problem Relation Age of Onset   Heart disease Mother    Heart attack Mother    Diabetes Mother    Alzheimer's disease Father    Breast cancer Sister 30   Lung cancer Sister    Bladder Cancer Sister    Lung cancer Brother    Brain cancer Brother    Lung cancer Brother    Leukemia Brother    Diabetes Brother     Social History   Socioeconomic History   Marital status: Widowed    Spouse name: Not on file   Number of children: Not on file   Years of education: Not on file   Highest education level: Not on file  Occupational History   Not on file  Tobacco Use   Smoking status: Former    Current packs/day: 0.50    Average packs/day: 0.5 packs/day for 30.0 years (15.0 ttl pk-yrs)    Types: Cigarettes   Smokeless tobacco: Never   Tobacco comments:    Quit January 2024  Vaping Use   Vaping status: Never Used  Substance and Sexual Activity   Alcohol use: No   Drug use: No   Sexual activity: Not on file  Other Topics Concern   Not on file  Social History Narrative   Not on file   Social Drivers of Health   Financial Resource Strain: Low Risk  (08/18/2023)   Received from [facility]   Overall Financial Resource Strain (CARDIA)    Difficulty of Paying Living Expenses: Not very hard  Food Insecurity: No Food Insecurity (08/18/2023)   Received from [facility]   Hunger Vital Sign     Within the past 12 months, you worried that your food would run out before you got the money to buy more.: Never true    Within the past 12 months, the food you bought just didn't last and you didn't have money to get more.: Never true  Transportation Needs: No Transportation Needs (08/18/2023)   Received from [facility]    In the past 12 months, has lack of transportation kept you from medical appointments or from getting medications?: No    Lack of Transportation (Non-Medical): No  Physical Activity: Not on file  Stress: Not on file  Social Connections:  Patient Declined (05/08/2023)   Social Connection and Isolation Panel    Frequency of Communication with Friends and Family: Patient declined    Frequency of Social Gatherings with Friends and Family: Patient declined    Attends Religious Services: Patient declined    Database administrator or Organizations: Patient declined    Attends Banker Meetings: Patient declined    Marital Status: Patient declined  Intimate Partner Violence: Not At Risk (05/08/2023)   Humiliation, Afraid, Rape, and Kick questionnaire    Fear of Current or Ex-Partner: No    Emotionally Abused: No    Physically Abused: No    Sexually Abused: No      Review of Systems  Constitutional:  Positive for fatigue. Negative for chills and unexpected weight change.  HENT:  Positive for postnasal drip. Negative for congestion, rhinorrhea, sneezing and sore throat.   Eyes:  Negative for redness.  Respiratory:  Negative for cough, chest tightness and shortness of breath.   Cardiovascular:  Negative for chest pain and palpitations.  Gastrointestinal:  Negative for abdominal pain, constipation, diarrhea and vomiting.  Genitourinary:  Negative for dysuria and frequency.  Musculoskeletal:  Positive for arthralgias. Negative for joint swelling and neck pain.  Skin:  Negative for rash.  Neurological:  Negative for  tremors and light-headedness.  Hematological:  Negative for adenopathy. Does not bruise/bleed easily.  Psychiatric/Behavioral:  Negative for behavioral problems (Depression), sleep disturbance and suicidal ideas. The patient is not nervous/anxious.     Vital Signs: BP (!) 150/80   Pulse 89   Temp 98.3 F (36.8 C)   Resp 16   Ht 5' 8 (1.727 m)   Wt 162 lb 6.4 oz (73.7 kg)   SpO2 97%   BMI 24.69 kg/m    Physical Exam Vitals and nursing note reviewed.  Constitutional:      Appearance: Normal appearance.  HENT:     Head: Normocephalic and atraumatic.  Eyes:     Extraocular Movements: Extraocular movements intact.  Cardiovascular:     Rate and Rhythm: Normal rate and regular rhythm.  Pulmonary:     Effort: Pulmonary effort is normal.     Breath sounds: Normal breath sounds.  Skin:    General: Skin is warm and dry.  Neurological:     General: No focal deficit present.     Mental Status: She is alert.     Gait: Gait normal.  Psychiatric:        Behavior: Behavior normal.        Assessment/Plan: 1. Benign hypertension (Primary) Elevated in office likely due to frustration driving in traffic. Will monitor while increase cymbalta  which may help stress. Cautious with BP meds due to hx of orthostatic BP and dizziness and therefore will monitor on current meds for now  2. Mild episode of recurrent major depressive disorder Will increase cymbalta  to 60mg  daily rather than 20mg  BID - DULoxetine  (CYMBALTA ) 60 MG capsule; Take 1 capsule (60 mg total) by mouth daily.  Dispense: 30 capsule; Refill: 3  3. GAD (generalized anxiety disorder) Will increase cymbalta  to 60mg  daily rather than 20mg  BID - DULoxetine  (CYMBALTA ) 60 MG capsule; Take 1 capsule (60 mg total) by mouth daily.  Dispense: 30 capsule; Refill: 3  4. History of CVA (cerebrovascular accident) CT did not show any acute findings, but rather expected evolution of old infarct. Encouraged to follow up with  neurology  5. Trochanteric bursitis of left hip Followed by ortho and starting PT  6.  B12 deficiency - cyanocobalamin  (VITAMIN B12) injection 1,000 mcg   General Counseling: melvina pangelinan understanding of the findings of todays visit and agrees with plan of treatment. I have discussed any further diagnostic evaluation that may be needed or ordered today. We also reviewed her medications today. she has been encouraged to call the office with any questions or concerns that should arise related to todays visit.    No orders of the defined types were placed in this encounter.   Meds ordered this encounter  Medications   cyanocobalamin  (VITAMIN B12) injection 1,000 mcg    This patient was seen by Tinnie Pro, PA-C in collaboration with Dr. Sigrid Bathe as a part of collaborative care agreement.   Total time spent:30 Minutes Time spent includes review of chart, medications, test results, and follow up plan with the patient.      Dr Fozia M Khan Internal medicine

## 2024-02-21 ENCOUNTER — Encounter: Payer: Self-pay | Admitting: Radiology

## 2024-02-24 ENCOUNTER — Other Ambulatory Visit: Payer: Self-pay

## 2024-02-24 MED ORDER — AZITHROMYCIN 250 MG PO TABS: ORAL_TABLET | ORAL | 0 refills | Status: AC

## 2024-03-10 ENCOUNTER — Ambulatory Visit (INDEPENDENT_AMBULATORY_CARE_PROVIDER_SITE_OTHER): Admitting: Physician Assistant

## 2024-03-10 ENCOUNTER — Encounter: Payer: Self-pay | Admitting: Physician Assistant

## 2024-03-10 VITALS — BP 128/80 | HR 91 | Temp 98.0°F | Resp 16 | Ht 68.0 in | Wt 164.0 lb

## 2024-03-10 DIAGNOSIS — Z8673 Personal history of transient ischemic attack (TIA), and cerebral infarction without residual deficits: Secondary | ICD-10-CM

## 2024-03-10 DIAGNOSIS — R42 Dizziness and giddiness: Secondary | ICD-10-CM | POA: Diagnosis not present

## 2024-03-10 DIAGNOSIS — F411 Generalized anxiety disorder: Secondary | ICD-10-CM | POA: Diagnosis not present

## 2024-03-10 DIAGNOSIS — F330 Major depressive disorder, recurrent, mild: Secondary | ICD-10-CM

## 2024-03-10 DIAGNOSIS — I10 Essential (primary) hypertension: Secondary | ICD-10-CM

## 2024-03-10 MED ORDER — BUSPIRONE HCL 5 MG PO TABS: 5.0000 mg | ORAL_TABLET | Freq: Two times a day (BID) | ORAL | 1 refills | Status: DC | PRN

## 2024-03-10 NOTE — Progress Notes (Signed)
 [facility] [address] [place], [place] [address]  Internal MEDICINE  Office Visit Note  Patient Name: Shannon Li  [id number]  [id number]  Date of Service: 03/10/2024  Chief Complaint  Patient presents with   Follow-up   Hypertension    HPI Pt is here for routine follow up -Continues to have some vision changes-- can't see far away as well, already has glasses for distance that she wears when driving that work well, but may need re-evaluation. Will schedule follow up -she cancelled neurology follow up, but advised to reschedule -still has things getting on nerves quickly, increased cymbalta  did not help this. Would like something as needed, but not a controlled substance that will make her too sleepy -sleep varies, gets some muscle cramps despite drinking enough water . May try magnesium  to help  Current Medication: Outpatient Encounter Medications as of 03/10/2024  Medication Sig   amLODipine  (NORVASC ) 5 MG tablet Take 1 tablet (5 mg total) by mouth daily.   aspirin  EC 81 MG tablet Take 1 tablet (81 mg total) by mouth daily. Swallow whole.   busPIRone  (BUSPAR ) 5 MG tablet Take 1 tablet (5 mg total) by mouth 2 (two) times daily as needed.   DULoxetine  (CYMBALTA ) 60 MG capsule Take 1 capsule (60 mg total) by mouth daily.   ezetimibe  (ZETIA ) 10 MG tablet TAKE 1 TABLET BY MOUTH EVERY DAY   famotidine (PEPCID) 40 MG tablet Take 40 mg by mouth daily.   hydrochlorothiazide  (HYDRODIURIL ) 12.5 MG tablet TAKE 1/2 TABLET BY MOUTH EVERY DAY   rosuvastatin  (CRESTOR ) 10 MG tablet Take 1 tablet (10 mg total) by mouth daily.   No facility-administered encounter medications on file as of 03/10/2024.    Surgical History: Past Surgical History:  Procedure Laterality Date   ABSCESS DRAINAGE  11/17/2013   buttock right   BREAST BIOPSY Right    neg   CATARACT EXTRACTION W/PHACO Right 08/17/2019   Procedure: CATARACT EXTRACTION PHACO AND INTRAOCULAR LENS PLACEMENT (IOC)  RIGHT VISION BLUE 5.32  00:42.7;  Surgeon: Ferol Rogue, MD;  Location: [facility];  Service: Ophthalmology;  Laterality: Right;   CATARACT EXTRACTION W/PHACO Left 09/21/2019   Procedure: CATARACT EXTRACTION PHACO AND INTRAOCULAR LENS PLACEMENT (IOC) LEFT VISION BLUE;  Surgeon: Ferol Rogue, MD;  Location: [facility];  Service: Ophthalmology;  Laterality: Left;  6.24 0:40.6   CHOLECYSTECTOMY  2008   COLONOSCOPY  2014   [facility] in [place]   COLONOSCOPY WITH PROPOFOL  N/A 08/26/2015   Procedure: COLONOSCOPY WITH PROPOFOL ;  Surgeon: Deward CINDERELLA Piedmont, MD;  Location: [facility] END[facility]COPY;  Service: Gastroenterology;  Laterality: N/A;   COLONOSCOPY WITH PROPOFOL  N/A 03/03/2021   Procedure: COLONOSCOPY WITH PROPOFOL ;  Surgeon: Maryruth Ole DASEN, MD;  Location: [facility] ENDOSCOPY;  Service: Endoscopy;  Laterality: N/A;   DILATION AND CURETTAGE OF UTERUS     ESOPHAGOGASTRODUODENOSCOPY N/A 11/15/2014   Procedure: ESOPHAGOGASTRODUODENOSCOPY (EGD);  Surgeon: Deward CINDERELLA Piedmont, MD;  Location[facility];  Service: Gastroenterology;  Laterality: N/A;   ESOPHAGOGASTRODUODENOSCOPY (EGD) WITH PROPOFOL  N/A 03/03/2021   Procedure: ESOPHAGOGASTRODUODENOSCOPY (EGD) WITH PROPOFOL ;  Surgeon: Maryruth Ole DASEN, MD;  Location: [facility] ENDOSCOPY;  Service: Endoscopy;  Laterality: N/A;   EYE SURGERY     GASTROSTOMY     UPPER GI ENDOSCOPY  2014   [facility] in [place]    Medical History: Past Medical History:  Diagnosis Date   Anxiety    Arthritis    Atrophic kidney    Chronic constipation  Colon polyp 2013   COPD (chronic obstructive pulmonary disease) (HCC)    Depression    Diverticulosis    Esophageal stricture    Esophagitis    Fibrocystic breast disease    GERD (gastroesophageal reflux disease)    History of hiatal hernia    Hypertension    IBS (irritable bowel syndrome)    Menopause    Ovarian cyst    Perirectal abscess    Plantar fasciitis    Postmenopausal    Renal artery  occlusion    Renal insufficiency    Right kidney is small and not functioning.    Family History: Family History  Problem Relation Age of Onset   Heart disease Mother    Heart attack Mother    Diabetes Mother    Alzheimer's disease Father    Breast cancer Sister 36   Lung cancer Sister    Bladder Cancer Sister    Lung cancer Brother    Brain cancer Brother    Lung cancer Brother    Leukemia Brother    Diabetes Brother     Social History   Socioeconomic History   Marital status: Widowed    Spouse name: Not on file   Number of children: Not on file   Years of education: Not on file   Highest education level: Not on file  Occupational History   Not on file  Tobacco Use   Smoking status: Former    Current packs/day: 0.50    Average packs/day: 0.5 packs/day for 30.0 years (15.0 ttl pk-yrs)    Types: Cigarettes   Smokeless tobacco: Never   Tobacco comments:    Quit January 2024  Vaping Use   Vaping status: Never Used  Substance and Sexual Activity   Alcohol use: No   Drug use: No   Sexual activity: Not on file  Other Topics Concern   Not on file  Social History Narrative   Not on file   Social Drivers of Health   Financial Resource Strain: Low Risk  (08/18/2023)   Received from [facility]   Overall Financial Resource Strain (CARDIA)    Difficulty of Paying Living Expenses: Not very hard  Food Insecurity: No Food Insecurity (08/18/2023)   Received [facility]   Hunger Vital Sign    Within the past 12 months, you worried that your food would run out before you got the money to buy more.: Never true    Within the past 12 months, the food you bought just didn't last and you didn't have money to get more.: Never true  Transportation Needs: No Transportation Needs (08/18/2023)   Received from [facility] Transportation    In the past 12 months, has lack of transportation kept you from medical  appointments or from getting medications?: No    Lack of Transportation (Non-Medical): No  Physical Activity: Not on file  Stress: Not on file  Social Connections: Patient Declined (05/08/2023)   Social Connection and Isolation Panel    Frequency of Communication with Friends and Family: Patient declined    Frequency of Social Gatherings with Friends and Family: Patient declined    Attends Religious Services: Patient declined    Database Administrator or Organizations: Patient declined    Attends Banker Meetings: Patient declined    Marital Status: Patient declined  Intimate Partner Violence: Not At Risk (05/08/2023)   Humiliation, Afraid, Rape, and Kick questionnaire  Fear of Current or Ex-Partner: No    Emotionally Abused: No    Physically Abused: No    Sexually Abused: No      Review of Systems  Constitutional:  Positive for fatigue. Negative for chills and unexpected weight change.  HENT:  Positive for postnasal drip. Negative for congestion, rhinorrhea, sneezing and sore throat.   Eyes:  Positive for visual disturbance. Negative for redness.  Respiratory:  Negative for cough, chest tightness and shortness of breath.   Cardiovascular:  Negative for chest pain and palpitations.  Gastrointestinal:  Negative for abdominal pain, constipation, diarrhea and vomiting.  Genitourinary:  Negative for dysuria and frequency.  Musculoskeletal:  Positive for arthralgias. Negative for joint swelling and neck pain.  Skin:  Negative for rash.  Neurological:  Positive for light-headedness. Negative for tremors.  Hematological:  Negative for adenopathy. Does not bruise/bleed easily.  Psychiatric/Behavioral:  Positive for sleep disturbance. Negative for behavioral problems (Depression) and suicidal ideas. The patient is nervous/anxious.     Vital Signs: BP 128/80   Pulse 91   Temp 98 F (36.7 C)   Resp 16   Ht 5' 8 (1.727 m)   Wt 164 lb (74.4 kg)   SpO2 96%   BMI 24.94  kg/m    Physical Exam Vitals and nursing note reviewed.  Constitutional:      Appearance: Normal appearance.  HENT:     Head: Normocephalic and atraumatic.  Eyes:     Extraocular Movements: Extraocular movements intact.  Cardiovascular:     Rate and Rhythm: Normal rate and regular rhythm.  Pulmonary:     Effort: Pulmonary effort is normal.     Breath sounds: Normal breath sounds.  Skin:    General: Skin is warm and dry.  Neurological:     General: No focal deficit present.     Mental Status: She is alert.     Gait: Gait normal.  Psychiatric:        Behavior: Behavior normal.        Thought Content: Thought content normal.        Assessment/Plan: 1. Benign hypertension (Primary) Stable, continue current medication  2. GAD (generalized anxiety disorder) Will add buspar  prn and continue cymbalta  daily - busPIRone  (BUSPAR ) 5 MG tablet; Take 1 tablet (5 mg total) by mouth 2 (two) times daily as needed.  Dispense: 60 tablet; Refill: 1  3. Mild episode of recurrent major depressive disorder Continue cymbalta   4. History of CVA (cerebrovascular accident) Encouraged to follow up with neurology  5. Dizziness Ongoing intermittent dizziness since stroke, repeat CT done a few months ago. Not worsening. Advised to follow up with neurology   General Counseling: keary hanak understanding of the findings of todays visit and agrees with plan of treatment. I have discussed any further diagnostic evaluation that may be needed or ordered today. We also reviewed her medications today. she has been encouraged to call the office with any questions or concerns that should arise related to todays visit.    No orders of the defined types were placed in this encounter.   Meds ordered this encounter  Medications   busPIRone  (BUSPAR ) 5 MG tablet    Sig: Take 1 tablet (5 mg total) by mouth 2 (two) times daily as needed.    Dispense:  60 tablet    Refill:  1    This patient  was seen by Tinnie Pro, PA-C in collaboration with Dr. Sigrid Bathe as a part of collaborative care agreement.  Total time spent:30 Minutes Time spent includes review of chart, medications, test results, and follow up plan with the patient.      Dr Fozia M Khan Internal medicine

## 2024-03-12 ENCOUNTER — Other Ambulatory Visit: Payer: Self-pay | Admitting: Physician Assistant

## 2024-03-12 DIAGNOSIS — F411 Generalized anxiety disorder: Secondary | ICD-10-CM

## 2024-03-12 DIAGNOSIS — F330 Major depressive disorder, recurrent, mild: Secondary | ICD-10-CM

## 2024-03-14 DIAGNOSIS — D485 Neoplasm of uncertain behavior of skin: Secondary | ICD-10-CM | POA: Diagnosis not present

## 2024-03-14 DIAGNOSIS — D2262 Melanocytic nevi of left upper limb, including shoulder: Secondary | ICD-10-CM | POA: Diagnosis not present

## 2024-03-14 DIAGNOSIS — L570 Actinic keratosis: Secondary | ICD-10-CM | POA: Diagnosis not present

## 2024-03-14 DIAGNOSIS — D0461 Carcinoma in situ of skin of right upper limb, including shoulder: Secondary | ICD-10-CM | POA: Diagnosis not present

## 2024-03-14 DIAGNOSIS — D2261 Melanocytic nevi of right upper limb, including shoulder: Secondary | ICD-10-CM | POA: Diagnosis not present

## 2024-03-14 DIAGNOSIS — R413 Other amnesia: Secondary | ICD-10-CM | POA: Diagnosis not present

## 2024-03-14 DIAGNOSIS — L821 Other seborrheic keratosis: Secondary | ICD-10-CM | POA: Diagnosis not present

## 2024-03-14 DIAGNOSIS — Z85828 Personal history of other malignant neoplasm of skin: Secondary | ICD-10-CM | POA: Diagnosis not present

## 2024-03-14 DIAGNOSIS — D2271 Melanocytic nevi of right lower limb, including hip: Secondary | ICD-10-CM | POA: Diagnosis not present

## 2024-03-14 DIAGNOSIS — Z08 Encounter for follow-up examination after completed treatment for malignant neoplasm: Secondary | ICD-10-CM | POA: Diagnosis not present

## 2024-03-14 DIAGNOSIS — D225 Melanocytic nevi of trunk: Secondary | ICD-10-CM | POA: Diagnosis not present

## 2024-03-14 DIAGNOSIS — D2272 Melanocytic nevi of left lower limb, including hip: Secondary | ICD-10-CM | POA: Diagnosis not present

## 2024-03-15 ENCOUNTER — Ambulatory Visit

## 2024-03-17 ENCOUNTER — Ambulatory Visit

## 2024-04-02 ENCOUNTER — Other Ambulatory Visit: Payer: Self-pay | Admitting: Physician Assistant

## 2024-04-02 DIAGNOSIS — F411 Generalized anxiety disorder: Secondary | ICD-10-CM

## 2024-04-10 ENCOUNTER — Other Ambulatory Visit: Payer: Self-pay | Admitting: Physician Assistant

## 2024-04-10 DIAGNOSIS — J0100 Acute maxillary sinusitis, unspecified: Secondary | ICD-10-CM

## 2024-04-17 ENCOUNTER — Other Ambulatory Visit: Payer: Self-pay

## 2024-04-17 ENCOUNTER — Telehealth: Payer: Self-pay

## 2024-04-17 DIAGNOSIS — I10 Essential (primary) hypertension: Secondary | ICD-10-CM

## 2024-04-17 MED ORDER — FAMOTIDINE 40 MG PO TABS: 40.0000 mg | ORAL_TABLET | Freq: Every day | ORAL | 0 refills | Status: AC

## 2024-04-17 MED ORDER — AMLODIPINE BESYLATE 5 MG PO TABS: 5.0000 mg | ORAL_TABLET | Freq: Every day | ORAL | 0 refills | Status: DC

## 2024-04-17 NOTE — Telephone Encounter (Signed)
 Spoke  with that as per dr fernand sent famotidine   refills she getting this medication by other MD

## 2024-05-02 ENCOUNTER — Telehealth: Payer: Self-pay

## 2024-05-02 ENCOUNTER — Other Ambulatory Visit: Payer: Self-pay | Admitting: Physician Assistant

## 2024-05-02 DIAGNOSIS — F411 Generalized anxiety disorder: Secondary | ICD-10-CM

## 2024-05-02 NOTE — Telephone Encounter (Signed)
 Pt called for jury duty letter its ready for lauren to sign

## 2024-05-04 ENCOUNTER — Telehealth: Payer: Self-pay

## 2024-05-04 NOTE — Telephone Encounter (Signed)
 Pt advised jury letter ready for pickup

## 2024-05-06 ENCOUNTER — Other Ambulatory Visit: Payer: Self-pay | Admitting: Physician Assistant

## 2024-05-06 DIAGNOSIS — F411 Generalized anxiety disorder: Secondary | ICD-10-CM

## 2024-05-08 ENCOUNTER — Ambulatory Visit (INDEPENDENT_AMBULATORY_CARE_PROVIDER_SITE_OTHER): Admitting: Physician Assistant

## 2024-05-08 VITALS — BP 136/86 | HR 68 | Temp 97.8°F | Resp 16 | Ht 68.0 in | Wt 166.0 lb

## 2024-05-08 DIAGNOSIS — I10 Essential (primary) hypertension: Secondary | ICD-10-CM

## 2024-05-08 DIAGNOSIS — R42 Dizziness and giddiness: Secondary | ICD-10-CM | POA: Diagnosis not present

## 2024-05-08 DIAGNOSIS — E782 Mixed hyperlipidemia: Secondary | ICD-10-CM | POA: Diagnosis not present

## 2024-05-08 DIAGNOSIS — I700 Atherosclerosis of aorta: Secondary | ICD-10-CM

## 2024-05-08 DIAGNOSIS — F411 Generalized anxiety disorder: Secondary | ICD-10-CM

## 2024-05-08 DIAGNOSIS — M7989 Other specified soft tissue disorders: Secondary | ICD-10-CM | POA: Diagnosis not present

## 2024-05-08 MED ORDER — HYDROCHLOROTHIAZIDE 12.5 MG PO TABS: 6.2500 mg | ORAL_TABLET | Freq: Every day | ORAL | 2 refills | Status: AC

## 2024-05-08 MED ORDER — AMLODIPINE BESYLATE 5 MG PO TABS: 5.0000 mg | ORAL_TABLET | Freq: Every day | ORAL | 1 refills | Status: AC

## 2024-05-08 MED ORDER — EZETIMIBE 10 MG PO TABS: 10.0000 mg | ORAL_TABLET | Freq: Every day | ORAL | 1 refills | Status: AC

## 2024-05-08 NOTE — Progress Notes (Signed)
 [facility] [address] [place], [place] [address]  Internal MEDICINE  Office Visit Note  Patient Name: Shannon Li  [id number]  [id number]  Date of Service: 05/11/2024  Chief Complaint  Patient presents with   Follow-up    HPI Pt is here for routine follow up -taking Buspar  twice per day, will send refills  -does still get dizziness/swimmy headed many mornings, feels like head is big, this is intermittent and only in AM typically. Has been going on for many months, perhaps since stroke, and CT head done several months ago. She is addressing further with neurology -BP stable  Current Medication: Outpatient Encounter Medications as of 05/08/2024  Medication Sig   aspirin  EC 81 MG tablet Take 1 tablet (81 mg total) by mouth daily. Swallow whole.   DULoxetine  (CYMBALTA ) 60 MG capsule TAKE 1 CAPSULE BY MOUTH EVERY DAY   famotidine  (PEPCID ) 40 MG tablet Take 1 tablet (40 mg total) by mouth daily.   rosuvastatin  (CRESTOR ) 10 MG tablet Take 1 tablet (10 mg total) by mouth daily.   [DISCONTINUED] amLODipine  (NORVASC ) 5 MG tablet Take 1 tablet (5 mg total) by mouth daily.   [DISCONTINUED] busPIRone  (BUSPAR ) 5 MG tablet Take 1 tablet (5 mg total) by mouth 2 (two) times daily as needed.   [DISCONTINUED] ezetimibe  (ZETIA ) 10 MG tablet TAKE 1 TABLET BY MOUTH EVERY DAY   [DISCONTINUED] hydrochlorothiazide  (HYDRODIURIL ) 12.5 MG tablet TAKE 1/2 TABLET BY MOUTH EVERY DAY   amLODipine  (NORVASC ) 5 MG tablet Take 1 tablet (5 mg total) by mouth daily.   ezetimibe  (ZETIA ) 10 MG tablet Take 1 tablet (10 mg total) by mouth daily.   hydrochlorothiazide  (HYDRODIURIL ) 12.5 MG tablet Take 0.5 tablets (6.25 mg total) by mouth daily.   No facility-administered encounter medications on file as of 05/08/2024.    Surgical History: Past Surgical History:  Procedure Laterality Date   ABSCESS DRAINAGE  11/17/2013   buttock right   BREAST BIOPSY Right    neg   CATARACT EXTRACTION W/PHACO  Right 08/17/2019   Procedure: CATARACT EXTRACTION PHACO AND INTRAOCULAR LENS PLACEMENT (IOC) RIGHT VISION BLUE 5.32  00:42.7;  Surgeon: Ferol Rogue, MD;  Location: [facility];  Service: Ophthalmology;  Laterality: Right;   CATARACT EXTRACTION W/PHACO Left 09/21/2019   Procedure: CATARACT EXTRACTION PHACO AND INTRAOCULAR LENS PLACEMENT (IOC) LEFT VISION BLUE;  Surgeon: Ferol Rogue, MD;  Location: [facility];  Service: Ophthalmology;  Laterality: Left;  6.24 0:40.6   CHOLECYSTECTOMY  2008   COLONOSCOPY  2014   [facility] in [place]   COLONOSCOPY WITH PROPOFOL  N/A 08/26/2015   Procedure: COLONOSCOPY WITH PROPOFOL ;  Surgeon: Deward CINDERELLA Piedmont, MD;  Location: [facility] ENDOSCOPY;  Service: Gastroenterology;  Laterality: N/A;   COLONOSCOPY WITH PROPOFOL  N/A 03/03/2021   Procedure: COLONOSCOPY WITH PROPOFOL ;  Surgeon: Maryruth Ole DASEN, MD;  Location: [facility] ENDOSCOPY;  Service: Endoscopy;  Laterality: N/A;   DILATION AND CURETTAGE OF UTERUS     ESOPHAGOGASTRODUODENOSCOPY N/A 11/15/2014   Procedure: ESOPHAGOGASTRODUODENOSCOPY (EGD);  Surgeon: Deward CINDERELLA Piedmont, MD;  Location: [facility];  Service: Gastroenterology;  Laterality: N/A;   ESOPHAGOGASTRODUODENOSCOPY (EGD) WITH PROPOFOL  N/A 03/03/2021   Procedure: ESOPHAGOGASTRODUODENOSCOPY (EGD) WITH PROPOFOL ;  Surgeon: Maryruth Ole DASEN, MD;  Location: [facility] ENDOSCOPY;  Service: Endoscopy;  Laterality: N/A;   EYE SURGERY     GASTROSTOMY     UPPER GI ENDOSCOPY  2014   [facility] in [place]    Medical History: Past Medical History:  Diagnosis Date  Anxiety    Arthritis    Atrophic kidney    Chronic constipation    Colon polyp 2013   COPD (chronic obstructive pulmonary disease) (HCC)    Depression    Diverticulosis    Esophageal stricture    Esophagitis    Fibrocystic breast disease    GERD (gastroesophageal reflux disease)    History of hiatal hernia    Hypertension    IBS (irritable bowel syndrome)    Menopause     Ovarian cyst    Perirectal abscess    Plantar fasciitis    Postmenopausal    Renal artery occlusion    Renal insufficiency    Right kidney is small and not functioning.    Family History: Family History  Problem Relation Age of Onset   Heart disease Mother    Heart attack Mother    Diabetes Mother    Alzheimer's disease Father    Breast cancer Sister 66   Lung cancer Sister    Bladder Cancer Sister    Lung cancer Brother    Brain cancer Brother    Lung cancer Brother    Leukemia Brother    Diabetes Brother     Social History   Socioeconomic History   Marital status: Widowed    Spouse name: Not on file   Number of children: Not on file   Years of education: Not on file   Highest education level: Not on file  Occupational History   Not on file  Tobacco Use   Smoking status: Former    Current packs/day: 0.50    Average packs/day: 0.5 packs/day for 30.0 years (15.0 ttl pk-yrs)    Types: Cigarettes   Smokeless tobacco: Never   Tobacco comments:    Quit January 2024  Vaping Use   Vaping status: Never Used  Substance and Sexual Activity   Alcohol use: No   Drug use: No   Sexual activity: Not on file  Other Topics Concern   Not on file  Social History Narrative   Not on file   Social Drivers of Health   Tobacco Use: High Risk (03/15/2024)   Received from [facility]   Patient History    Smoking Tobacco Use: Every Day    Smokeless Tobacco Use: Never    Passive Exposure: Not on file  Financial Resource Strain: Low Risk  (08/18/2023)   Received from [facility]   Overall Financial Resource Strain (CARDIA)    Difficulty of Paying Living Expenses: Not very hard  Food Insecurity: No Food Insecurity (08/18/2023)   Received from [facility]   Epic    Within the past 12 months, you worried that your food would run out before you got the money to buy more.: Never true    Within the past 12 months, the food you  bought just didn't last and you didn't have money to get more.: Never true  Transportation Needs: No Transportation Needs (08/18/2023)   Received from [facility] - Transportation    In the past 12 months, has lack of transportation kept you from medical appointments or from getting medications?: No    Lack of Transportation (Non-Medical): No  Physical Activity: Not on file  Stress: Not on file  Social Connections: Patient Declined (05/08/2023)   Social Connection and Isolation Panel    Frequency of Communication with Friends and Family: Patient declined    Frequency of Social Gatherings with  Friends and Family: Patient declined    Attends Religious Services: Patient declined    Active Member of Clubs or Organizations: Patient declined    Attends Banker Meetings: Patient declined    Marital Status: Patient declined  Intimate Partner Violence: Not At Risk (05/08/2023)   Humiliation, Afraid, Rape, and Kick questionnaire    Fear of Current or Ex-Partner: No    Emotionally Abused: No    Physically Abused: No    Sexually Abused: No  Depression (PHQ2-9): Low Risk (05/08/2024)   Depression (PHQ2-9)    PHQ-2 Score: 0  Alcohol Screen: Not on file  Housing: Low Risk  (08/18/2023)   Received from [facility]   Epic    In the last 12 months, was there a time when you were not able to pay the mortgage or rent on time?: No    In the past 12 months, how many times have you moved where you were living?: 0    At any time in the past 12 months, were you homeless or living in a shelter (including now)?: No  Utilities: Not At Risk (08/18/2023)   Received from [facility] Utilities    Threatened with loss of utilities: No  Health Literacy: Not on file      Review of Systems  Constitutional:  Positive for fatigue. Negative for chills and unexpected weight change.  HENT:  Positive for postnasal drip. Negative for  congestion, rhinorrhea, sneezing and sore throat.   Eyes:  Negative for redness.  Respiratory:  Negative for cough, chest tightness and shortness of breath.   Cardiovascular:  Negative for chest pain and palpitations.  Gastrointestinal:  Negative for abdominal pain, constipation, diarrhea and vomiting.  Genitourinary:  Negative for dysuria and frequency.  Musculoskeletal:  Positive for arthralgias. Negative for joint swelling and neck pain.  Skin:  Negative for rash.  Neurological:  Positive for light-headedness. Negative for tremors.  Hematological:  Negative for adenopathy. Does not bruise/bleed easily.  Psychiatric/Behavioral:  Negative for behavioral problems (Depression), sleep disturbance and suicidal ideas. The patient is not nervous/anxious.     Vital Signs: BP 136/86 Comment: 140/84  Pulse 68   Temp 97.8 F (36.6 C)   Resp 16   Ht 5' 8 (1.727 m)   Wt 166 lb (75.3 kg)   SpO2 97%   BMI 25.24 kg/m    Physical Exam Vitals and nursing note reviewed.  Constitutional:      Appearance: Normal appearance.  HENT:     Head: Normocephalic and atraumatic.  Eyes:     Extraocular Movements: Extraocular movements intact.  Cardiovascular:     Rate and Rhythm: Normal rate and regular rhythm.  Pulmonary:     Effort: Pulmonary effort is normal.     Breath sounds: Normal breath sounds.  Skin:    General: Skin is warm and dry.  Neurological:     General: No focal deficit present.     Mental Status: She is alert.     Gait: Gait normal.  Psychiatric:        Behavior: Behavior normal.        Thought Content: Thought content normal.        Assessment/Plan: 1. Benign hypertension (Primary) Stable, continue current medications - amLODipine  (NORVASC ) 5 MG tablet; Take 1 tablet (5 mg total) by mouth daily.  Dispense: 90 tablet; Refill: 1  2. Dizziness Persistent most morning for months/post-CVA. Advised on drinking water  first thing in  AM to help hydration and to change  position slowly. Will order carotid US  and pt also following up with neurology - US  Carotid Duplex Bilateral; Future  3. Aortic atherosclerosis Will check carotid US  for further evaluation - US  Carotid Duplex Bilateral; Future  4. Mixed hyperlipidemia - ezetimibe  (ZETIA ) 10 MG tablet; Take 1 tablet (10 mg total) by mouth daily.  Dispense: 90 tablet; Refill: 1  5. Localized swelling of both lower extremities - hydrochlorothiazide  (HYDRODIURIL ) 12.5 MG tablet; Take 0.5 tablets (6.25 mg total) by mouth daily.  Dispense: 45 tablet; Refill: 2  6. GAD (generalized anxiety disorder) Improving some, continue buspar    General Counseling: amia rynders understanding of the findings of todays visit and agrees with plan of treatment. I have discussed any further diagnostic evaluation that may be needed or ordered today. We also reviewed her medications today. she has been encouraged to call the office with any questions or concerns that should arise related to todays visit.    Orders Placed This Encounter  Procedures   US  Carotid Duplex Bilateral    Meds ordered this encounter  Medications   amLODipine  (NORVASC ) 5 MG tablet    Sig: Take 1 tablet (5 mg total) by mouth daily.    Dispense:  90 tablet    Refill:  1   ezetimibe  (ZETIA ) 10 MG tablet    Sig: Take 1 tablet (10 mg total) by mouth daily.    Dispense:  90 tablet    Refill:  1   hydrochlorothiazide  (HYDRODIURIL ) 12.5 MG tablet    Sig: Take 0.5 tablets (6.25 mg total) by mouth daily.    Dispense:  45 tablet    Refill:  2    This patient was seen by Tinnie Pro, PA-C in collaboration with Dr. Sigrid Bathe as a part of collaborative care agreement.   Total time spent:30 Minutes Time spent includes review of chart, medications, test results, and follow up plan with the patient.      Dr Fozia M Khan Internal medicine

## 2024-05-12 ENCOUNTER — Ambulatory Visit
Admission: RE | Admit: 2024-05-12 | Discharge: 2024-05-12 | Disposition: A | Discharge status: Home / Self Care | Source: Ambulatory Visit | Attending: Physician Assistant | Admitting: Physician Assistant

## 2024-05-12 DIAGNOSIS — R42 Dizziness and giddiness: Secondary | ICD-10-CM | POA: Diagnosis present

## 2024-05-12 DIAGNOSIS — I700 Atherosclerosis of aorta: Secondary | ICD-10-CM | POA: Insufficient documentation

## 2024-05-26 ENCOUNTER — Ambulatory Visit: Payer: Self-pay | Admitting: Physician Assistant

## 2024-10-26 ENCOUNTER — Ambulatory Visit: Admitting: Physician Assistant
